# Patient Record
Sex: Female | Born: 1965
Health system: Southern US, Community
[De-identification: ages and names within clinical notes are randomized; demographics above are authoritative.]

## PROBLEM LIST (undated history)

## (undated) DIAGNOSIS — F329 Major depressive disorder, single episode, unspecified: Secondary | ICD-10-CM

## (undated) DIAGNOSIS — F32A Depression, unspecified: Secondary | ICD-10-CM

## (undated) DIAGNOSIS — D649 Anemia, unspecified: Secondary | ICD-10-CM

## (undated) DIAGNOSIS — I119 Hypertensive heart disease without heart failure: Secondary | ICD-10-CM

## (undated) DIAGNOSIS — I2129 ST elevation (STEMI) myocardial infarction involving other sites: Secondary | ICD-10-CM

## (undated) DIAGNOSIS — K5090 Crohn's disease, unspecified, without complications: Secondary | ICD-10-CM

## (undated) DIAGNOSIS — I10 Essential (primary) hypertension: Secondary | ICD-10-CM

## (undated) HISTORY — DX: Anemia, unspecified: D64.9

## (undated) HISTORY — DX: Essential (primary) hypertension: I10

## (undated) HISTORY — PX: RENAL ARTERY STENT: SHX2321

## (undated) HISTORY — PX: LAPAROSCOPY FOR ECTOPIC PREGNANCY: SUR765

## (undated) HISTORY — DX: Crohn's disease, unspecified, without complications: K50.90

---

## 1987-10-02 HISTORY — PX: TUBAL LIGATION: SHX77

## 2010-07-05 ENCOUNTER — Ambulatory Visit (HOSPITAL_BASED_OUTPATIENT_CLINIC_OR_DEPARTMENT_OTHER): Admission: RE | Admit: 2010-07-05 | Discharge: 2010-07-05 | Payer: Self-pay | Admitting: Unknown Physician Specialty

## 2010-07-05 ENCOUNTER — Ambulatory Visit: Payer: Self-pay | Admitting: Diagnostic Radiology

## 2010-10-01 DIAGNOSIS — I10 Essential (primary) hypertension: Secondary | ICD-10-CM

## 2010-10-01 HISTORY — DX: Essential (primary) hypertension: I10

## 2011-07-16 ENCOUNTER — Other Ambulatory Visit (HOSPITAL_BASED_OUTPATIENT_CLINIC_OR_DEPARTMENT_OTHER): Payer: Self-pay | Admitting: Unknown Physician Specialty

## 2011-07-16 DIAGNOSIS — Z1231 Encounter for screening mammogram for malignant neoplasm of breast: Secondary | ICD-10-CM

## 2011-07-19 ENCOUNTER — Ambulatory Visit (HOSPITAL_BASED_OUTPATIENT_CLINIC_OR_DEPARTMENT_OTHER)
Admission: RE | Admit: 2011-07-19 | Discharge: 2011-07-19 | Disposition: A | Discharge status: Home / Self Care | Payer: Self-pay | Source: Ambulatory Visit | Attending: Unknown Physician Specialty | Admitting: Unknown Physician Specialty

## 2011-07-19 DIAGNOSIS — Z1231 Encounter for screening mammogram for malignant neoplasm of breast: Secondary | ICD-10-CM | POA: Insufficient documentation

## 2011-10-02 DIAGNOSIS — D649 Anemia, unspecified: Secondary | ICD-10-CM

## 2011-10-02 HISTORY — PX: ENDOMETRIAL ABLATION: SHX621

## 2011-10-02 HISTORY — DX: Anemia, unspecified: D64.9

## 2011-10-17 ENCOUNTER — Encounter: Payer: Self-pay | Admitting: Obstetrics & Gynecology

## 2012-01-30 ENCOUNTER — Ambulatory Visit (INDEPENDENT_AMBULATORY_CARE_PROVIDER_SITE_OTHER): Payer: Self-pay | Admitting: Obstetrics & Gynecology

## 2012-01-30 ENCOUNTER — Other Ambulatory Visit (HOSPITAL_COMMUNITY)
Admission: RE | Admit: 2012-01-30 | Discharge: 2012-01-30 | Disposition: A | Discharge status: Home / Self Care | Payer: Self-pay | Source: Ambulatory Visit | Attending: Obstetrics & Gynecology | Admitting: Obstetrics & Gynecology

## 2012-01-30 ENCOUNTER — Encounter: Payer: Self-pay | Admitting: Obstetrics & Gynecology

## 2012-01-30 VITALS — BP 121/85 | HR 81 | Temp 97.5°F | Ht 67.0 in | Wt 159.0 lb

## 2012-01-30 DIAGNOSIS — N939 Abnormal uterine and vaginal bleeding, unspecified: Secondary | ICD-10-CM

## 2012-01-30 DIAGNOSIS — Z0000 Encounter for general adult medical examination without abnormal findings: Secondary | ICD-10-CM

## 2012-01-30 DIAGNOSIS — A499 Bacterial infection, unspecified: Secondary | ICD-10-CM

## 2012-01-30 DIAGNOSIS — N760 Acute vaginitis: Secondary | ICD-10-CM

## 2012-01-30 DIAGNOSIS — D219 Benign neoplasm of connective and other soft tissue, unspecified: Secondary | ICD-10-CM

## 2012-01-30 DIAGNOSIS — N920 Excessive and frequent menstruation with regular cycle: Secondary | ICD-10-CM

## 2012-01-30 DIAGNOSIS — Z01812 Encounter for preprocedural laboratory examination: Secondary | ICD-10-CM

## 2012-01-30 DIAGNOSIS — D259 Leiomyoma of uterus, unspecified: Secondary | ICD-10-CM

## 2012-01-30 DIAGNOSIS — N921 Excessive and frequent menstruation with irregular cycle: Secondary | ICD-10-CM

## 2012-01-30 DIAGNOSIS — B9689 Other specified bacterial agents as the cause of diseases classified elsewhere: Secondary | ICD-10-CM

## 2012-01-30 DIAGNOSIS — N926 Irregular menstruation, unspecified: Secondary | ICD-10-CM | POA: Insufficient documentation

## 2012-01-30 DIAGNOSIS — N898 Other specified noninflammatory disorders of vagina: Secondary | ICD-10-CM

## 2012-01-30 HISTORY — DX: Benign neoplasm of connective and other soft tissue, unspecified: D21.9

## 2012-01-30 HISTORY — DX: Abnormal uterine and vaginal bleeding, unspecified: N93.9

## 2012-01-30 LAB — CBC
HCT: 38.3 % (ref 36.0–46.0)
Hemoglobin: 12.4 g/dL (ref 12.0–15.0)
MCHC: 32.4 g/dL (ref 30.0–36.0)
MCV: 86.1 fL (ref 78.0–100.0)
Platelets: 403 10*3/uL — ABNORMAL HIGH (ref 150–400)
RBC: 4.45 MIL/uL (ref 3.87–5.11)
RDW: 13.7 % (ref 11.5–15.5)

## 2012-01-30 MED ORDER — MEDROXYPROGESTERONE ACETATE 10 MG PO TABS: 20.0000 mg | ORAL_TABLET | Freq: Every day | ORAL | Status: DC

## 2012-01-30 NOTE — Patient Instructions (Addendum)
Ms. Juliane Lack,  Thank you very much for coming in to today. Please do the following: 1. Get uterine ultrasound done 2. Start provera on the first day of your next menstrual period.   Please f/u to review biopsy and ultrasound results.   Drs. Dilynn Munroe and Anyanwu

## 2012-01-30 NOTE — Assessment & Plan Note (Signed)
See A/P for above.

## 2012-01-30 NOTE — Assessment & Plan Note (Addendum)
A: The patient has menorrhagia and and intermentstrual bleeding in the setting of uterine fibroids.  P: -await biopsy results -pelvic and transvaginal ultrasound to evaluate uterine lining and fibroids.  -check Hgb/Hct and TSH -Provera at beginning of next cycle to lighten bleeding -f/u in 1-2 months to review labs and studies and discuss treatment plan.

## 2012-01-30 NOTE — Assessment & Plan Note (Signed)
Vaginal discharge associated with itching. Wet prep done. Results pending.

## 2012-01-30 NOTE — Progress Notes (Signed)
Patient ID: Melody Gardner, female   DOB: 05-26-1966, 46 y.o.   MRN: [id number] Subjective:     Melody Gardner is a 46 y.o. woman who presents for evaluation of  irregular menses and uterine fibroids. She was diagnosed with uterine fibroids in 46/2012. Since then she hs been on iron therapy  She presents for evaluation today because of worsening cramping associated with intermenstrual bleeding for the past four months. Patient's last menstrual period was 01/20/2012.  Periods are regular every 28-30 days, lasting 5-10 days.  Dysmenorrhea:none. Cyclic symptoms include: bloating, pelvic pain and cramping all occur with intermenstraul spotting. Cramping is 8/10 in severity occuring in the bilateral lower abdomen and low back.  She last had her hemoglobin checked one month ago. To her knowledge she has never has her TSH checked. Her last pap as 11/2010. She has no history of abnormal pap smears. Current contraception: tubal ligation.History of infertility: no. History of abnormal Pap smear: no.  Past Medical History  Diagnosis Date  . Hypertension 2012  . Anemia 2013   Past Surgical History  Procedure Date  . Tubal ligation   . Renal artery stent   . Laparoscopy for ectopic pregnancy 1989    Review of Systems Pertinent items are noted in HPI.  Denies: fever, chills, weight loss, shortness of breath and palpitations. Admits: fatigue, presyncope.    Objective:    BP 121/85  Pulse 81  Temp(Src) 97.5 F (36.4 C) (Oral)  Ht 5' 7"  (1.702 m)  Wt 159 lb (72.122 kg)  BMI 24.90 kg/m2  LMP 01/20/2012 General appearance: alert, cooperative and no distress Neck: no adenopathy, no JVD, supple, symmetrical, trachea midline and thyroid not enlarged, symmetric, no tenderness/mass/nodules Lungs: clear to auscultation bilaterally Heart: regular rate and rhythm, S1, S2 normal, no murmur, click, rub or gallop Abdomen: soft, non-tender; bowel sounds normal; no masses,  no organomegaly and discomfort to  palapation over midline lower abdomen.  Pelvic: cervix normal in appearance, external genitalia normal, no adnexal masses or tenderness and thin white vaginal discharge noted on pelvic exam.  Neurologic: Grossly normal   Urine pregnancy test: negative.     After fully reviewing the procedure with the patient and written informed consent was obtained an endometrial biopsy was performed by Dr. Harolyn Rutherford.    ENDOMETRIAL BIOPSY     The indications for endometrial biopsy were reviewed.   Risks of the biopsy including cramping, bleeding, infection, uterine perforation, inadequate specimen and need for additional procedures  were discussed. The patient states she understands and agrees to undergo procedure today. Consent was signed. Time out was performed. Urine HCG was negative. A sterile speculum was placed in the patient's vagina and the cervix was prepped with Betadine. A single-toothed tenaculum was placed on the anterior lip of the cervix to stabilize it. The 3 mm pipelle was introduced into the endometrial cavity without difficulty to a depth of 10cm, and a moderate amount of tissue was obtained and sent to pathology. The instruments were removed from the patient's vagina. Minimal bleeding from the cervix was noted. The patient tolerated the procedure well. Routine post-procedure instructions were given to the patient. The patient will follow up to review the results and for further management.     Assessment and Plan:    The patient has menorrhagia and and intermentstrual bleeding in the setting of uterine fibroids..    Plan:   -await biopsy results -pelvic and transvaginal ultrasound to evaluate uterine lining and fibroids.  -check Hgb/Hct and TSH -  Provera at beginning of next cycle to lighten bleeding -f/u in 1-2 months to review labs and studies and discuss treatment plan. \  Agree with above note by Dr. Adrian Blackwater. I was present and performed the endometrial biopsy.  Verita Schneiders,  M.D. 01/30/2012 3:33 PM

## 2012-01-30 NOTE — Assessment & Plan Note (Signed)
See A/P for above

## 2012-01-31 ENCOUNTER — Encounter: Payer: Self-pay | Admitting: Obstetrics & Gynecology

## 2012-01-31 ENCOUNTER — Telehealth: Payer: Self-pay | Admitting: Obstetrics and Gynecology

## 2012-01-31 ENCOUNTER — Ambulatory Visit (HOSPITAL_COMMUNITY): Admission: RE | Admit: 2012-01-31 | Payer: Self-pay | Source: Ambulatory Visit

## 2012-01-31 DIAGNOSIS — N840 Polyp of corpus uteri: Secondary | ICD-10-CM | POA: Insufficient documentation

## 2012-01-31 HISTORY — DX: Polyp of corpus uteri: N84.0

## 2012-01-31 LAB — WET PREP, GENITAL: Trich, Wet Prep: NONE SEEN

## 2012-01-31 MED ORDER — METRONIDAZOLE 500 MG PO TABS: 500.0000 mg | ORAL_TABLET | Freq: Two times a day (BID) | ORAL | Status: AC

## 2012-01-31 NOTE — Progress Notes (Signed)
Called pt today @ 1120 and left message on her personal voice mail of test results and treatment indicated.  Pt was requested to call back with name and location of her pharmacy so that we may send the Rx as ordered.

## 2012-01-31 NOTE — Progress Notes (Signed)
Addended by: Verita Schneiders A on: 01/31/2012 11:15 AM   Modules accepted: Orders

## 2012-01-31 NOTE — Telephone Encounter (Signed)
Patient called and requested Flagyl for her BV to be called in to [facility] in [place] [phone]). Rx called in and patient notified and agreed.

## 2012-01-31 NOTE — Progress Notes (Signed)
Wet prep showed moderate clue cells consistent with BV.  Metronidazole will be called in for patient.

## 2012-02-05 ENCOUNTER — Ambulatory Visit (HOSPITAL_COMMUNITY)
Admission: RE | Admit: 2012-02-05 | Discharge: 2012-02-05 | Disposition: A | Discharge status: Home / Self Care | Payer: Self-pay | Source: Ambulatory Visit | Attending: Obstetrics & Gynecology | Admitting: Obstetrics & Gynecology

## 2012-02-05 DIAGNOSIS — N920 Excessive and frequent menstruation with regular cycle: Secondary | ICD-10-CM | POA: Insufficient documentation

## 2012-02-05 DIAGNOSIS — N938 Other specified abnormal uterine and vaginal bleeding: Secondary | ICD-10-CM | POA: Insufficient documentation

## 2012-02-05 DIAGNOSIS — D219 Benign neoplasm of connective and other soft tissue, unspecified: Secondary | ICD-10-CM

## 2012-02-05 DIAGNOSIS — N949 Unspecified condition associated with female genital organs and menstrual cycle: Secondary | ICD-10-CM | POA: Insufficient documentation

## 2012-02-05 DIAGNOSIS — D252 Subserosal leiomyoma of uterus: Secondary | ICD-10-CM | POA: Insufficient documentation

## 2012-02-08 ENCOUNTER — Ambulatory Visit: Payer: Self-pay | Admitting: Obstetrics & Gynecology

## 2012-03-13 ENCOUNTER — Encounter: Payer: Self-pay | Admitting: Obstetrics & Gynecology

## 2012-03-13 ENCOUNTER — Ambulatory Visit (INDEPENDENT_AMBULATORY_CARE_PROVIDER_SITE_OTHER): Payer: Self-pay | Admitting: Obstetrics & Gynecology

## 2012-03-13 VITALS — BP 123/88 | HR 77 | Temp 98.0°F | Ht 67.0 in | Wt 153.6 lb

## 2012-03-13 DIAGNOSIS — D219 Benign neoplasm of connective and other soft tissue, unspecified: Secondary | ICD-10-CM

## 2012-03-13 DIAGNOSIS — N920 Excessive and frequent menstruation with regular cycle: Secondary | ICD-10-CM

## 2012-03-13 DIAGNOSIS — D259 Leiomyoma of uterus, unspecified: Secondary | ICD-10-CM

## 2012-03-13 MED ORDER — IBUPROFEN 600 MG PO TABS: 600.0000 mg | ORAL_TABLET | Freq: Four times a day (QID) | ORAL | Status: AC | PRN

## 2012-03-13 MED ORDER — MEDROXYPROGESTERONE ACETATE 10 MG PO TABS: 20.0000 mg | ORAL_TABLET | Freq: Every day | ORAL | Status: DC

## 2012-03-13 NOTE — Patient Instructions (Addendum)
Endometrial Ablation Endometrial ablation removes the lining of the uterus (endometrium). It is usually a same day, outpatient treatment. Ablation helps avoid major surgery (such as a hysterectomy). A hysterectomy is removal of the cervix and uterus. Endometrial ablation has less risk and complications, has a shorter recovery period and is less expensive. After endometrial ablation, most women will have little or no menstrual bleeding. You may not keep your fertility. Pregnancy is no longer likely after this procedure but if you are pre-menopausal, you still need to use a reliable method of birth control following the procedure because pregnancy can occur. REASONS TO HAVE THE PROCEDURE MAY INCLUDE:  Heavy periods.   Bleeding that is causing anemia.   Anovulatory bleeding, very irregular, bleeding.   Bleeding submucous fibroids (on the lining inside the uterus) if they are smaller than 3 centimeters.  REASONS NOT TO HAVE THE PROCEDURE MAY INCLUDE:  You wish to have more children.   You have a pre-cancerous or cancerous problem. The cause of any abnormal bleeding must be diagnosed before having the procedure.   You have pain coming from the uterus.   You have a submucus fibroid larger than 3 centimeters.   You recently had a baby.   You recently had an infection in the uterus.   You have a severe retro-flexed, tipped uterus and cannot insert the instrument to do the ablation.   You had a Cesarean section or deep major surgery on the uterus.   The inner cavity of the uterus is too large for the endometrial ablation instrument.  RISKS AND COMPLICATIONS   Perforation of the uterus.   Bleeding.   Infection of the uterus, bladder or vagina.   Injury to surrounding organs.   Cutting the cervix.   An air bubble to the lung (air embolus).   Pregnancy following the procedure.   Failure of the procedure to help the problem requiring hysterectomy.   Decreased ability to diagnose  cancer in the lining of the uterus.  BEFORE THE PROCEDURE  The lining of the uterus must be tested to make sure there is no pre-cancerous or cancer cells present.   Medications may be given to make the lining of the uterus thinner.   Ultrasound may be used to evaluate the size and look for abnormalities of the uterus.   Future pregnancy is not desired.  PROCEDURE  There are different ways to destroy the lining of the uterus.   Resectoscope - radio frequency-alternating electric current is the most common one used.   Cryotherapy - freezing the lining of the uterus.   Heated Free Liquid - heated salt (saline) solution inserted into the uterus.   Microwave - uses high energy microwaves in the uterus.   Thermal Balloon - a catheter with a balloon tip is inserted into the uterus and filled with heated fluid.  Your caregiver will talk with you about the method used in this clinic. They will also instruct you on the pros and cons of the procedure. Endometrial ablation is performed along with a procedure called operative hysteroscopy. A narrow viewing tube is inserted through the birth canal (vagina) and through the cervix into the uterus. A tiny camera attached to the viewing tube (hysteroscope) allows the uterine cavity to be shown on a TV monitor during surgery. Your uterus is filled with a harmless liquid to make the procedure easier. The lining of the uterus is then removed. The lining can also be removed with a resectoscope which allows your surgeon  to cut away the lining of the uterus under direct vision. Usually, you will be able to go home within an hour after the procedure. HOME CARE INSTRUCTIONS   Do not drive for 24 hours.   No tampons, douching or intercourse for 2 weeks or until your caregiver approves.   Rest at home for 24 to 48 hours. You may then resume normal activities unless told differently by your caregiver.   Take your temperature two times a day for 4 days, and record  it.   Take any medications your caregiver has ordered, as directed.   Use some form of contraception if you are pre-menopausal and do not want to get pregnant.  Bleeding after the procedure is normal. It varies from light spotting and mildly watery to bloody discharge for 4 to 6 weeks. You may also have mild cramping. Only take over-the-counter or prescription medicines for pain, discomfort, or fever as directed by your caregiver. Do not use aspirin, as this may aggravate bleeding. Frequent urination during the first 24 hours is normal. You will not know how effective your surgery is until at least 3 months after the surgery. SEEK IMMEDIATE MEDICAL CARE IF:   Bleeding is heavier than a normal menstrual cycle.   An oral temperature above 102 F (38.9 C) develops.   You have increasing cramps or pains not relieved with medication or develop belly (abdominal) pain which does not seem to be related to the same area of earlier cramping and pain.   You are light headed, weak or have fainting episodes.   You develop pain in the shoulder strap areas.   You have chest or leg pain.   You have abnormal vaginal discharge.   You have painful urination.  Document Released: 07/27/2004 Document Revised: 09/06/2011 Document Reviewed: 10/25/2007 [facility] Patient Information 2012 [place].

## 2012-03-13 NOTE — Progress Notes (Signed)
History:  46 y.o. [name] here today for discussion about management of AUB. She had a benign endometrial biopsy on 01/30/12, and had an ultrasound that showed fibroids, normal endometrial stripe. Small amount of bleeding since biopsy, denies pain. She was using Provera during her period and wants a refill, also wants prescription-strength Ibuprofen.  The following portions of the patient's history were reviewed and updated as appropriate: allergies, current medications, past family history, past medical history, past social history, past surgical history and problem list.  Review of Systems:  Pertinent items are noted in HPI.  Objective:  Physical Exam Blood pressure 123/88, pulse 77, temperature 98 F (36.7 C), temperature source Oral, height 5' 7"  (1.702 m), weight 153 lb 9.6 oz (69.673 kg), last menstrual period 01/30/2012. Deferred  Labs and Imaging 01/30/2012 Endometrium, biopsy: BENIGN ENDOMETRIAL TYPE POLYP(S). THERE IS NO EVIDENCE OF HYPERPLASIA OR MALIGNANCY. TSH 1.7, Hgb 12.4  02/05/2012 TRANSABDOMINAL AND TRANSVAGINAL ULTRASOUND OF PELVIS  Findings: The uterus is mildly enlarged and lobular in contour, measuring 10.5 x 5.6 x 8.5 cm. There is a broad-based subserosal fibroid at the right fundus which measures 5.3 cm. There is a left fundal myometrial fibroid that measures 2.4 cm. The endometrial stripe is homogeneous and upper limits of normal in width, measuring 13 mm. Both ovaries have a normal size and appearance. The right ovary measures 2.4 x 1.6 x 2.0 cm, and the left ovary measures 2.8 x 1.9 x 2.1 cm. There are no adnexal masses or free pelvic fluid.   IMPRESSION: Fibroid uterus, as described above. The endometrial stripe is homogeneous and upper limits of normal in width.   Assessment & Plan:  Discussed management options for abnormal uterine bleeding including oral Provera, Depo Provera, Mirena IUD, endometrial ablation (Novasure/Hydrothermal Ablation/Thermachoice) or hysterectomy  as definitive surgical management.  Discussed risks and benefits of each method.  Patient desires endometrial ablation.  Differences between ablation modalities explained, recommended Hydrothermal Endometrial Ablation.  Risks of surgery were discussed with the patient including but not limited to: bleeding which may require transfusion; infection which may require antibiotics; injury to uterus leading to risk of injury to surrounding intraperitoneal organs, burn injury to vagina or other organs, need for additional procedures including laparoscopy or laparotomy, and other postoperative/anesthesia complications. Patient was informed that there is a high likelihood of success of controlling her symptoms; howeverabout 5% of patients may require further intervention.  All questions were answered.  She was told that she will be contacted by our surgical scheduler regarding the time and date of her surgery; routine preoperative instructions of having nothing to eat or drink after midnight on the day prior to surgery and also coming to the hospital 1 1/2 hours prior to her time of surgery were also emphasized.  She was told she may be called for a preoperative appointment about a week prior to surgery and will be given further preoperative instructions at that visit. Printed patient education handouts about the procedure were given to the patient to review at home.   She was prescribed Ibuprofena and Provera, bleeding precautions reviewed.

## 2012-03-21 ENCOUNTER — Encounter (HOSPITAL_COMMUNITY): Payer: Self-pay | Admitting: Pharmacist

## 2012-03-26 ENCOUNTER — Encounter (HOSPITAL_COMMUNITY)
Admission: RE | Admit: 2012-03-26 | Discharge: 2012-03-26 | Disposition: A | Discharge status: Home / Self Care | Payer: Self-pay | Source: Ambulatory Visit | Attending: Obstetrics & Gynecology | Admitting: Obstetrics & Gynecology

## 2012-03-26 ENCOUNTER — Encounter (HOSPITAL_COMMUNITY): Payer: Self-pay

## 2012-03-26 HISTORY — DX: Depression, unspecified: F32.A

## 2012-03-26 HISTORY — DX: Major depressive disorder, single episode, unspecified: F32.9

## 2012-03-26 LAB — CBC
Hemoglobin: 12.1 g/dL (ref 12.0–15.0)
MCH: 28.3 pg (ref 26.0–34.0)
MCHC: 32.6 g/dL (ref 30.0–36.0)
MCV: 86.9 fL (ref 78.0–100.0)
Platelets: 413 10*3/uL — ABNORMAL HIGH (ref 150–400)

## 2012-03-26 LAB — BASIC METABOLIC PANEL
Calcium: 9.8 mg/dL (ref 8.4–10.5)
Creatinine, Ser: 0.67 mg/dL (ref 0.50–1.10)
GFR calc non Af Amer: >90 mL/min (ref >90)
Glucose, Bld: 79 mg/dL (ref 70–99)
Sodium: 137 mEq/L (ref 135–145)

## 2012-03-26 NOTE — Patient Instructions (Addendum)
YOUR PROCEDURE IS SCHEDULED ON:03/31/12  [place] [facility]TR[facility] AT:9am  USE DESK PHONE AND DIAL [phone] TO INFORM us OF YOUR ARRIVAL  CALL [phone] IF YOU HAVE ANY QUESTIONS OR PROBLEMS PRIOR TO YOUR ARRIVAL.  REMEMBER: DO NOT EAT OR DRINK AFTER MIDNIGHT :Sunday   YOU MAY BRUSH YOUR TEETH THE MORNING OF SURGERY   TAKE THESE MEDICINES THE DAY OF SURGERY WITH SIP OF WATER:BP meds   DO NOT WEAR JEWELRY, EYE MAKEUP, LIPSTICK OR DARK FINGERNAIL POLISH DO NOT WEAR LOTIONS  DO NOT SHAVE FOR 2 HOURS PRIOR TO SURGERY  YOU WILL NOT BE ALLOWED TO [place].

## 2012-03-31 ENCOUNTER — Encounter (HOSPITAL_COMMUNITY)
Admission: RE | Disposition: A | Discharge status: Home / Self Care | Payer: Self-pay | Source: Ambulatory Visit | Attending: Obstetrics & Gynecology

## 2012-03-31 ENCOUNTER — Encounter (HOSPITAL_COMMUNITY): Payer: Self-pay | Admitting: Anesthesiology

## 2012-03-31 ENCOUNTER — Ambulatory Visit (HOSPITAL_COMMUNITY)
Admission: RE | Admit: 2012-03-31 | Discharge: 2012-03-31 | Disposition: A | Discharge status: Home / Self Care | Payer: Self-pay | Source: Ambulatory Visit | Attending: Obstetrics & Gynecology | Admitting: Obstetrics & Gynecology

## 2012-03-31 ENCOUNTER — Ambulatory Visit (HOSPITAL_COMMUNITY): Payer: Self-pay | Admitting: Anesthesiology

## 2012-03-31 DIAGNOSIS — Z01818 Encounter for other preprocedural examination: Secondary | ICD-10-CM | POA: Insufficient documentation

## 2012-03-31 DIAGNOSIS — Z9889 Other specified postprocedural states: Secondary | ICD-10-CM

## 2012-03-31 DIAGNOSIS — N938 Other specified abnormal uterine and vaginal bleeding: Secondary | ICD-10-CM | POA: Insufficient documentation

## 2012-03-31 DIAGNOSIS — D259 Leiomyoma of uterus, unspecified: Secondary | ICD-10-CM

## 2012-03-31 DIAGNOSIS — D219 Benign neoplasm of connective and other soft tissue, unspecified: Secondary | ICD-10-CM | POA: Diagnosis present

## 2012-03-31 DIAGNOSIS — N840 Polyp of corpus uteri: Secondary | ICD-10-CM | POA: Diagnosis present

## 2012-03-31 DIAGNOSIS — N939 Abnormal uterine and vaginal bleeding, unspecified: Secondary | ICD-10-CM | POA: Diagnosis present

## 2012-03-31 DIAGNOSIS — Z01812 Encounter for preprocedural laboratory examination: Secondary | ICD-10-CM | POA: Insufficient documentation

## 2012-03-31 DIAGNOSIS — N949 Unspecified condition associated with female genital organs and menstrual cycle: Secondary | ICD-10-CM | POA: Insufficient documentation

## 2012-03-31 DIAGNOSIS — N926 Irregular menstruation, unspecified: Secondary | ICD-10-CM

## 2012-03-31 HISTORY — DX: Other specified postprocedural states: Z98.890

## 2012-03-31 LAB — PREGNANCY, URINE: Preg Test, Ur: NEGATIVE

## 2012-03-31 SURGERY — DILATATION & CURETTAGE/HYSTEROSCOPY WITH HYDROTHERMAL ABLATION
Anesthesia: General | Site: Uterus | Wound class: Clean Contaminated

## 2012-03-31 MED ORDER — MEPERIDINE HCL 25 MG/ML IJ SOLN: 6.2500 mg | INTRAMUSCULAR | Status: DC | PRN

## 2012-03-31 MED ORDER — SODIUM CHLORIDE 0.9 % IR SOLN
Status: DC | PRN
  Administered 2012-03-31: 3000 mL

## 2012-03-31 MED ORDER — IBUPROFEN 600 MG PO TABS: 600.0000 mg | ORAL_TABLET | Freq: Four times a day (QID) | ORAL | Status: AC | PRN

## 2012-03-31 MED ORDER — FENTANYL CITRATE 0.05 MG/ML IJ SOLN
INTRAMUSCULAR | Status: AC
  Filled 2012-03-31: qty 2

## 2012-03-31 MED ORDER — LIDOCAINE HCL (CARDIAC) 20 MG/ML IV SOLN
INTRAVENOUS | Status: AC
  Filled 2012-03-31: qty 5

## 2012-03-31 MED ORDER — PROPOFOL 10 MG/ML IV EMUL
INTRAVENOUS | Status: DC | PRN
  Administered 2012-03-31: 200 mg via INTRAVENOUS

## 2012-03-31 MED ORDER — PROPOFOL 10 MG/ML IV EMUL
INTRAVENOUS | Status: AC
  Filled 2012-03-31: qty 20

## 2012-03-31 MED ORDER — MIDAZOLAM HCL 2 MG/2ML IJ SOLN
INTRAMUSCULAR | Status: AC
  Filled 2012-03-31: qty 2

## 2012-03-31 MED ORDER — FENTANYL CITRATE 0.05 MG/ML IJ SOLN
INTRAMUSCULAR | Status: DC | PRN
  Administered 2012-03-31 (×2): 50 ug via INTRAVENOUS

## 2012-03-31 MED ORDER — BUPIVACAINE HCL (PF) 0.5 % IJ SOLN
INTRAMUSCULAR | Status: AC
  Filled 2012-03-31: qty 30

## 2012-03-31 MED ORDER — OXYCODONE-ACETAMINOPHEN 5-325 MG PO TABS: 1.0000 | ORAL_TABLET | Freq: Four times a day (QID) | ORAL | Status: AC | PRN

## 2012-03-31 MED ORDER — OXYCODONE-ACETAMINOPHEN 5-325 MG PO TABS
1.0000 | ORAL_TABLET | ORAL | Status: DC | PRN
  Administered 2012-03-31: 1 via ORAL

## 2012-03-31 MED ORDER — ONDANSETRON HCL 4 MG/2ML IJ SOLN
INTRAMUSCULAR | Status: DC | PRN
  Administered 2012-03-31: 4 mg via INTRAVENOUS

## 2012-03-31 MED ORDER — OXYCODONE-ACETAMINOPHEN 5-325 MG PO TABS
ORAL_TABLET | ORAL | Status: AC
  Administered 2012-03-31: 1 via ORAL
  Filled 2012-03-31: qty 1

## 2012-03-31 MED ORDER — LIDOCAINE HCL (CARDIAC) 20 MG/ML IV SOLN
INTRAVENOUS | Status: DC | PRN
  Administered 2012-03-31: 50 mg via INTRAVENOUS

## 2012-03-31 MED ORDER — ONDANSETRON HCL 4 MG/2ML IJ SOLN
INTRAMUSCULAR | Status: AC
  Filled 2012-03-31: qty 2

## 2012-03-31 MED ORDER — KETOROLAC TROMETHAMINE 30 MG/ML IJ SOLN
INTRAMUSCULAR | Status: AC
  Filled 2012-03-31: qty 1

## 2012-03-31 MED ORDER — LACTATED RINGERS IV SOLN
INTRAVENOUS | Status: DC
  Administered 2012-03-31: 10:00:00 via INTRAVENOUS

## 2012-03-31 MED ORDER — FENTANYL CITRATE 0.05 MG/ML IJ SOLN
INTRAMUSCULAR | Status: AC
  Administered 2012-03-31: 50 ug via INTRAVENOUS
  Filled 2012-03-31: qty 2

## 2012-03-31 MED ORDER — DOCUSATE SODIUM 100 MG PO CAPS: 100.0000 mg | ORAL_CAPSULE | Freq: Two times a day (BID) | ORAL | Status: AC | PRN

## 2012-03-31 MED ORDER — BUPIVACAINE HCL 0.5 % IJ SOLN
INTRAMUSCULAR | Status: DC | PRN
  Administered 2012-03-31: 30 mL

## 2012-03-31 MED ORDER — FENTANYL CITRATE 0.05 MG/ML IJ SOLN
25.0000 ug | INTRAMUSCULAR | Status: DC | PRN
  Administered 2012-03-31: 50 ug via INTRAVENOUS

## 2012-03-31 MED ORDER — BUPIVACAINE HCL (PF) 0.25 % IJ SOLN
INTRAMUSCULAR | Status: AC
  Filled 2012-03-31: qty 30

## 2012-03-31 MED ORDER — METOCLOPRAMIDE HCL 5 MG/ML IJ SOLN: 10.0000 mg | Freq: Once | INTRAMUSCULAR | Status: DC | PRN

## 2012-03-31 MED ORDER — MIDAZOLAM HCL 5 MG/5ML IJ SOLN
INTRAMUSCULAR | Status: DC | PRN
  Administered 2012-03-31: 2 mg via INTRAVENOUS

## 2012-03-31 MED ORDER — KETOROLAC TROMETHAMINE 30 MG/ML IJ SOLN
INTRAMUSCULAR | Status: DC | PRN
  Administered 2012-03-31: 30 mg via INTRAVENOUS

## 2012-03-31 SURGICAL SUPPLY — 18 items
CATH ROBINSON RED A/P 16FR (CATHETERS) ×2 IMPLANT
CLOTH BEACON ORANGE TIMEOUT ST (SAFETY) ×2 IMPLANT
CONTAINER PREFILL 10% NBF 60ML (FORM) ×4 IMPLANT
DRAPE HYSTEROSCOPY (DRAPE) ×2 IMPLANT
GLOVE BIO SURGEON STRL SZ7 (GLOVE) ×8 IMPLANT
GLOVE BIOGEL PI IND STRL 6.5 (GLOVE) ×1 IMPLANT
GLOVE BIOGEL PI IND STRL 7.5 (GLOVE) ×1 IMPLANT
GLOVE BIOGEL PI INDICATOR 6.5 (GLOVE) ×1
GLOVE BIOGEL PI INDICATOR 7.5 (GLOVE) ×1
GOWN PREVENTION PLUS LG XLONG (DISPOSABLE) ×2 IMPLANT
GOWN STRL REIN XL XLG (GOWN DISPOSABLE) ×2 IMPLANT
NEEDLE SPNL 20GX3.5 QUINCKE YW (NEEDLE) ×2 IMPLANT
PACK VAGINAL MINOR WOMEN LF (CUSTOM PROCEDURE TRAY) ×2 IMPLANT
PAD OB MATERNITY 4.3X12.25 (PERSONAL CARE ITEMS) ×4 IMPLANT
SET GENESYS HTA PROCERVA (MISCELLANEOUS) ×2 IMPLANT
SYR CONTROL 10ML LL (SYRINGE) ×2 IMPLANT
TOWEL OR 17X24 6PK STRL BLUE (TOWEL DISPOSABLE) ×4 IMPLANT
WATER STERILE IRR 1000ML POUR (IV SOLUTION) ×2 IMPLANT

## 2012-03-31 NOTE — Op Note (Signed)
PREOPERATIVE DIAGNOSIS:  Abnormal uterine bleeding POSTOPERATIVE DIAGNOSIS: The same PROCEDURE: Dilation and Curettage, Hysteroscopy, Hydrothermal Endometrial Ablation SURGEON:  Dr. Verita Schneiders  INDICATIONS: 46 y.o. [id number] here for scheduled surgery for abnormal uterine bleeding. Risks of surgery were discussed with the patient including but not limited to: bleeding which may require transfusion; infection which may require antibiotics; injury to uterus leading to risk of injury to surrounding intraperitoneal organs, burn injury to vagina or other organs, need for additional procedures including laparoscopy or laparotomy, and other postoperative/anesthesia complications. Patient was informed that there is a high likelihood of success of controlling her symptoms; however about 5% of patients may require further intervention.  Written informed consent was obtained.    FINDINGS:  A 11 week size uterus.  Diffuse proliferative endometrium and polypoid tissue.  Normal ostia bilaterally.  ANESTHESIA:   General, paracervical block. INTRAVENOUS FLUIDS:  300 ml of LR ESTIMATED BLOOD LOSS:  10 ml SPECIMENS: Endometrial curettings sent to pathology COMPLICATIONS:  None immediate.  PROCEDURE DETAILS:  The patient was then taken to the operating room where general anesthesia was administered and was found to be adequate.  After an adequate timeout was performed, she was placed in the dorsal lithotomy position and examined; then prepped and draped in the sterile manner.   Her bladder was catheterized for clear, yellow urine. A speculum was then placed in the patient's vagina and a single tooth tenaculum was applied to the anterior lip of the cervix.   A paracervical block using 30 ml of 0.5% Marcaine was administered.  The cervix was sounded to 11 cm and dilated manually with metal dilators to accommodate the hydrothermal ablation hysteroscopic apparatus.  Once the cervix was dilated, a sharp curettage was then  performed to obtain a moderate amount of endometrial curettings.  The hysteroscope was inserted under direct visualization using normal saline as a suspension medium.  The uterine cavity was carefully examined, both ostia were recognized, and diffusely proliferative endometrium with polypoid tissue was noted.   The hydrothermal ablation was then carried out as per protocol.   Complete ablation of the endometrium was observed and the hysteroscope was removed under direct visualization. .  No complications were observed.  The tenaculum was removed from the anterior lip of the cervix, and the vaginal speculum was removed after noting good hemostasis.  The patient tolerated the procedure well and was taken to the recovery area awake, extubated and in stable condition.  The patient will be discharged to home as per PACU criteria.  Routine postoperative instructions given.  She was prescribed Percocet, Ibuprofen and Colace.  She will follow up in the clinic on 04/30/12  for postoperative evaluation.

## 2012-03-31 NOTE — H&P (View-Only) (Signed)
History:  46 y.o. [id number] here today for discussion about management of AUB. She had a benign endometrial biopsy on 01/30/12, and had an ultrasound that showed fibroids, normal endometrial stripe. Small amount of bleeding since biopsy, denies pain. She was using Provera during her period and wants a refill, also wants prescription-strength Ibuprofen.  The following portions of the patient's history were reviewed and updated as appropriate: allergies, current medications, past family history, past medical history, past social history, past surgical history and problem list.  Review of Systems:  Pertinent items are noted in HPI.  Objective:  Physical Exam Blood pressure 123/88, pulse 77, temperature 98 F (36.7 C), temperature source Oral, height 5' 7"  (1.702 m), weight 153 lb 9.6 oz (69.673 kg), last menstrual period 01/30/2012. Deferred  Labs and Imaging 01/30/2012 Endometrium, biopsy: BENIGN ENDOMETRIAL TYPE POLYP(S). THERE IS NO EVIDENCE OF HYPERPLASIA OR MALIGNANCY. TSH 1.7, Hgb 12.4  02/05/2012 TRANSABDOMINAL AND TRANSVAGINAL ULTRASOUND OF PELVIS  Findings: The uterus is mildly enlarged and lobular in contour, measuring 10.5 x 5.6 x 8.5 cm. There is a broad-based subserosal fibroid at the right fundus which measures 5.3 cm. There is a left fundal myometrial fibroid that measures 2.4 cm. The endometrial stripe is homogeneous and upper limits of normal in width, measuring 13 mm. Both ovaries have a normal size and appearance. The right ovary measures 2.4 x 1.6 x 2.0 cm, and the left ovary measures 2.8 x 1.9 x 2.1 cm. There are no adnexal masses or free pelvic fluid.   IMPRESSION: Fibroid uterus, as described above. The endometrial stripe is homogeneous and upper limits of normal in width.   Assessment & Plan:  Discussed management options for abnormal uterine bleeding including oral Provera, Depo Provera, Mirena IUD, endometrial ablation (Novasure/Hydrothermal Ablation/Thermachoice) or hysterectomy  as definitive surgical management.  Discussed risks and benefits of each method.  Patient desires endometrial ablation.  Differences between ablation modalities explained, recommended Hydrothermal Endometrial Ablation.  Risks of surgery were discussed with the patient including but not limited to: bleeding which may require transfusion; infection which may require antibiotics; injury to uterus leading to risk of injury to surrounding intraperitoneal organs, burn injury to vagina or other organs, need for additional procedures including laparoscopy or laparotomy, and other postoperative/anesthesia complications. Patient was informed that there is a high likelihood of success of controlling her symptoms; howeverabout 5% of patients may require further intervention.  All questions were answered.  She was told that she will be contacted by our surgical scheduler regarding the time and date of her surgery; routine preoperative instructions of having nothing to eat or drink after midnight on the day prior to surgery and also coming to the hospital 1 1/2 hours prior to her time of surgery were also emphasized.  She was told she may be called for a preoperative appointment about a week prior to surgery and will be given further preoperative instructions at that visit. Printed patient education handouts about the procedure were given to the patient to review at home.   She was prescribed Ibuprofena and Provera, bleeding precautions reviewed.

## 2012-03-31 NOTE — Anesthesia Postprocedure Evaluation (Signed)
Anesthesia Post Note  Patient: Melody Gardner  Procedure(s) Performed: Procedure(s) (LRB): DILATATION & CURETTAGE/HYSTEROSCOPY WITH HYDROTHERMAL ABLATION (N/A)  Anesthesia type: General  Patient location: PACU  Post pain: Pain level controlled  Post assessment: Post-op Vital signs reviewed  Last Vitals:  Filed Vitals:   03/31/12 1330  BP: 131/79  Pulse: 73  Temp:   Resp: 16    Post vital signs: Reviewed  Level of consciousness: sedated  Complications: No apparent anesthesia complicationsfj

## 2012-03-31 NOTE — Transfer of Care (Signed)
Immediate Anesthesia Transfer of Care Note  Patient: Melody Gardner  Procedure(s) Performed: Procedure(s) (LRB): DILATATION & CURETTAGE/HYSTEROSCOPY WITH HYDROTHERMAL ABLATION (N/A)  Patient Location: PACU  Anesthesia Type: General  Level of Consciousness: awake, alert  and oriented  Airway & Oxygen Therapy: Patient Spontanous Breathing and Patient connected to nasal cannula oxygen  Post-op Assessment: Report given to PACU RN and Post -op Vital signs reviewed and stable  Post vital signs: Reviewed and stable  Complications: No apparent anesthesia complications

## 2012-03-31 NOTE — Interval H&P Note (Signed)
History and Physical Interval Note  Melody Gardner  has presented today for surgery, with the diagnosis of abnormal uterine bleeding  The various methods of treatment have been discussed with the patient and family. After consideration of risks, benefits and other options for treatment, the patient has consented to  Procedure(s) (LRB): DILATATION & CURETTAGE/HYSTEROSCOPY WITH HYDROTHERMAL ABLATION as a surgical intervention .  The patient's history has been reviewed, patient examined, no change in status, stable for surgery.  I have reviewed the patients' chart and labs.  Questions were answered to the patient's satisfaction.  To the OR when ready.  Verita Schneiders, M.D. 03/31/2012 9:30 AM

## 2012-03-31 NOTE — Anesthesia Preprocedure Evaluation (Signed)
Anesthesia Evaluation  Patient identified by MRN, date of birth, ID band Patient awake    Reviewed: Allergy & Precautions, H&P , NPO status , Patient's Chart, lab work & pertinent test results  Airway Mallampati: II TM Distance: >3 FB Neck ROM: Full    Dental No notable dental hx. (+) Teeth Intact   Pulmonary  breath sounds clear to auscultation  Pulmonary exam normal       Cardiovascular hypertension, Pt. on medications Rhythm:Regular Rate:Normal  Hx/o Renal Artery Stenosis S/P stent placement   Neuro/Psych    GI/Hepatic negative GI ROS, Neg liver ROS,   Endo/Other  negative endocrine ROS  Renal/GU negative Renal ROS  negative genitourinary   Musculoskeletal negative musculoskeletal ROS (+)   Abdominal   Peds  Hematology negative hematology ROS (+)   Anesthesia Other Findings   Reproductive/Obstetrics negative OB ROS                           Anesthesia Physical Anesthesia Plan  ASA: II  Anesthesia Plan: General   Post-op Pain Management:    Induction: Intravenous  Airway Management Planned: LMA  Additional Equipment:   Intra-op Plan:   Post-operative Plan: Extubation in OR  Informed Consent: I have reviewed the patients History and Physical, chart, labs and discussed the procedure including the risks, benefits and alternatives for the proposed anesthesia with the patient or authorized representative who has indicated his/her understanding and acceptance.   Dental advisory given  Plan Discussed with: CRNA, Anesthesiologist and Surgeon  Anesthesia Plan Comments:         Anesthesia Quick Evaluation

## 2012-03-31 NOTE — Discharge Instructions (Signed)
Endometrial Ablation Endometrial ablation removes the lining of the uterus (endometrium). It is usually a same day, outpatient treatment. Ablation helps avoid major surgery (such as a hysterectomy). A hysterectomy is removal of the cervix and uterus. Endometrial ablation has less risk and complications, has a shorter recovery period and is less expensive. After endometrial ablation, most women will have little or no menstrual bleeding. You may not keep your fertility. Pregnancy is no longer likely after this procedure but if you are pre-menopausal, you still need to use a reliable method of birth control following the procedure because pregnancy can occur. REASONS TO HAVE THE PROCEDURE MAY INCLUDE:  Heavy periods.   Bleeding that is causing anemia.   Anovulatory bleeding, very irregular, bleeding.   Bleeding submucous fibroids (on the lining inside the uterus) if they are smaller than 3 centimeters.  REASONS NOT TO HAVE THE PROCEDURE MAY INCLUDE:  You wish to have more children.   You have a pre-cancerous or cancerous problem. The cause of any abnormal bleeding must be diagnosed before having the procedure.   You have pain coming from the uterus.   You have a submucus fibroid larger than 3 centimeters.   You recently had a baby.   You recently had an infection in the uterus.   You have a severe retro-flexed, tipped uterus and cannot insert the instrument to do the ablation.   You had a Cesarean section or deep major surgery on the uterus.   The inner cavity of the uterus is too large for the endometrial ablation instrument.  RISKS AND COMPLICATIONS   Perforation of the uterus.   Bleeding.   Infection of the uterus, bladder or vagina.   Injury to surrounding organs.   Cutting the cervix.   An air bubble to the lung (air embolus).   Pregnancy following the procedure.   Failure of the procedure to help the problem requiring hysterectomy.   Decreased ability to diagnose  cancer in the lining of the uterus.  PROCEDURE  Endometrial ablation is performed along with a procedure called operative hysteroscopy. A narrow viewing tube is inserted through the birth canal (vagina) and through the cervix into the uterus. A tiny camera attached to the viewing tube (hysteroscope) allows the uterine cavity to be shown on a TV monitor during surgery. Your uterus is filled with a harmless liquid to make the procedure easier. The lining of the uterus is then destroyed. Usually, you will be able to go home within an hour after the procedure. HOME CARE INSTRUCTIONS   Do not drive for 24 hours.   No tampons, douching or intercourse for 2 weeks or until your caregiver approves.   Rest at home for 24 to 48 hours. You may then resume normal activities unless told differently by your caregiver.   Take any medications your caregiver has ordered, as directed.   Use some form of contraception if you are pre-menopausal and do not want to get pregnant.  Bleeding after the procedure is normal. It varies from light spotting and mildly watery to bloody discharge for 4 to 6 weeks. You may also have mild cramping. Only take over-the-counter or prescription medicines for pain, discomfort, or fever as directed by your caregiver. Do not use aspirin, as this may aggravate bleeding. Frequent urination during the first 24 hours is normal. You will not know how effective your surgery is until at least 3 months after the surgery. SEEK IMMEDIATE MEDICAL CARE IF:   Bleeding is heavier than a  normal menstrual cycle.   An oral temperature above 102 F (38.9 C) develops.   You have increasing cramps or pains not relieved with medication or develop belly (abdominal) pain which does not seem to be related to the same area of earlier cramping and pain.   You are light headed, weak or have fainting episodes.   You develop pain in the shoulder strap areas.   You have chest or leg pain.   You have abnormal  vaginal discharge.   You have painful urination.  Document Released: 07/27/2004 Document Revised: 09/06/2011 Document Reviewed: 10/25/2007 ExitCare Patient Information 2012 ExitCare, LLCDISCHARGE INSTRUCTIONS: HYSTEROSCOPY / ENDOMETRIAL ABLATION The following instructions have been prepared to help you care for yourself upon your return home.  Personal hygiene: Marland Kitchen Use sanitary pads for vaginal drainage, not tampons. . Shower the day after your procedure. . NO tub baths, pools or Jacuzzis for 2-3 weeks. . Wipe front to back after using the bathroom.  Activity and limitations: . Do NOT drive or operate any equipment for 24 hours. The effects of anesthesia are still present and drowsiness may result. . Do NOT rest in bed all day. . Walking is encouraged. . Walk up and down stairs slowly. . You may resume your normal activity in one to two days or as indicated by your physician. Sexual activity: NO intercourse for at least 2 weeks after the procedure, or as indicated by your Doctor.  Diet: Eat a light meal as desired this evening. You may resume your usual diet tomorrow.  Return to Work: You may resume your work activities in one to two days or as indicated by Marine scientist.  What to expect after your surgery: Expect to have vaginal bleeding/discharge for 2-3 days and spotting for up to 10 days. It is not unusual to have soreness for up to 1-2 weeks. You may have a slight burning sensation when you urinate for the first day. Mild cramps may continue for a couple of days. You may have a regular period in 2-6 weeks.  Call your doctor for any of the following: . Excessive vaginal bleeding or clotting, saturating and changing one pad every hour. . Inability to urinate 6 hours after discharge from hospital. . Pain not relieved by pain medication. . Fever of 100.4 F or greater. . Unusual vaginal discharge or odor.  Return to office _________________Call for an appointment  ___________________ Patient's signature: ______________________ Nurse's signature ________________________  [facility] [place] [facility] [phone] .

## 2012-04-30 ENCOUNTER — Encounter: Payer: Self-pay | Admitting: Obstetrics & Gynecology

## 2012-04-30 ENCOUNTER — Ambulatory Visit (INDEPENDENT_AMBULATORY_CARE_PROVIDER_SITE_OTHER): Payer: Self-pay | Admitting: Obstetrics & Gynecology

## 2012-04-30 VITALS — BP 141/89 | HR 75 | Temp 97.9°F | Resp 12 | Ht 67.0 in | Wt 161.9 lb

## 2012-04-30 DIAGNOSIS — Z09 Encounter for follow-up examination after completed treatment for conditions other than malignant neoplasm: Secondary | ICD-10-CM

## 2012-04-30 NOTE — Progress Notes (Signed)
  Subjective:     Melody Gardner is a 46 y.o. female who presents to the clinic status post Dilation and Curettage, Hysteroscopy, Hydrothermal Endometrial Ablation for abnormal uterine bleeding on 03/31/12.  Eating a regular diet without difficulty. Bowel movements are normal. The patient is not having any pain.  The following portions of the patient's history were reviewed and updated as appropriate: allergies, current medications, past family history, past medical history, past social history, past surgical history and problem list.  Review of Systems Pertinent items are noted in HPI.    Objective:    BP 141/89  Pulse 75  Temp 97.9 F (36.6 C) (Oral)  Resp 12  Ht 5' 7"  (1.702 m)  Wt 161 lb 14.4 oz (73.437 kg)  BMI 25.36 kg/m2 General:  alert and no distress  Abdomen: soft, bowel sounds active, non-tender  Pelvic:   Deferred   03/31/12 Pathology Endometrium, curettage - SECRETORY PATTERN ENDOMETRIUM. - BENIGN MYOMETRIAL TISSUE PRESENT. - NO HYPERPLASIA, ATYPIA OR MALIGNANCY IDENTIFIED. - INCIDENTAL UNREMARKABLE SQUAMOUS MUCOSA.  Assessment:    Doing well postoperatively. Operative findings again reviewed. Pathology report discussed.    Plan:   1. Continue any current medications. 2. Activity restrictions: none 3. Anticipated return to work: now. 4. Follow up: as needed or for annual exam.

## 2012-04-30 NOTE — Progress Notes (Signed)
Patient ID: Melody Gardner, female   DOB: 01-Aug-1966, 46 y.o.   MRN: [id number]  Recheck BP - 140/95 pt states that she took BP meds this afternoon. She denies HA, blurred vision, changes in vision or dizziness.

## 2012-04-30 NOTE — Patient Instructions (Addendum)
Return to clinic for any scheduled appointments or for any gynecologic concerns as needed.   

## 2012-06-11 ENCOUNTER — Other Ambulatory Visit: Payer: Self-pay | Admitting: Obstetrics & Gynecology

## 2012-06-11 DIAGNOSIS — Z1231 Encounter for screening mammogram for malignant neoplasm of breast: Secondary | ICD-10-CM

## 2012-07-10 ENCOUNTER — Telehealth: Payer: Self-pay | Admitting: Medical

## 2012-07-10 NOTE — Telephone Encounter (Signed)
Had ablation in July. Started having bleeding and severe cramps for the last 2-3 days. I was able to get the patient an appointment for 07/14/12 @ 12:45 pm. I have instructed the patient that if her condition were to worsen, if she were to have severe pain not relieve by ibuprofen, start passing large clots or feeling extremely weak and dizzy then she should go to [facility] for evaluation at that time. The patient voiced understanding and stated that she was not passing clots at this time, but had earlier in the week and that she felt ok but was concerned about the pain. She said that she would go to [facility] if she needed to before her appointment on Monday. The patient did not voice any additional questions at this time.

## 2012-07-14 ENCOUNTER — Ambulatory Visit (INDEPENDENT_AMBULATORY_CARE_PROVIDER_SITE_OTHER): Payer: Self-pay | Admitting: Obstetrics & Gynecology

## 2012-07-14 ENCOUNTER — Encounter: Payer: Self-pay | Admitting: Obstetrics & Gynecology

## 2012-07-14 VITALS — BP 138/88 | HR 79 | Temp 97.4°F | Ht 64.0 in | Wt 164.5 lb

## 2012-07-14 DIAGNOSIS — D219 Benign neoplasm of connective and other soft tissue, unspecified: Secondary | ICD-10-CM

## 2012-07-14 DIAGNOSIS — N926 Irregular menstruation, unspecified: Secondary | ICD-10-CM

## 2012-07-14 DIAGNOSIS — Z9889 Other specified postprocedural states: Secondary | ICD-10-CM

## 2012-07-14 DIAGNOSIS — N939 Abnormal uterine and vaginal bleeding, unspecified: Secondary | ICD-10-CM

## 2012-07-14 DIAGNOSIS — D259 Leiomyoma of uterus, unspecified: Secondary | ICD-10-CM

## 2012-07-14 NOTE — Progress Notes (Signed)
History:  46 y.o. [id number] here today for discussion about heavy bleeding after undergoing HTA on 03/31/12.  She reports not having bleeding in 8/13 and 9/13 but on 07/09/12, she had a heavy period with small clots.  The heavy bleeding lasted for about 2 days, she used one pad every 4-5 hours. Not like her bleeding prior to the procedure.  The following portions of the patient's history were reviewed and updated as appropriate: allergies, current medications, past family history, past medical history, past social history, past surgical history and problem list.  Review of Systems:  Pertinent items are noted in HPI.  Objective:  Physical Exam Blood pressure 138/88, pulse 79, temperature 97.4 F (36.3 C), temperature source Oral, height 5' 4"  (1.626 m), weight 164 lb 8 oz (74.617 kg), last menstrual period 07/09/2012. Exam deferred  Assessment & Plan:  Patient was told that it was normal to have lighter periods after an ablation, she was told to call/come in for heavy bleeding (soaking 1 pad/hour), symptomatic anemia or other symptoms. She will get a mammogram this week, and will go to the cervical cancer screening opportunities for a pap smear soon.

## 2012-07-14 NOTE — Patient Instructions (Addendum)
Return to clinic for any scheduled appointments or for any gynecologic concerns as needed.   Call [phone] for free pap smear clinic

## 2012-07-21 ENCOUNTER — Ambulatory Visit (HOSPITAL_COMMUNITY)
Admission: RE | Admit: 2012-07-21 | Discharge: 2012-07-21 | Disposition: A | Discharge status: Home / Self Care | Payer: Self-pay | Source: Ambulatory Visit | Attending: Obstetrics & Gynecology | Admitting: Obstetrics & Gynecology

## 2012-07-21 DIAGNOSIS — Z1231 Encounter for screening mammogram for malignant neoplasm of breast: Secondary | ICD-10-CM

## 2013-08-26 ENCOUNTER — Ambulatory Visit: Payer: Self-pay | Admitting: Family Medicine

## 2013-10-07 ENCOUNTER — Ambulatory Visit (INDEPENDENT_AMBULATORY_CARE_PROVIDER_SITE_OTHER): Payer: No Typology Code available for payment source | Admitting: Obstetrics & Gynecology

## 2013-10-07 ENCOUNTER — Encounter: Payer: Self-pay | Admitting: Obstetrics & Gynecology

## 2013-10-07 VITALS — BP 134/91 | HR 84 | Temp 98.7°F | Ht 64.0 in | Wt 157.2 lb

## 2013-10-07 DIAGNOSIS — Z124 Encounter for screening for malignant neoplasm of cervix: Secondary | ICD-10-CM

## 2013-10-07 DIAGNOSIS — Z1151 Encounter for screening for human papillomavirus (HPV): Secondary | ICD-10-CM

## 2013-10-07 DIAGNOSIS — Z01419 Encounter for gynecological examination (general) (routine) without abnormal findings: Secondary | ICD-10-CM

## 2013-10-07 DIAGNOSIS — N898 Other specified noninflammatory disorders of vagina: Secondary | ICD-10-CM

## 2013-10-07 NOTE — Patient Instructions (Addendum)
Pap Test A Pap test is a procedure done in a clinic office to evaluate cells that are on the surface of the cervix. The cervix is the lower portion of the uterus and upper portion of the vagina. For some women, the cervical region has the potential to form cancer. With consistent evaluations by your caregiver, this type of cancer can be prevented.  If a Pap test is abnormal, it is most often a result of a previous exposure to human papillomavirus (HPV). HPV is a virus that can infect the cells of the cervix and cause dysplasia. Dysplasia is where the cells no longer look normal. If a woman has been diagnosed with high-grade or severe dysplasia, they are at higher risk of developing cervical cancer. People diagnosed with low-grade dysplasia should still be seen by their caregiver because there is a small chance that low-grade dysplasia could develop into cancer.  LET YOUR CAREGIVER KNOW ABOUT:  Recent sexually transmitted infection (STI) you have had.  Any new sex partners you have had.  History of previous abnormal Pap tests results.  History of previous cervical procedures you have had (colposcopy, biopsy, loop electrosurgical excision procedure [LEEP]).  Concerns you have had regarding unusual vaginal discharge.  History of pelvic pain.  Your use of birth control. BEFORE THE PROCEDURE  Ask your caregiver when to schedule your Pap test. It is best not to be on your period if your caregiver uses a wooden spatula to collect cells or applies cells to a glass slide. Newer techniques are not so sensitive to the timing of a menstrual cycle.  Do not douche or have sexual intercourse for 24 hours before the test.   Do not use vaginal creams or tampons for 24 hours before the test.   Empty your bladder just before the test to lessen any discomfort.  PROCEDURE You will lie on an exam table with your feet in stirrups. A warm metal or plastic instrument (speculum) is placed in your vagina. This  instrument allows your caregiver to see the inside of your vagina and look at your cervix. A small, plastic brush or wooden spatula is then used to collect cervical cells. These cells are placed in a lab specimen container. The cells are looked at under a microscope. A specialist will determine if the cells are normal.  AFTER THE PROCEDURE Make sure to get your test results.If your results come back abnormal, you may need further testing.  Document Released: 12/08/2002 Document Revised: 12/10/2011 Document Reviewed: 09/13/2011 [facility] Patient Information 2014 [place], [place]. Menorrhagia Dysfunctional uterine bleeding is different from a normal menstrual period. When periods are heavy or there is more bleeding than is usual for you, it is called menorrhagia. It may be caused by hormonal imbalance, or physical, metabolic, or other problems. Examination is necessary in order that your caregiver may treat treatable causes. If this is a continuing problem, a D&C may be needed. That means that the cervix (the opening of the uterus or womb) is dilated (stretched larger) and the lining of the uterus is scraped out. The tissue scraped out is then examined under a microscope by a specialist (pathologist) to make sure there is nothing of concern that needs further or more extensive treatment. HOME CARE INSTRUCTIONS   If medications were prescribed, take exactly as directed. Do not change or switch medications without consulting your caregiver.  Long term heavy bleeding may result in iron deficiency. Your caregiver may have prescribed iron pills. They help replace the iron  your body lost from heavy bleeding. Take exactly as directed. Iron may cause constipation. If this becomes a problem, increase the bran, fruits, and roughage in your diet.  Do not take aspirin or medicines that contain aspirin one week before or during your menstrual period. Aspirin may make the bleeding worse.  If you need to change your  sanitary pad or tampon more than once every 2 hours, stay in bed and rest as much as possible until the bleeding stops.  Eat well-balanced meals. Eat foods high in iron. Examples are leafy green vegetables, meat, liver, eggs, and whole grain breads and cereals. Do not try to lose weight until the abnormal bleeding has stopped and your blood iron level is back to normal. SEEK MEDICAL CARE IF:   You need to change your sanitary pad or tampon more than once an hour.  You develop nausea (feeling sick to your stomach) and vomiting, dizziness, or diarrhea while you are taking your medicine.  You have any problems that may be related to the medicine you are taking. SEEK IMMEDIATE MEDICAL CARE IF:   You have a fever.  You develop chills.  You develop severe bleeding or start to pass blood clots.  You feel dizzy or faint. MAKE SURE YOU:   Understand these instructions.  Will watch your condition.  Will get help right away if you are not doing well or get worse. Document Released: 09/17/2005 Document Revised: 12/10/2011 Document Reviewed: 03/08/2013 [facility] Patient Information 2014 [place]. Uterine Fibroid A uterine fibroid is a growth (tumor) that occurs in a woman's uterus. This type of tumor is not cancerous and does not spread out of the uterus. A woman can have one or many fibroids, and the fiboid(s) can become quite large. A fibroid can vary in size, weight, and where it grows in the uterus. Most fibroids do not require medical treatment, but some can cause pain or heavy bleeding during and between periods. CAUSES  A fibroid is the result of a single uterine cell that keeps growing (unregulated), which is different than most cells in the human body. Most cells have a control mechanism that keeps them from reproducing without control.  SYMPTOMS   Bleeding.  Pelvic pain and pressure.  Bladder problems due to the size of the fibroid.  Infertility and miscarriages depending  on the size and location of the fibroid. DIAGNOSIS  A diagnosis is made by physical exam. Your caregiver may feel the lumpy tumors during a pelvic exam. Important information regarding size, location, and number of tumors can be gained by having an ultrasound. It is rare that other tests, such as a CT scan or MRI, are needed. TREATMENT   Your caregiver may recommend watchful waiting. This involves getting the fibroid checked by your caregiver to see if the fibroids grow or shrink.   Hormonal treatment or an intrauterine device (IUD) may be prescribed.   Surgery may be needed to remove the fibroids (myomectomy) or the uterus (hysterectomy). This depends on your situation. When fibroids interfere with fertility and a woman wants to become pregnant, a caregiver may recommend having the fibroids removed.  [place] care depends on how you were treated. In general:   Keep all follow-up appointments with your caregiver.   Only take medicine as told by your caregiver. Do not take aspirin. It can cause bleeding.   If you have excessive periods and soak tampons or pads in a half hour or less, contact your caregiver  immediately. If your periods are troublesome but not so heavy, lie down with your feet raised slightly above your heart. Place cold packs on your lower abdomen.   If your periods are heavy, write down the number of pads or tampons you use per month. Bring this information to your caregiver.   Talk to your caregiver about taking iron pills.   Include green vegetables in your diet.   If you were prescribed a hormonal treatment, take the hormonal medicines as directed.   If you need surgery, ask your caregiver for information on your specific surgery.  SEEK IMMEDIATE MEDICAL CARE IF:  You have pelvic pain or cramps not controlled with medicines.   You have a sudden increase in pelvic pain.   You have an increase of bleeding between and during periods.    You feel lightheaded or have fainting episodes.  MAKE SURE YOU:  Understand these instructions.  Will watch your condition.  Will get help right away if you are not doing well or get worse. Document Released: 09/14/2000 Document Revised: 12/10/2011 Document Reviewed: 04/16/2013 [facility] Patient Information 2014 [place], [place].

## 2013-10-07 NOTE — Progress Notes (Signed)
Patient ID: Melody Gardner, female   DOB: 1965-11-22, 48 y.o.   MRN: [id number] Subjective:     Melody Gardner is a 48 y.o. female here for a routine exam.  No complaints.  She was using OTC vaginal soaps and creams but, she stopped.  She did not notice a discharge. S/p ablation in 2013   Gynecologic History Patient's last menstrual period was 09/25/2013.  Last Pap: before 2013. Results were: normal Last mammogram: 2014. Results were: normal  Obstetric History OB History  Gravida Para Term Preterm AB SAB TAB Ectopic Multiple Living  3 2 2  1   1  2     # Outcome Date GA Lbr Len/2nd Weight Sex Delivery Anes PTL Lv  3 TRM 05/15/87          2 TRM 01/20/86 [redacted]w[redacted]d        1 ECT             Obstetric Comments  01/20/86 pregnancy was twin pregnancy, one fetus did not develop.     The following portions of the patient's history were reviewed and updated as appropriate: allergies, current medications, past family history, past medical history, past social history, past surgical history and problem list.  Review of Systems A comprehensive review of systems was negative.    Objective:    BP 134/91  Pulse 84  Temp(Src) 98.7 F (37.1 C) (Oral)  Ht 5' 4"  (1.626 m)  Wt 157 lb 3.2 oz (71.305 kg)  BMI 26.97 kg/m2  LMP 09/25/2013  General Appearance:    Alert, cooperative, no distress, appears stated age              Throat:   Lips, mucosa, and tongue normal; teeth and gums normal  Neck:   Supple, symmetrical, trachea midline, no adenopathy;    thyroid:  no enlargement/tenderness/nodules; no carotid   bruit or JVD  Back:     Symmetric, no curvature, ROM normal, no CVA tenderness  Lungs:     Clear to auscultation bilaterally, respirations unlabored  Chest Wall:    No tenderness or deformity   Heart:    Regular rate and rhythm, S1 and S2 normal, no murmur, rub   or gallop  Breast Exam:    No tenderness, masses, or nipple abnormality  Abdomen:     Soft, non-tender, bowel sounds active  all four quadrants,    no masses, no organomegaly  Genitalia:    Normal female without lesion, discharge or tenderness there was a large amount of discharge- no vulvar skin changes noted     Extremities:   Extremities normal, atraumatic, no cyanosis or edema  Pulses:   2+ and symmetric all extremities  Skin:   Skin color, texture, turgor normal, no rashes or lesions  Lymph nodes:   Cervical, supraclavicular, and axillary nodes normal        2013 Clinical Data: Pain, history of fibroids, abnormal bleeding  TRANSABDOMINAL AND TRANSVAGINAL ULTRASOUND OF PELVIS  Technique: Both transabdominal and transvaginal ultrasound  examinations of the pelvis were performed. Transabdominal technique  was performed for global imaging of the pelvis including uterus,  ovaries, adnexal regions, and pelvic cul-de-sac.  Comparison: None.  It was necessary to proceed with endovaginal exam following the  transabdominal exam to visualize the endometrium and adnexa. .  Findings: The uterus is mildly enlarged and lobular in contour,  measuring 10.5 x 5.6 x 8.5 cm. There is a broad-based subserosal  fibroid at the right fundus which measures 5.3  cm. There is a left  fundal myometrial fibroid that measures 2.4 cm.  The endometrial stripe is homogeneous and upper limits of normal in  width, measuring 13 mm.  Both ovaries have a normal size and appearance. The right ovary  measures 2.4 x 1.6 x 2.0 cm, and the left ovary measures 2.8 x 1.9  x 2.1 cm. There are no adnexal masses or free pelvic fluid.  IMPRESSION:  Fibroid uterus, as described above.  The endometrial stripe is homogeneous and upper limits of normal in  width.   Assessment:    Healthy female exam.  Uterine fibroids- asymptomatic   Plan:    Follow up in: 1 year.   F/u PAP and wet smear

## 2013-10-08 ENCOUNTER — Other Ambulatory Visit: Payer: Self-pay | Admitting: Obstetrics & Gynecology

## 2013-10-08 DIAGNOSIS — B9689 Other specified bacterial agents as the cause of diseases classified elsewhere: Secondary | ICD-10-CM

## 2013-10-08 DIAGNOSIS — N760 Acute vaginitis: Secondary | ICD-10-CM

## 2013-10-08 LAB — WET PREP, GENITAL
Trich, Wet Prep: NONE SEEN
Yeast Wet Prep HPF POC: NONE SEEN

## 2013-10-08 MED ORDER — METRONIDAZOLE 500 MG PO TABS: 500.0000 mg | ORAL_TABLET | Freq: Two times a day (BID) | ORAL | Status: DC

## 2013-10-08 NOTE — Progress Notes (Signed)
Called pt and informed her of +BV requiring medication treatment.  She stated she has had BV in the past and had no questions. Pt voiced understanding and will obtain prescription.

## 2013-10-12 ENCOUNTER — Telehealth: Payer: Self-pay

## 2013-10-12 NOTE — Telephone Encounter (Signed)
Message copied by Michel Harrow on Mon Oct 12, 2013  9:06 AM ------      Message from: Lavonia Drafts      Created: Sat Oct 10, 2013 10:23 AM       Please call pt.  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  Needs repeat PAP in 1 years            Thx,      clh-S ------

## 2013-10-12 NOTE — Telephone Encounter (Signed)
Pt returned call and I informed her that her pap came back with + high risk for HPV and she will need to get a repeat PAP in one year.  Pt stated understanding.

## 2013-10-12 NOTE — Telephone Encounter (Signed)
Called pt and left message to return call to the clinics.

## 2014-08-02 ENCOUNTER — Encounter: Payer: Self-pay | Admitting: Obstetrics & Gynecology

## 2014-12-27 ENCOUNTER — Ambulatory Visit: Payer: No Typology Code available for payment source | Admitting: Medical

## 2015-01-03 ENCOUNTER — Ambulatory Visit: Payer: No Typology Code available for payment source | Admitting: Medical

## 2015-01-07 ENCOUNTER — Ambulatory Visit (INDEPENDENT_AMBULATORY_CARE_PROVIDER_SITE_OTHER): Payer: 59 | Admitting: Family Medicine

## 2015-01-07 ENCOUNTER — Encounter: Payer: Self-pay | Admitting: Family Medicine

## 2015-01-07 VITALS — BP 140/94 | HR 91 | Temp 99.1°F | Ht 64.5 in | Wt 165.0 lb

## 2015-01-07 DIAGNOSIS — J0110 Acute frontal sinusitis, unspecified: Secondary | ICD-10-CM

## 2015-01-07 DIAGNOSIS — Z0000 Encounter for general adult medical examination without abnormal findings: Secondary | ICD-10-CM | POA: Diagnosis not present

## 2015-01-07 DIAGNOSIS — E785 Hyperlipidemia, unspecified: Secondary | ICD-10-CM

## 2015-01-07 DIAGNOSIS — I10 Essential (primary) hypertension: Secondary | ICD-10-CM

## 2015-01-07 LAB — CBC WITH DIFFERENTIAL/PLATELET
BASOS ABS: 0 10*3/uL (ref 0.0–0.1)
BASOS PCT: 0 % (ref 0–1)
EOS PCT: 1 % (ref 0–5)
Eosinophils Absolute: 0.1 10*3/uL (ref 0.0–0.7)
HCT: 41.6 % (ref 36.0–46.0)
Hemoglobin: 13.7 g/dL (ref 12.0–15.0)
LYMPHS PCT: 15 % (ref 12–46)
Lymphs Abs: 1.3 10*3/uL (ref 0.7–4.0)
MCH: 28.9 pg (ref 26.0–34.0)
MCHC: 32.9 g/dL (ref 30.0–36.0)
MCV: 87.8 fL (ref 78.0–100.0)
MPV: 9.1 fL (ref 8.6–12.4)
Monocytes Absolute: 0.5 10*3/uL (ref 0.1–1.0)
Monocytes Relative: 6 % (ref 3–12)
NEUTROS ABS: 6.9 10*3/uL (ref 1.7–7.7)
Neutrophils Relative %: 78 % — ABNORMAL HIGH (ref 43–77)
PLATELETS: 400 10*3/uL (ref 150–400)
RBC: 4.74 MIL/uL (ref 3.87–5.11)
RDW: 12.4 % (ref 11.5–15.5)
WBC: 8.9 10*3/uL (ref 4.0–10.5)

## 2015-01-07 LAB — LIPID PANEL
Cholesterol: 131 mg/dL (ref 0–200)
HDL: 48 mg/dL (ref >=46)
LDL Cholesterol: 72 mg/dL (ref 0–99)
Total CHOL/HDL Ratio: 2.7 Ratio
Triglycerides: 56 mg/dL (ref <150)
VLDL: 11 mg/dL (ref 0–40)

## 2015-01-07 LAB — BASIC METABOLIC PANEL
BUN: 13 mg/dL (ref 6–23)
CALCIUM: 9.4 mg/dL (ref 8.4–10.5)
CO2: 26 mEq/L (ref 19–32)
Chloride: 104 mEq/L (ref 96–112)
Creat: 0.81 mg/dL (ref 0.50–1.10)
GLUCOSE: 95 mg/dL (ref 70–99)
Potassium: 3.5 mEq/L (ref 3.5–5.3)
Sodium: 139 mEq/L (ref 135–145)

## 2015-01-07 LAB — POCT URINALYSIS DIPSTICK
BILIRUBIN UA: NEGATIVE
Glucose, UA: NEGATIVE
KETONES UA: NEGATIVE
LEUKOCYTES UA: NEGATIVE
Nitrite, UA: NEGATIVE
Protein, UA: NEGATIVE
Spec Grav, UA: 1.02
Urobilinogen, UA: 4
pH, UA: 7

## 2015-01-07 LAB — HEPATIC FUNCTION PANEL
ALT: 13 U/L (ref 0–35)
AST: 19 U/L (ref 0–37)
Albumin: 4.1 g/dL (ref 3.5–5.2)
Alkaline Phosphatase: 75 U/L (ref 39–117)
BILIRUBIN INDIRECT: 0.4 mg/dL (ref 0.2–1.2)
Bilirubin, Direct: 0.1 mg/dL (ref 0.0–0.3)
Total Bilirubin: 0.5 mg/dL (ref 0.2–1.2)
Total Protein: 7.4 g/dL (ref 6.0–8.3)

## 2015-01-07 LAB — TSH: TSH: 1.28 u[IU]/mL (ref 0.350–4.500)

## 2015-01-07 MED ORDER — HYDROCHLOROTHIAZIDE 25 MG PO TABS: 25.0000 mg | ORAL_TABLET | Freq: Every day | ORAL | Status: DC

## 2015-01-07 MED ORDER — FLUTICASONE PROPIONATE 50 MCG/ACT NA SUSP: 2.0000 | Freq: Every day | NASAL | Status: DC

## 2015-01-07 MED ORDER — AMOXICILLIN-POT CLAVULANATE 875-125 MG PO TABS: 1.0000 | ORAL_TABLET | Freq: Two times a day (BID) | ORAL | Status: DC

## 2015-01-07 MED ORDER — AMLODIPINE BESYLATE 5 MG PO TABS: 5.0000 mg | ORAL_TABLET | Freq: Every day | ORAL | Status: DC

## 2015-01-07 NOTE — Progress Notes (Signed)
Pre visit review using our clinic review tool, if applicable. No additional management support is needed unless otherwise documented below in the visit note. 

## 2015-01-07 NOTE — Assessment & Plan Note (Signed)
Elevated Pt has not been taking her norvasc rto 3 months to recheck and cpe

## 2015-01-07 NOTE — Patient Instructions (Signed)

## 2015-01-07 NOTE — Progress Notes (Signed)
Patient ID: Melody Gardner, female    DOB: 04/16/1966  Age: 49 y.o. MRN: [id number]    Subjective:  Subjective HPI Melody Gardner presents establish and c/o sinus congestion --she has taken otc allergy med with no relief.   Symptoms x few months.     Review of Systems  Constitutional: Positive for chills. Negative for fever.  HENT: Positive for congestion, postnasal drip, rhinorrhea and sinus pressure.   Respiratory: Positive for cough and wheezing. Negative for choking, chest tightness and shortness of breath.   Cardiovascular: Negative for chest pain, palpitations and leg swelling.  Allergic/Immunologic: Negative for environmental allergies.  Psychiatric/Behavioral: Negative for hallucinations, behavioral problems, confusion, dysphoric mood, decreased concentration and agitation. The patient is not nervous/anxious and is not hyperactive.     History Past Medical History  Diagnosis Date  . Hypertension 2012  . Anemia 2013  . Depression     pt states she is depressed but not medically diagnosed  . Crohn's disease     She has past surgical history that includes Renal artery stent; Laparoscopy for ectopic pregnancy; Tubal ligation (1989); and Endometrial ablation (2013).   Her family history includes Cancer in her brother; Diabetes in her brother, father, and father; Hypertension in her mother; Neuropathy in her brother and father; Stroke in her father.She reports that she has never smoked. She has never used smokeless tobacco. She reports that she drinks alcohol. She reports that she does not use illicit drugs.  Current Outpatient Prescriptions on File Prior to Visit  Medication Sig Dispense Refill  . Multiple Vitamins-Calcium (ONE-A-DAY WOMENS PO) Take 1 tablet by mouth daily.     No current facility-administered medications on file prior to visit.     Objective:  Objective Physical Exam  Constitutional: She appears well-developed and well-nourished. No distress.  HENT:  Head:  Normocephalic and atraumatic.  Right Ear: Tympanic membrane normal.  Left Ear: Tympanic membrane normal.  Nose: Mucosal edema and rhinorrhea present. Right sinus exhibits maxillary sinus tenderness and frontal sinus tenderness. Left sinus exhibits maxillary sinus tenderness and frontal sinus tenderness.  Mouth/Throat: Uvula is midline and mucous membranes are normal. Posterior oropharyngeal erythema present. No oropharyngeal exudate.  Eyes: Conjunctivae and EOM are normal. Pupils are equal, round, and reactive to light.  Neck: Normal range of motion. Neck supple.  Cardiovascular: Normal rate, regular rhythm and normal heart sounds.   Pulmonary/Chest: Effort normal and breath sounds normal. No respiratory distress. She has no wheezes.  Lymphadenopathy:    She has no cervical adenopathy.   BP 140/94 mmHg  Pulse 91  Temp(Src) 99.1 F (37.3 C) (Oral)  Ht 5' 4.5" (1.638 m)  Wt 165 lb (74.844 kg)  BMI 27.90 kg/m2  SpO2 97% Wt Readings from Last 3 Encounters:  01/07/15 165 lb (74.844 kg)  10/07/13 157 lb 3.2 oz (71.305 kg)  07/14/12 164 lb 8 oz (74.617 kg)     Lab Results  Component Value Date   WBC 8.1 03/26/2012   HGB 12.1 03/26/2012   HCT 37.1 03/26/2012   PLT 413* 03/26/2012   GLUCOSE 79 03/26/2012   NA 137 03/26/2012   K 3.9 03/26/2012   CL 103 03/26/2012   CREATININE 0.67 03/26/2012   BUN 8 03/26/2012   CO2 25 03/26/2012   TSH 1.730 01/30/2012    Ms Digital Screening  07/23/2012   *RADIOLOGY REPORT*  Clinical Data: Screening.  DIGITAL BILATERAL SCREENING MAMMOGRAM WITH CAD  Comparison:  Previous exams.  Findings:  The breast tissue is heterogeneously  dense. No suspicious masses, architectural distortion, or calcifications are present.  Images were processed with CAD.  IMPRESSION: No mammographic evidence of malignancy.  A result letter of this screening mammogram will be mailed directly to the patient.  RECOMMENDATION: Screening mammogram in one year. (Code:SM-B-01Y)   BI-RADS CATEGORY 1:  Negative.   Original Report Authenticated By: Deniece Portela, M.D.     Assessment & Plan:  Plan I have discontinued Ms. Helinski's ibuprofen and metroNIDAZOLE. I have also changed her amLODipine and hydrochlorothiazide. Additionally, I am having her start on amoxicillin-clavulanate and fluticasone. Lastly, I am having her maintain her Multiple Vitamins-Calcium (ONE-A-DAY WOMENS PO) and mesalamine.  Meds ordered this encounter  Medications  . mesalamine (PENTASA) 500 MG CR capsule    Sig: Take 1,000 mg by mouth 4 (four) times daily.  Marland Kitchen amLODipine (NORVASC) 5 MG tablet    Sig: Take 1 tablet (5 mg total) by mouth daily.    Dispense:  90 tablet    Refill:  1  . hydrochlorothiazide (HYDRODIURIL) 25 MG tablet    Sig: Take 1 tablet (25 mg total) by mouth daily.    Dispense:  90 tablet    Refill:  1  . amoxicillin-clavulanate (AUGMENTIN) 875-125 MG per tablet    Sig: Take 1 tablet by mouth 2 (two) times daily.    Dispense:  20 tablet    Refill:  0  . fluticasone (FLONASE) 50 MCG/ACT nasal spray    Sig: Place 2 sprays into both nostrils daily.    Dispense:  16 g    Refill:  6    Problem List Items Addressed This Visit    HTN (hypertension)    Elevated Pt has not been taking her norvasc rto 3 months to recheck and cpe      Relevant Medications   amLODIpine (NORVASC) tablet   hydrochlorothiazide tablet   Other Relevant Orders   Basic metabolic panel   CBC with Differential/Platelet   Hepatic function panel   Lipid panel   POCT urinalysis dipstick   TSH    Other Visit Diagnoses    Routine health maintenance    -  Primary    Relevant Medications    amLODIpine (NORVASC) tablet    hydrochlorothiazide tablet    Other Relevant Orders    Basic metabolic panel    CBC with Differential/Platelet    Hepatic function panel    Lipid panel    POCT urinalysis dipstick    TSH    Hyperlipidemia        Relevant Medications    amLODIpine (NORVASC) tablet     hydrochlorothiazide tablet    Other Relevant Orders    Basic metabolic panel    CBC with Differential/Platelet    Hepatic function panel    Lipid panel    POCT urinalysis dipstick    TSH    Acute frontal sinusitis, recurrence not specified        Relevant Medications    AMOXICILLIN-POT CLAVULANATE 875-125 MG PO TABS    fluticasone (FLONASE) nasal spray       Follow-up: Return in about 3 months (around 04/08/2015), or if symptoms worsen or fail to improve, for cpe.  Garnet Koyanagi, DO

## 2015-01-12 ENCOUNTER — Telehealth: Payer: Self-pay | Admitting: Family Medicine

## 2015-01-12 NOTE — Telephone Encounter (Signed)
Caller name: Mckenzy Relation to pt: self Call back number: [phone] Pharmacy:  Reason for call:   Is requesting two separate notes from work. 4/9 and 4/10. The other notes needs to be 4/11-4/13.

## 2015-01-13 NOTE — Telephone Encounter (Signed)
Patient stated she was out from the 9-13 she said she has two jobs so she needed two work notes, she was out due to her sinus infection. She seen Dr.Lowne on Friday 01/07/15 and was not well enough to go back. Please advise    KP

## 2015-01-13 NOTE — Telephone Encounter (Signed)
Msg left to call the office for clarification since Dr.Lowne did not write her out of work.      KP

## 2015-01-13 NOTE — Telephone Encounter (Signed)
Ok to give notes

## 2015-01-13 NOTE — Telephone Encounter (Signed)
Noted printed and the patient is aware to pick them up.      KP

## 2015-07-10 ENCOUNTER — Other Ambulatory Visit: Payer: Self-pay | Admitting: Family Medicine

## 2015-07-19 ENCOUNTER — Encounter (HOSPITAL_BASED_OUTPATIENT_CLINIC_OR_DEPARTMENT_OTHER): Payer: Self-pay

## 2015-07-19 ENCOUNTER — Emergency Department (HOSPITAL_BASED_OUTPATIENT_CLINIC_OR_DEPARTMENT_OTHER)
Admission: EM | Admit: 2015-07-19 | Discharge: 2015-07-19 | Disposition: A | Discharge status: Home / Self Care | Payer: 59 | Attending: Emergency Medicine | Admitting: Emergency Medicine

## 2015-07-19 DIAGNOSIS — Z79899 Other long term (current) drug therapy: Secondary | ICD-10-CM | POA: Diagnosis not present

## 2015-07-19 DIAGNOSIS — R51 Headache: Secondary | ICD-10-CM | POA: Diagnosis present

## 2015-07-19 DIAGNOSIS — Z8659 Personal history of other mental and behavioral disorders: Secondary | ICD-10-CM | POA: Insufficient documentation

## 2015-07-19 DIAGNOSIS — Z8719 Personal history of other diseases of the digestive system: Secondary | ICD-10-CM | POA: Insufficient documentation

## 2015-07-19 DIAGNOSIS — I10 Essential (primary) hypertension: Secondary | ICD-10-CM | POA: Insufficient documentation

## 2015-07-19 DIAGNOSIS — Z862 Personal history of diseases of the blood and blood-forming organs and certain disorders involving the immune mechanism: Secondary | ICD-10-CM | POA: Insufficient documentation

## 2015-07-19 DIAGNOSIS — J0100 Acute maxillary sinusitis, unspecified: Secondary | ICD-10-CM | POA: Diagnosis not present

## 2015-07-19 MED ORDER — AMOXICILLIN-POT CLAVULANATE 500-125 MG PO TABS: 1.0000 | ORAL_TABLET | Freq: Three times a day (TID) | ORAL | Status: DC

## 2015-07-19 NOTE — ED Provider Notes (Signed)
CSN: [id number]     Arrival date & time 07/19/15  1453 History   First MD Initiated Contact with Patient 07/19/15 1546     Chief Complaint  Patient presents with  . Facial Pain     HPI Pt c/o sinus pressure and dizziness progressively worsening since Friday. Pt states feels similar to previous sinus infections, and that congestion is primarily on the righthand side. + cough with yellow sputum per patient. Past Medical History  Diagnosis Date  . Hypertension 2012  . Anemia 2013  . Depression     pt states she is depressed but not medically diagnosed  . Crohn's disease [facility])    Past Surgical History  Procedure Laterality Date  . Renal artery stent    . Laparoscopy for ectopic pregnancy    . Tubal ligation  1989  . Endometrial ablation  2013   Family History  Problem Relation Age of Onset  . Hypertension Mother   . Diabetes Father   . Cancer Brother   . Diabetes Brother   . Diabetes Father   . Stroke Father   . Neuropathy Father   . Neuropathy Brother    Social History  Substance Use Topics  . Smoking status: Never Smoker   . Smokeless tobacco: Never Used  . Alcohol Use: Yes     Comment: on occaison   OB History    Gravida Para Term Preterm AB TAB SAB Ectopic Multiple Living   3 2 2  1   1  2       Obstetric Comments   01/20/86 pregnancy was twin pregnancy, one fetus did not develop.     Review of Systems  All other systems reviewed and are negative  Allergies  Sulfa antibiotics  Home Medications   Prior to Admission medications   Medication Sig Start Date End Date Taking? Authorizing Provider  balsalazide (COLAZAL) 750 MG capsule Take 2,250 mg by mouth 3 (three) times daily.   Yes Historical Provider, MD  amLODipine (NORVASC) 5 MG tablet Take 1 tablet (5 mg total) by mouth daily. Office visit due now 07/11/15   Rosalita Chessman, DO  amoxicillin-clavulanate (AUGMENTIN) 500-125 MG tablet Take 1 tablet (500 mg total) by mouth every 8 (eight) hours. 07/19/15    Leonard Schwartz, MD  hydrochlorothiazide (HYDRODIURIL) 25 MG tablet Take 1 tablet (25 mg total) by mouth daily. 01/07/15   Rosalita Chessman, DO  Multiple Vitamins-Calcium (ONE-A-DAY WOMENS PO) Take 1 tablet by mouth daily.    Historical Provider, MD   BP 143/109 mmHg  Pulse 84  Temp(Src) 98.9 F (37.2 C) (Oral)  Resp 18  Ht 5' 7"  (1.702 m)  Wt 168 lb (76.204 kg)  BMI 26.31 kg/m2  SpO2 100%  LMP 07/15/2015 Physical Exam  Constitutional: She is oriented to person, place, and time. She appears well-developed and well-nourished. No distress.  HENT:  Head: Normocephalic and atraumatic.    Eyes: Pupils are equal, round, and reactive to light.  Neck: Normal range of motion.  Cardiovascular: Normal rate and intact distal pulses.   Pulmonary/Chest: No respiratory distress.  Abdominal: Normal appearance. She exhibits no distension.  Musculoskeletal: Normal range of motion.  Neurological: She is alert and oriented to person, place, and time. No cranial nerve deficit.  Skin: Skin is warm and dry. No rash noted.  Psychiatric: She has a normal mood and affect. Her behavior is normal.  Nursing note and vitals reviewed.   ED Course  Procedures (including critical care time) Labs  Review Labs Reviewed - No data to display      MDM   Final diagnoses:  Acute maxillary sinusitis, recurrence not specified        Leonard Schwartz, MD 07/19/15 1609

## 2015-07-19 NOTE — ED Notes (Signed)
C/o facial pain, head congestion and right ear ache x 4-5 days

## 2015-07-19 NOTE — Discharge Instructions (Signed)

## 2015-07-19 NOTE — ED Notes (Signed)
Pt c/o sinus pressure and dizziness progressively worsening since Friday.  Pt states feels similar to previous sinus infections, and that congestion is primarily on the righthand side.  + cough with yellow sputum per patient.

## 2015-08-09 ENCOUNTER — Other Ambulatory Visit: Payer: Self-pay | Admitting: Family Medicine

## 2015-10-04 ENCOUNTER — Other Ambulatory Visit: Payer: Self-pay | Admitting: Family Medicine

## 2015-11-15 ENCOUNTER — Other Ambulatory Visit: Payer: Self-pay | Admitting: Family Medicine

## 2015-11-15 NOTE — Telephone Encounter (Signed)
Please schedule this patient an apt.      KP

## 2015-11-21 NOTE — Telephone Encounter (Signed)
Left message for patient to call and schedule appointment with Dr. Etter Sjogren

## 2015-11-24 ENCOUNTER — Emergency Department (HOSPITAL_BASED_OUTPATIENT_CLINIC_OR_DEPARTMENT_OTHER): Payer: BLUE CROSS/BLUE SHIELD

## 2015-11-24 ENCOUNTER — Encounter (HOSPITAL_BASED_OUTPATIENT_CLINIC_OR_DEPARTMENT_OTHER): Payer: Self-pay | Admitting: *Deleted

## 2015-11-24 ENCOUNTER — Emergency Department (HOSPITAL_BASED_OUTPATIENT_CLINIC_OR_DEPARTMENT_OTHER)
Admission: EM | Admit: 2015-11-24 | Discharge: 2015-11-24 | Disposition: A | Discharge status: Home / Self Care | Payer: BLUE CROSS/BLUE SHIELD | Attending: Emergency Medicine | Admitting: Emergency Medicine

## 2015-11-24 DIAGNOSIS — Z79899 Other long term (current) drug therapy: Secondary | ICD-10-CM | POA: Insufficient documentation

## 2015-11-24 DIAGNOSIS — Z8659 Personal history of other mental and behavioral disorders: Secondary | ICD-10-CM | POA: Diagnosis not present

## 2015-11-24 DIAGNOSIS — M79672 Pain in left foot: Secondary | ICD-10-CM | POA: Insufficient documentation

## 2015-11-24 DIAGNOSIS — Z862 Personal history of diseases of the blood and blood-forming organs and certain disorders involving the immune mechanism: Secondary | ICD-10-CM | POA: Diagnosis not present

## 2015-11-24 DIAGNOSIS — I10 Essential (primary) hypertension: Secondary | ICD-10-CM | POA: Diagnosis not present

## 2015-11-24 DIAGNOSIS — K5090 Crohn's disease, unspecified, without complications: Secondary | ICD-10-CM | POA: Diagnosis not present

## 2015-11-24 MED ORDER — NAPROXEN 500 MG PO TABS: 500.0000 mg | ORAL_TABLET | Freq: Two times a day (BID) | ORAL | Status: DC

## 2015-11-24 NOTE — Discharge Instructions (Signed)
Take the prescribed medication as directed.  Continue to elevate your feet in the evenings to help with swelling.  May want to try shoe inserts as well. Follow-up with your primary care physician. Return to the ED for new or worsening symptoms.

## 2015-11-24 NOTE — ED Notes (Signed)
Left foot pain without injury x 1 week.

## 2015-11-24 NOTE — ED Provider Notes (Signed)
CSN: [id number]     Arrival date & time 11/24/15  1733 History   First MD Initiated Contact with Patient 11/24/15 1747     Chief Complaint  Patient presents with  . Foot Pain     (Consider location/radiation/quality/duration/timing/severity/associated sxs/prior Treatment) Patient is a 50 y.o. female presenting with lower extremity pain. The history is provided by the patient and medical records.  Foot Pain Associated symptoms include arthralgias.   50 year old female with history of hypertension, anemia, depression, Crohn's disease, presenting to the ED for dorsal left foot pain for the past week. Patient denies any injury, trauma, or falls. She states she notices the pain more when standing on the hard floor at work. She states pain gets better when she sits and elevates her feet in the evening. She has had some intermittent swelling and also resolves with elevating her feet. No fevers or chills. Patient states she does stand up for long hours throughout the day, has been wearing boots recently supposed to her supportive shoes.  Past Medical History  Diagnosis Date  . Hypertension 2012  . Anemia 2013  . Depression     pt states she is depressed but not medically diagnosed  . Crohn's disease [facility])    Past Surgical History  Procedure Laterality Date  . Renal artery stent    . Laparoscopy for ectopic pregnancy    . Tubal ligation  1989  . Endometrial ablation  2013   Family History  Problem Relation Age of Onset  . Hypertension Mother   . Diabetes Father   . Cancer Brother   . Diabetes Brother   . Diabetes Father   . Stroke Father   . Neuropathy Father   . Neuropathy Brother    Social History  Substance Use Topics  . Smoking status: Never Smoker   . Smokeless tobacco: Never Used  . Alcohol Use: Yes     Comment: on occaison   OB History    Gravida Para Term Preterm AB TAB SAB Ectopic Multiple Living   3 2 2  1   1  2       Obstetric Comments   01/20/86 pregnancy was  twin pregnancy, one fetus did not develop.     Review of Systems  Musculoskeletal: Positive for arthralgias.  All other systems reviewed and are negative.     Allergies  Sulfa antibiotics  Home Medications   Prior to Admission medications   Medication Sig Start Date End Date Taking? Authorizing Provider  amLODipine (NORVASC) 5 MG tablet TAKE 1 TABLET BY MOUTH DAILY 11/15/15   Alferd Apa Lowne, DO  amoxicillin-clavulanate (AUGMENTIN) 500-125 MG tablet Take 1 tablet (500 mg total) by mouth every 8 (eight) hours. 07/19/15   Leonard Schwartz, MD  balsalazide (COLAZAL) 750 MG capsule Take 2,250 mg by mouth 3 (three) times daily.    Historical Provider, MD  hydrochlorothiazide (HYDRODIURIL) 25 MG tablet TAKE 1 TABLET BY MOUTH EVERY DAY 11/15/15   Rosalita Chessman, DO  Multiple Vitamins-Calcium (ONE-A-DAY WOMENS PO) Take 1 tablet by mouth daily.    Historical Provider, MD   BP 125/103 mmHg  Pulse 82  Temp(Src) 99.3 F (37.4 C) (Oral)  Resp 20  Ht 5' 6"  (1.676 m)  Wt 76.204 kg  BMI 27.13 kg/m2  SpO2 100%  LMP 11/24/2015   Physical Exam  Constitutional: She is oriented to person, place, and time. She appears well-developed and well-nourished. No distress.  HENT:  Head: Normocephalic and atraumatic.  Mouth/Throat: Oropharynx is  clear and moist.  Eyes: Conjunctivae and EOM are normal. Pupils are equal, round, and reactive to light.  Neck: Normal range of motion. Neck supple.  Cardiovascular: Normal rate, regular rhythm and normal heart sounds.   Pulmonary/Chest: Effort normal and breath sounds normal. No respiratory distress. She has no wheezes.  Abdominal: Soft. Bowel sounds are normal. There is no tenderness. There is no guarding.  Musculoskeletal: Normal range of motion. She exhibits no edema.  Left foot normal in appearance, no bony deformities or swelling noted; full ROM of all ankle, foot, and all toes; DP pulse intact; normal sensation, ambulatory  Neurological: She is alert and  oriented to person, place, and time.  Skin: Skin is warm and dry. She is not diaphoretic.  Psychiatric: She has a normal mood and affect.  Nursing note and vitals reviewed.   ED Course  Procedures (including critical care time) Labs Review Labs Reviewed - No data to display  Imaging Review No results found. I have personally reviewed and evaluated these images and lab results as part of my medical decision-making.   EKG Interpretation None      MDM   Final diagnoses:  Right foot pain   50 year old female here with dorsal left foot pain for the past week. No injury or trauma. Her left foot is normal in appearance without bony deformity or swelling. Foot is neurovascularly intact. Where she localizes her pain is exactly where her shoe laces are in the boots she has been wearing recently-- suspect this may be why she is having pain in this area.  Do not feel emergent imaging indicated at this time.  D/c home with anti-inflammatories. Instructed to continue elevating her feet in the evenings to help with dependent swelling, may wish to try shoe inserts.  FU with PCP.  Discussed plan with patient, he/she acknowledged understanding and agreed with plan of care.  Return precautions given for new or worsening symptoms.  Larene Pickett, PA-C 11/24/15 1827  Tanna Furry, MD 12/02/15 [phone]

## 2015-12-14 DIAGNOSIS — K5080 Crohn's disease of both small and large intestine without complications: Secondary | ICD-10-CM

## 2015-12-14 HISTORY — DX: Crohn's disease of both small and large intestine without complications: K50.80

## 2015-12-15 ENCOUNTER — Other Ambulatory Visit: Payer: Self-pay | Admitting: Family Medicine

## 2015-12-15 NOTE — Telephone Encounter (Signed)
Patient will need to be seen before further refills will be given. Please have pt come in for an bp follow up.

## 2015-12-17 ENCOUNTER — Other Ambulatory Visit: Payer: Self-pay | Admitting: Family Medicine

## 2015-12-18 ENCOUNTER — Encounter (HOSPITAL_COMMUNITY)
Admission: EM | Disposition: A | Discharge status: Home / Self Care | Payer: Self-pay | Source: Home / Self Care | Attending: Cardiovascular Disease

## 2015-12-18 ENCOUNTER — Encounter (HOSPITAL_BASED_OUTPATIENT_CLINIC_OR_DEPARTMENT_OTHER): Payer: Self-pay | Admitting: *Deleted

## 2015-12-18 ENCOUNTER — Inpatient Hospital Stay (HOSPITAL_BASED_OUTPATIENT_CLINIC_OR_DEPARTMENT_OTHER)
Admission: EM | Admit: 2015-12-18 | Discharge: 2015-12-22 | DRG: 280 | Disposition: A | Discharge status: Home / Self Care | Payer: BLUE CROSS/BLUE SHIELD | Attending: Cardiovascular Disease | Admitting: Cardiovascular Disease

## 2015-12-18 ENCOUNTER — Emergency Department (HOSPITAL_BASED_OUTPATIENT_CLINIC_OR_DEPARTMENT_OTHER): Payer: BLUE CROSS/BLUE SHIELD

## 2015-12-18 DIAGNOSIS — D649 Anemia, unspecified: Secondary | ICD-10-CM | POA: Diagnosis present

## 2015-12-18 DIAGNOSIS — M79675 Pain in left toe(s): Secondary | ICD-10-CM | POA: Diagnosis present

## 2015-12-18 DIAGNOSIS — I2542 Coronary artery dissection: Secondary | ICD-10-CM | POA: Diagnosis present

## 2015-12-18 DIAGNOSIS — I2129 ST elevation (STEMI) myocardial infarction involving other sites: Secondary | ICD-10-CM | POA: Diagnosis present

## 2015-12-18 DIAGNOSIS — I110 Hypertensive heart disease with heart failure: Secondary | ICD-10-CM | POA: Diagnosis not present

## 2015-12-18 DIAGNOSIS — M7989 Other specified soft tissue disorders: Secondary | ICD-10-CM | POA: Diagnosis present

## 2015-12-18 DIAGNOSIS — I2510 Atherosclerotic heart disease of native coronary artery without angina pectoris: Secondary | ICD-10-CM | POA: Diagnosis present

## 2015-12-18 DIAGNOSIS — I119 Hypertensive heart disease without heart failure: Secondary | ICD-10-CM | POA: Diagnosis present

## 2015-12-18 DIAGNOSIS — Z888 Allergy status to other drugs, medicaments and biological substances status: Secondary | ICD-10-CM

## 2015-12-18 DIAGNOSIS — K5090 Crohn's disease, unspecified, without complications: Secondary | ICD-10-CM | POA: Diagnosis present

## 2015-12-18 DIAGNOSIS — Z79899 Other long term (current) drug therapy: Secondary | ICD-10-CM | POA: Diagnosis not present

## 2015-12-18 DIAGNOSIS — R072 Precordial pain: Secondary | ICD-10-CM | POA: Diagnosis present

## 2015-12-18 DIAGNOSIS — Z882 Allergy status to sulfonamides status: Secondary | ICD-10-CM

## 2015-12-18 DIAGNOSIS — Z8249 Family history of ischemic heart disease and other diseases of the circulatory system: Secondary | ICD-10-CM

## 2015-12-18 DIAGNOSIS — M79605 Pain in left leg: Secondary | ICD-10-CM | POA: Diagnosis present

## 2015-12-18 DIAGNOSIS — R079 Chest pain, unspecified: Secondary | ICD-10-CM | POA: Diagnosis not present

## 2015-12-18 HISTORY — DX: Hypertensive heart disease without heart failure: I11.9

## 2015-12-18 HISTORY — PX: CARDIAC CATHETERIZATION: SHX172

## 2015-12-18 HISTORY — DX: ST elevation (STEMI) myocardial infarction involving other sites: I21.29

## 2015-12-18 LAB — CREATININE, SERUM
CREATININE: 0.66 mg/dL (ref 0.44–1.00)
GFR calc non Af Amer: >60 mL/min (ref >60)

## 2015-12-18 LAB — URINALYSIS, ROUTINE W REFLEX MICROSCOPIC
Bilirubin Urine: NEGATIVE
GLUCOSE, UA: NEGATIVE mg/dL
HGB URINE DIPSTICK: NEGATIVE
Ketones, ur: NEGATIVE mg/dL
Leukocytes, UA: NEGATIVE
Nitrite: NEGATIVE
PH: 8 (ref 5.0–8.0)
Protein, ur: NEGATIVE mg/dL
SPECIFIC GRAVITY, URINE: 1.011 (ref 1.005–1.030)

## 2015-12-18 LAB — CBC
HCT: 38.4 % (ref 36.0–46.0)
HEMATOCRIT: 39.6 % (ref 36.0–46.0)
Hemoglobin: 12.7 g/dL (ref 12.0–15.0)
Hemoglobin: 12.2 g/dL (ref 12.0–15.0)
MCH: 28.5 pg (ref 26.0–34.0)
MCH: 28.4 pg (ref 26.0–34.0)
MCHC: 32.1 g/dL (ref 30.0–36.0)
MCHC: 31.8 g/dL (ref 30.0–36.0)
MCV: 89 fL (ref 78.0–100.0)
MCV: 89.3 fL (ref 78.0–100.0)
PLATELETS: 379 10*3/uL (ref 150–400)
Platelets: 397 10*3/uL (ref 150–400)
RBC: 4.45 MIL/uL (ref 3.87–5.11)
RBC: 4.3 MIL/uL (ref 3.87–5.11)
RDW: 13.4 % (ref 11.5–15.5)
RDW: 13.3 % (ref 11.5–15.5)
WBC: 7.6 10*3/uL (ref 4.0–10.5)
WBC: 8.5 10*3/uL (ref 4.0–10.5)

## 2015-12-18 LAB — BASIC METABOLIC PANEL
Anion gap: 11 (ref 5–15)
BUN: 12 mg/dL (ref 6–20)
CHLORIDE: 103 mmol/L (ref 101–111)
CO2: 24 mmol/L (ref 22–32)
Calcium: 9.6 mg/dL (ref 8.9–10.3)
Creatinine, Ser: 0.72 mg/dL (ref 0.44–1.00)
GFR calc Af Amer: >60 mL/min (ref >60)
GLUCOSE: 106 mg/dL — AB (ref 65–99)
POTASSIUM: 3.1 mmol/L — AB (ref 3.5–5.1)
Sodium: 138 mmol/L (ref 135–145)

## 2015-12-18 LAB — PREGNANCY, URINE: Preg Test, Ur: NEGATIVE

## 2015-12-18 LAB — MRSA PCR SCREENING: MRSA by PCR: NEGATIVE

## 2015-12-18 LAB — TROPONIN I
Troponin I: 0.75 ng/mL (ref <0.031)
Troponin I: 1.7 ng/mL (ref <0.031)

## 2015-12-18 LAB — APTT: aPTT: 30 seconds (ref 24–37)

## 2015-12-18 SURGERY — LEFT HEART CATH AND CORONARY ANGIOGRAPHY
Anesthesia: LOCAL

## 2015-12-18 MED ORDER — ONDANSETRON HCL 4 MG/2ML IJ SOLN
4.0000 mg | Freq: Once | INTRAMUSCULAR | Status: AC
  Administered 2015-12-18: 4 mg via INTRAVENOUS
  Filled 2015-12-18: qty 2

## 2015-12-18 MED ORDER — HEPARIN (PORCINE) IN NACL 100-0.45 UNIT/ML-% IJ SOLN
1000.0000 [IU]/h | INTRAMUSCULAR | Status: DC
  Administered 2015-12-18: 1000 [IU]/h via INTRAVENOUS
  Filled 2015-12-18: qty 250

## 2015-12-18 MED ORDER — HEPARIN SODIUM (PORCINE) 5000 UNIT/ML IJ SOLN
5000.0000 [IU] | Freq: Three times a day (TID) | INTRAMUSCULAR | Status: DC
  Administered 2015-12-19 – 2015-12-22 (×10): 5000 [IU] via SUBCUTANEOUS
  Filled 2015-12-18 (×10): qty 1

## 2015-12-18 MED ORDER — SODIUM CHLORIDE 0.9 % IV BOLUS (SEPSIS)
1000.0000 mL | Freq: Once | INTRAVENOUS | Status: AC
  Administered 2015-12-18: 1000 mL via INTRAVENOUS

## 2015-12-18 MED ORDER — FENTANYL CITRATE (PF) 100 MCG/2ML IJ SOLN
INTRAMUSCULAR | Status: AC
Start: 2015-12-18 — End: 2015-12-18
  Filled 2015-12-18: qty 2

## 2015-12-18 MED ORDER — HEPARIN SODIUM (PORCINE) 1000 UNIT/ML IJ SOLN
INTRAMUSCULAR | Status: DC | PRN
  Administered 2015-12-18: 3000 [IU] via INTRAVENOUS

## 2015-12-18 MED ORDER — SODIUM CHLORIDE 0.9 % IV SOLN
250.0000 mL | INTRAVENOUS | Status: DC | PRN
Start: 2015-12-18 — End: 2015-12-22

## 2015-12-18 MED ORDER — ACETAMINOPHEN 325 MG PO TABS
650.0000 mg | ORAL_TABLET | ORAL | Status: DC | PRN
  Administered 2015-12-18 – 2015-12-22 (×3): 650 mg via ORAL
  Filled 2015-12-18 (×3): qty 2

## 2015-12-18 MED ORDER — SODIUM CHLORIDE 0.9% FLUSH
3.0000 mL | Freq: Two times a day (BID) | INTRAVENOUS | Status: DC
  Administered 2015-12-18 – 2015-12-22 (×7): 3 mL via INTRAVENOUS

## 2015-12-18 MED ORDER — ONDANSETRON HCL 4 MG/2ML IJ SOLN
4.0000 mg | Freq: Four times a day (QID) | INTRAMUSCULAR | Status: DC | PRN
  Administered 2015-12-18 – 2015-12-19 (×2): 4 mg via INTRAVENOUS
  Filled 2015-12-18 (×2): qty 2

## 2015-12-18 MED ORDER — HEPARIN (PORCINE) IN NACL 2-0.9 UNIT/ML-% IJ SOLN
INTRAMUSCULAR | Status: DC | PRN
  Administered 2015-12-18: 1500 mL

## 2015-12-18 MED ORDER — SODIUM CHLORIDE 0.9% FLUSH: 3.0000 mL | INTRAVENOUS | Status: DC | PRN

## 2015-12-18 MED ORDER — MIDAZOLAM HCL 2 MG/2ML IJ SOLN
INTRAMUSCULAR | Status: AC
  Filled 2015-12-18: qty 2

## 2015-12-18 MED ORDER — IOHEXOL 350 MG/ML SOLN
100.0000 mL | Freq: Once | INTRAVENOUS | Status: AC | PRN
  Administered 2015-12-18: 100 mL via INTRAVENOUS

## 2015-12-18 MED ORDER — BALSALAZIDE DISODIUM 750 MG PO CAPS
2250.0000 mg | ORAL_CAPSULE | Freq: Three times a day (TID) | ORAL | Status: DC
  Administered 2015-12-19 – 2015-12-22 (×8): 2250 mg via ORAL
  Filled 2015-12-18 (×13): qty 3

## 2015-12-18 MED ORDER — NITROGLYCERIN 1 MG/10 ML FOR IR/CATH LAB
INTRA_ARTERIAL | Status: AC
  Filled 2015-12-18: qty 10

## 2015-12-18 MED ORDER — VERAPAMIL HCL 2.5 MG/ML IV SOLN
INTRAVENOUS | Status: AC
  Filled 2015-12-18: qty 2

## 2015-12-18 MED ORDER — SODIUM CHLORIDE 0.9 % IV SOLN: INTRAVENOUS | Status: AC

## 2015-12-18 MED ORDER — OXYCODONE-ACETAMINOPHEN 5-325 MG PO TABS
1.0000 | ORAL_TABLET | ORAL | Status: DC | PRN
  Filled 2015-12-18: qty 2

## 2015-12-18 MED ORDER — ASPIRIN 325 MG PO TABS
325.0000 mg | ORAL_TABLET | Freq: Once | ORAL | Status: AC
  Administered 2015-12-18: 325 mg via ORAL
  Filled 2015-12-18: qty 1

## 2015-12-18 MED ORDER — HEPARIN (PORCINE) IN NACL 2-0.9 UNIT/ML-% IJ SOLN
INTRAMUSCULAR | Status: AC
  Filled 2015-12-18: qty 1500

## 2015-12-18 MED ORDER — MORPHINE SULFATE (PF) 4 MG/ML IV SOLN
6.0000 mg | Freq: Once | INTRAVENOUS | Status: AC
  Administered 2015-12-18: 6 mg via INTRAVENOUS
  Filled 2015-12-18: qty 2

## 2015-12-18 MED ORDER — HEPARIN SODIUM (PORCINE) 1000 UNIT/ML IJ SOLN
INTRAMUSCULAR | Status: AC
  Filled 2015-12-18: qty 1

## 2015-12-18 MED ORDER — FENTANYL CITRATE (PF) 100 MCG/2ML IJ SOLN
INTRAMUSCULAR | Status: DC | PRN
  Administered 2015-12-18: 50 ug via INTRAVENOUS
  Administered 2015-12-18: 25 ug via INTRAVENOUS

## 2015-12-18 MED ORDER — MORPHINE SULFATE (PF) 2 MG/ML IV SOLN
2.0000 mg | INTRAVENOUS | Status: DC | PRN
  Administered 2015-12-19 (×2): 2 mg via INTRAVENOUS
  Filled 2015-12-18 (×3): qty 1

## 2015-12-18 MED ORDER — IOHEXOL 350 MG/ML SOLN
INTRAVENOUS | Status: DC | PRN
  Administered 2015-12-18: 90 mL via INTRAVENOUS

## 2015-12-18 MED ORDER — ASPIRIN 81 MG PO CHEW
81.0000 mg | CHEWABLE_TABLET | Freq: Every day | ORAL | Status: DC
  Administered 2015-12-19 – 2015-12-22 (×4): 81 mg via ORAL
  Filled 2015-12-18 (×5): qty 1

## 2015-12-18 MED ORDER — MIDAZOLAM HCL 2 MG/2ML IJ SOLN
INTRAMUSCULAR | Status: DC | PRN
  Administered 2015-12-18: 2 mg via INTRAVENOUS

## 2015-12-18 MED ORDER — ATORVASTATIN CALCIUM 20 MG PO TABS: 20.0000 mg | ORAL_TABLET | Freq: Every day | ORAL | Status: DC

## 2015-12-18 MED ORDER — BALSALAZIDE DISODIUM 750 MG PO CAPS: 2250.0000 mg | ORAL_CAPSULE | Freq: Three times a day (TID) | ORAL | Status: DC

## 2015-12-18 MED ORDER — LIDOCAINE HCL (PF) 1 % IJ SOLN
INTRAMUSCULAR | Status: DC | PRN
  Administered 2015-12-18: 3 mL

## 2015-12-18 MED ORDER — NITROGLYCERIN 0.4 MG SL SUBL
0.4000 mg | SUBLINGUAL_TABLET | SUBLINGUAL | Status: DC | PRN
  Administered 2015-12-18 – 2015-12-19 (×6): 0.4 mg via SUBLINGUAL
  Filled 2015-12-18 (×3): qty 1

## 2015-12-18 MED ORDER — HEPARIN BOLUS VIA INFUSION
4000.0000 [IU] | Freq: Once | INTRAVENOUS | Status: AC
  Administered 2015-12-18: 4000 [IU] via INTRAVENOUS

## 2015-12-18 MED ORDER — METOPROLOL TARTRATE 12.5 MG HALF TABLET
12.5000 mg | ORAL_TABLET | Freq: Two times a day (BID) | ORAL | Status: DC
  Administered 2015-12-18 – 2015-12-20 (×5): 12.5 mg via ORAL
  Filled 2015-12-18 (×5): qty 1

## 2015-12-18 MED ORDER — VERAPAMIL HCL 2.5 MG/ML IV SOLN
INTRAVENOUS | Status: DC | PRN
  Administered 2015-12-18: 19:00:00 via INTRA_ARTERIAL

## 2015-12-18 MED ORDER — POTASSIUM CHLORIDE 10 MEQ/100ML IV SOLN
10.0000 meq | INTRAVENOUS | Status: AC
  Administered 2015-12-18 – 2015-12-19 (×4): 10 meq via INTRAVENOUS
  Filled 2015-12-18 (×4): qty 100

## 2015-12-18 SURGICAL SUPPLY — 13 items
CATH INFINITI 5 FR JL3.5 (CATHETERS) ×2 IMPLANT
CATH INFINITI 5FR ANG PIGTAIL (CATHETERS) ×2 IMPLANT
CATH INFINITI JR4 5F (CATHETERS) ×2 IMPLANT
CATH LAUNCHER 5F EBU3.0 (CATHETERS) ×1 IMPLANT
CATHETER LAUNCHER 5F EBU3.0 (CATHETERS) ×2
DEVICE RAD COMP TR BAND LRG (VASCULAR PRODUCTS) ×2 IMPLANT
GLIDESHEATH SLEND SS 6F .021 (SHEATH) ×2 IMPLANT
KIT HEART LEFT (KITS) ×2 IMPLANT
PACK CARDIAC CATHETERIZATION (CUSTOM PROCEDURE TRAY) ×2 IMPLANT
SYR MEDRAD MARK V 150ML (SYRINGE) ×2 IMPLANT
TRANSDUCER W/STOPCOCK (MISCELLANEOUS) ×2 IMPLANT
TUBING CIL FLEX 10 FLL-RA (TUBING) ×2 IMPLANT
WIRE SAFE-T 1.5MM-J .035X260CM (WIRE) ×2 IMPLANT

## 2015-12-18 NOTE — Consult Note (Signed)
History and Physical  Patient ID: Margaree Sandhu MRN: [id number], SOB: Nov 16, 1965 50 y.o. Date of Encounter: 12/18/2015, 9:29 PM  Primary Physician: Garnet Koyanagi, DO Primary Cardiologist: new  Chief Complaint: chest pain  HPI: 50 y.o. female w/ PMHx significant for Crohn's disease and HTN who presented to [facility] on 12/18/2015 with complaints of chest pain. She is transferred from [facility] with concern for acute MI. The patient presented this afternoon with substernal chest discomfort, but unremarkable EKG. Chest pain continued, troponin was elevated, and EKG evolved to suggest lateral infarct. ST elevation is mild and not diagnostic for STEMI but with ongoing chest pain she is transferred emergently for cardiac catheterization with suspected lateral MI.   She has had chest and upper back pain yesterday, then chest pain recurred this afternoon, felt pressure-like. No dyspnea, nausea, or diaphoresis. On arrival complains of 4/10 chest pain. No history of cardiac disease. She has had controlled HTN. Started Humira for Crohn's disease a few days ago and hasn't felt well since then.   Past Medical History  Diagnosis Date  . Hypertension 2012  . Anemia 2013  . Depression     pt states she is depressed but not medically diagnosed  . Crohn's disease ([place])   . Acute MI, lateral wall, initial episode of care [facility]) 12/18/2015     Surgical History:  Past Surgical History  Procedure Laterality Date  . Renal artery stent    . Laparoscopy for ectopic pregnancy    . Tubal ligation  1989  . Endometrial ablation  2013     Home Meds: Prior to Admission medications   Medication Sig Start Date End Date Taking? Authorizing Provider  Adalimumab (HUMIRA PEN-CROHNS STARTER [place]) Inject into the skin.   Yes Historical Provider, MD  amLODipine (NORVASC) 5 MG tablet TAKE 1 TABLET BY MOUTH DAILY 11/15/15  Yes Alferd Apa Lowne, DO  balsalazide (COLAZAL) 750 MG capsule Take 2,250 mg by  mouth 3 (three) times daily.   Yes Historical Provider, MD  hydrochlorothiazide (HYDRODIURIL) 25 MG tablet TAKE 1 TABLET BY MOUTH EVERY DAY 12/15/15  Yes Rosalita Chessman, DO  Multiple Vitamins-Calcium (ONE-A-DAY WOMENS PO) Take 1 tablet by mouth daily.   Yes Historical Provider, MD    Allergies:  Allergies  Allergen Reactions  . Sulfa Antibiotics Rash    Social History   Social History  . Marital Status: Married    Spouse Name: N/A  . Number of Children: N/A  . Years of Education: N/A   Occupational History  . Not on file.   Social History Main Topics  . Smoking status: Never Smoker   . Smokeless tobacco: Never Used  . Alcohol Use: No  . Drug Use: No  . Sexual Activity: Yes    Birth Control/ Protection: Surgical   Other Topics Concern  . Not on file   Social History Narrative     Family History  Problem Relation Age of Onset  . Hypertension Mother   . Diabetes Father   . Cancer Brother   . Diabetes Brother   . Diabetes Father   . Stroke Father   . Neuropathy Father   . Neuropathy Brother     Review of Systems: General: negative for chills, fever, night sweats or weight changes.  ENT: negative for rhinorrhea or epistaxis Cardiovascular: see HPI Dermatological: negative for rash Respiratory: negative for cough or wheezing GI: negative for nausea, vomiting, diarrhea, bright red blood per rectum, melena,  or hematemesis GU: no hematuria, urgency, or frequency Neurologic: negative for visual changes, syncope, headache, or dizziness Heme: no easy bruising or bleeding Endo: negative for excessive thirst, thyroid disorder, or flushing Musculoskeletal: negative for joint pain or swelling, negative for myalgias All other systems reviewed and are otherwise negative except as noted above.  Physical Exam: Blood pressure 150/102, pulse 0, temperature 98.9 F (37.2 C), temperature source Oral, resp. rate 0, height 5' 4"  (1.626 m), weight 169 lb (76.658 kg), last  menstrual period 11/24/2015, SpO2 0 %. General: Well developed, well nourished, alert and oriented, in mild acute distress. HEENT: Normocephalic, atraumatic, sclera anicteric Neck: Supple. Carotids 2+ without bruits. JVP normal Lungs: Clear bilaterally to auscultation without wheezes, rales, or rhonchi. Breathing is unlabored. Heart: RRR with normal S1 and S2. No murmurs, rubs, or gallops appreciated. Abdomen: Soft, non-tender, non-distended with normoactive bowel sounds. No hepatomegaly. No rebound/guarding. No obvious abdominal masses. Back: No CVA tenderness Msk:  Strength and tone appear normal for age. Extremities: No clubbing, cyanosis, or edema.  Distal pedal pulses are 2+ and equal bilaterally. Neuro: CNII-XII intact, moves all extremities spontaneously. Psych:  Responds to questions appropriately with a normal affect. Skin: warm and dry without rash   Labs:   Lab Results  Component Value Date   WBC 8.5 12/18/2015   HGB 12.2 12/18/2015   HCT 38.4 12/18/2015   MCV 89.3 12/18/2015   PLT 379 12/18/2015    Recent Labs Lab 12/18/15 1435 12/18/15 2015  NA 138  --   K 3.1*  --   CL 103  --   CO2 24  --   BUN 12  --   CREATININE 0.72 0.66  CALCIUM 9.6  --   GLUCOSE 106*  --     Recent Labs  12/18/15 1430 12/18/15 2015  TROPONINI 0.75* 1.70*   Lab Results  Component Value Date   CHOL 131 01/07/2015   HDL 48 01/07/2015   LDLCALC 72 01/07/2015   TRIG 56 01/07/2015   No results found for: DDIMER  Radiology/Studies:  Ct Angio Chest Pe W/cm &/or Wo Cm  12/18/2015  CLINICAL DATA:  Pressure like central chest pain and shortness of breath today. EXAM: CT ANGIOGRAPHY CHEST WITH CONTRAST TECHNIQUE: Multidetector CT imaging of the chest was performed using the standard protocol during bolus administration of intravenous contrast. Multiplanar CT image reconstructions and MIPs were obtained to evaluate the vascular anatomy. CONTRAST:  160m OMNIPAQUE IOHEXOL 350 MG/ML SOLN  COMPARISON:  Chest radiographs dated 08/12/2013. FINDINGS: Mediastinum/Lymph Nodes: No pulmonary emboli or thoracic aortic dissection identified. No masses or pathologically enlarged lymph nodes identified. Small sliding hiatal hernia. Lungs/Pleura: Small calcified granuloma in the right upper lobe. Otherwise, no lung nodules and no infiltrates. No pleural fluid. Upper abdomen: Multiple splenic flexure diverticula. Musculoskeletal: Mild dextroconvex thoracic scoliosis. Review of the MIP images confirms the above findings. IMPRESSION: 1. No pulmonary emboli or acute abnormality. 2. Small sliding hiatal hernia. 3. Colonic diverticulosis. Electronically Signed   By: SClaudie ReveringM.D.   On: 12/18/2015 16:53     EKG: Sinus rhythm with mild ST elevation, T wave abnormality, suggestive of acute lateral infarct  CARDIAC STUDIES: Pending  ASSESSMENT AND PLAN:  1. Acute Lateral MI: emergent cath recommended. Risks, indications reviewed with patient, emergency implied consent obtained. Further plans pending cath results. Likely will have to stop Humira as this can potentially be related. Pt has received ASA and heparin prior to arrival here.   2. HTN: anticipate change antihypertensives  to beta-blocker, ACE post-MI  Further plans pending cath result.   Deatra James MD 12/18/2015, 9:29 PM

## 2015-12-18 NOTE — ED Notes (Signed)
[facility] MD activated STEMI -[facility] called and truck in route. MD back at bedside to speak with patient and family

## 2015-12-18 NOTE — Progress Notes (Signed)
Chaplain provided support, information and refreshments to family as they waited for information.  Chaplain appreciated Dr. Antionette Char patience, caring manner and finesse in explaining the complicated situation to the family. Escorted husband to [facility] waiting area and communicated to staff about wait time before family would be able to see patient.  Please call if f/u support is needed.    Melody Gardner [phone]     12/18/15 1925  Clinical Encounter Type  Visited With Family  Visit Type Initial;Follow-up;Social support  Referral From (STEMI)  Consult/Referral To Chaplain  Stress Factors  Family Stress Factors Lack of knowledge

## 2015-12-18 NOTE — Progress Notes (Signed)
ANTICOAGULATION CONSULT NOTE - Initial Consult  Pharmacy Consult for Heparin Indication: chest pain/ACS  Allergies  Allergen Reactions  . Sulfa Antibiotics Rash    Patient Measurements: Height: 5' 4"  (162.6 cm) Weight: 169 lb (76.658 kg) IBW/kg (Calculated) : 54.7 Heparin Dosing Weight: 70 kg  Vital Signs: Temp: 98.9 F (37.2 C) (03/19 1408) Temp Source: Oral (03/19 1408) BP: 126/94 mmHg (03/19 1628) Pulse Rate: 88 (03/19 1628)  Labs:  Recent Labs  12/18/15 1430 12/18/15 1435  HGB  --  12.7  HCT  --  39.6  PLT  --  397  CREATININE  --  0.72  TROPONINI 0.75*  --     Estimated Creatinine Clearance: 85.3 mL/min (by C-G formula based on Cr of 0.72).   Medical History: Past Medical History  Diagnosis Date  . Hypertension 2012  . Anemia 2013  . Depression     pt states she is depressed but not medically diagnosed  . Crohn's disease ([place])     Medications:  F/u med rec  Assessment:  50 y/o F presents to [facility] with chills, SOB, and CP.Reports beginning new med, Humira for Crohn's disease, on Thursday. Baseline CBC WNL  Abnormal Labs: Troponin 0.75 elevated, K=3.1 low.  Start IV heparin for CP/ACS.  Goal of Therapy:  Heparin level 0.3-0.7 units/ml Monitor platelets by anticoagulation protocol: Yes   Plan:  Heparin 4000 unit IV bolus Heparin infusion 1000 units/hr Daily HL and CBC   Wilbon Obenchain S. Alford Highland, PharmD, BCPS Clinical Staff Pharmacist Pager [phone]  Eilene Ghazi Stillinger 12/18/2015,4:32 PM

## 2015-12-18 NOTE — ED Notes (Signed)
Pt tearful, states she has chills; c/o SOB since yesterday with chest pain- O2 sats 100% HR 80s

## 2015-12-18 NOTE — ED Provider Notes (Addendum)
CSN: [id number]     Arrival date & time 12/18/15  1352 History  By signing my name below, I, Helane Gunther, attest that this documentation has been prepared under the direction and in the presence of Merrily Pew, MD. Electronically Signed: Helane Gunther, ED Scribe. 12/18/2015. 5:18 PM      Chief Complaint  Patient presents with  . Shortness of Breath  . Chest Pain   The history is provided by the patient and a relative. No language interpreter was used.   HPI Comments: Melody Gardner is a 50 y.o. female with a PMHx of Crohn's Disease, HTN, anemia, and depression who presents to the Emergency Department complaining of pounding, pressure-like, central chest pain and associated SOB onset today. Pt states it feels as though someone is jumping on her chest. She notes the pain began while she had a break at work. She notes she thought it was just gas, so she used the restroom, which did not alleviate the pain. She reports associated weakness, chills, lightheadedness, and loss of appetite. She states she had back pain yesterday, which subsided after she had a soft BM. She notes a PMHx of active Crohn's disease, for which she is on AZT, balsalazide, as well as being on blood pressure medication (HGT and Norvasc). She also notes that her left foot has been swelling unusually, which she has been treating with elevation and cold compresses, with mild relief. She notes her PCP has told her in the past that she should not take Naproxen, as this would exacerbate the Crohn's. Pt denies cough, fever, n/v, diaphoresis, and rash.  Pt is allergic to sulfa antibiotics and folic acid.  Past Medical History  Diagnosis Date  . Hypertension 2012  . Anemia 2013  . Depression     pt states she is depressed but not medically diagnosed  . Crohn's disease [facility])    Past Surgical History  Procedure Laterality Date  . Renal artery stent    . Laparoscopy for ectopic pregnancy    . Tubal ligation  1989  . Endometrial  ablation  2013   Family History  Problem Relation Age of Onset  . Hypertension Mother   . Diabetes Father   . Cancer Brother   . Diabetes Brother   . Diabetes Father   . Stroke Father   . Neuropathy Father   . Neuropathy Brother    Social History  Substance Use Topics  . Smoking status: Never Smoker   . Smokeless tobacco: Never Used  . Alcohol Use: No   OB History    Gravida Para Term Preterm AB TAB SAB Ectopic Multiple Living   3 2 2  1   1  2       Obstetric Comments   01/20/86 pregnancy was twin pregnancy, one fetus did not develop.     Review of Systems  Constitutional: Positive for chills and appetite change. Negative for fever.  Respiratory: Positive for shortness of breath. Negative for cough.   Cardiovascular: Positive for chest pain.  Gastrointestinal: Positive for diarrhea. Negative for nausea and vomiting.  Musculoskeletal: Positive for back pain.  Skin: Negative for rash.  Neurological: Positive for weakness and light-headedness.  All other systems reviewed and are negative.   Allergies  Sulfa antibiotics  Home Medications   Prior to Admission medications   Medication Sig Start Date End Date Taking? Authorizing Provider  Adalimumab (HUMIRA PEN-CROHNS STARTER [place]) Inject into the skin.   Yes Historical Provider, MD  amLODipine (NORVASC) 5 MG  tablet TAKE 1 TABLET BY MOUTH DAILY 11/15/15  Yes Alferd Apa Lowne, DO  balsalazide (COLAZAL) 750 MG capsule Take 2,250 mg by mouth 3 (three) times daily.   Yes Historical Provider, MD  hydrochlorothiazide (HYDRODIURIL) 25 MG tablet TAKE 1 TABLET BY MOUTH EVERY DAY 12/15/15  Yes Rosalita Chessman, DO  Multiple Vitamins-Calcium (ONE-A-DAY WOMENS PO) Take 1 tablet by mouth daily.   Yes Historical Provider, MD   BP 113/78 mmHg  Pulse 82  Temp(Src) 98.9 F (37.2 C) (Oral)  Resp 19  Ht 5' 4"  (1.626 m)  Wt 169 lb (76.658 kg)  BMI 28.99 kg/m2  SpO2 94%  LMP 11/24/2015 (Approximate) Physical Exam  Constitutional: She is  oriented to person, place, and time. She appears well-developed and well-nourished.  HENT:  Head: Normocephalic and atraumatic.  Eyes: Conjunctivae and EOM are normal. Right eye exhibits no discharge. Left eye exhibits no discharge.  Cardiovascular: Normal rate, regular rhythm and normal heart sounds.   Pulmonary/Chest: Effort normal and breath sounds normal. No respiratory distress. She exhibits tenderness (R parasternal border).  Abdominal: Soft. Bowel sounds are normal. There is no tenderness.  Musculoskeletal: Normal range of motion. She exhibits edema and tenderness.  Left foot: TTP and swelling, pain with passive ROM, intact plantar and dorsi flexion, no pain in the left calf  Neurological: She is alert and oriented to person, place, and time. Coordination normal.  Skin: Skin is warm and dry. No rash noted. She is not diaphoretic. No erythema.  Psychiatric: She has a normal mood and affect.  Nursing note and vitals reviewed.   ED Course  Procedures   CRITICAL CARE Performed by: Merrily Pew   Total critical care time: 40 minutes Critical care time was exclusive of separately billable procedures and treating other patients. Critical care was necessary to treat or prevent imminent or life-threatening deterioration. Critical care was time spent personally by me on the following activities: development of treatment plan with patient and/or surrogate as well as nursing, discussions with consultants, evaluation of patient's response to treatment, examination of patient, obtaining history from patient or surrogate, ordering and performing treatments and interventions, ordering and review of laboratory studies, ordering and review of radiographic studies, pulse oximetry and re-evaluation of patient's condition.   DIAGNOSTIC STUDIES: Oxygen Saturation is 100% on RA, normal by my interpretation.    COORDINATION OF CARE: 3:15 PM - Discussed plans to wait on diagnostic studies and to order  a CT scan. Will order something for pain and anti-nausea. Pt advised of plan for treatment and pt agrees.  4:40 PM- Discussed abnormal lab results and plans to consult with cardiology. Advised pt she may need to be admitted. Pt advised of plan for treatment and pt agrees.   Labs Review Labs Reviewed  BASIC METABOLIC PANEL - Abnormal; Notable for the following:    Potassium 3.1 (*)    Glucose, Bld 106 (*)    All other components within normal limits  TROPONIN I - Abnormal; Notable for the following:    Troponin I 0.75 (*)    All other components within normal limits  CBC  PREGNANCY, URINE  URINALYSIS, ROUTINE W REFLEX MICROSCOPIC (NOT AT [facility])  HEPARIN LEVEL (UNFRACTIONATED)  APTT    Imaging Review Ct Angio Chest Pe W/cm &/or Wo Cm  12/18/2015  CLINICAL DATA:  Pressure like central chest pain and shortness of breath today. EXAM: CT ANGIOGRAPHY CHEST WITH CONTRAST TECHNIQUE: Multidetector CT imaging of the chest was performed using the standard protocol  during bolus administration of intravenous contrast. Multiplanar CT image reconstructions and MIPs were obtained to evaluate the vascular anatomy. CONTRAST:  15m OMNIPAQUE IOHEXOL 350 MG/ML SOLN COMPARISON:  Chest radiographs dated 08/12/2013. FINDINGS: Mediastinum/Lymph Nodes: No pulmonary emboli or thoracic aortic dissection identified. No masses or pathologically enlarged lymph nodes identified. Small sliding hiatal hernia. Lungs/Pleura: Small calcified granuloma in the right upper lobe. Otherwise, no lung nodules and no infiltrates. No pleural fluid. Upper abdomen: Multiple splenic flexure diverticula. Musculoskeletal: Mild dextroconvex thoracic scoliosis. Review of the MIP images confirms the above findings. IMPRESSION: 1. No pulmonary emboli or acute abnormality. 2. Small sliding hiatal hernia. 3. Colonic diverticulosis. Electronically Signed   By: SClaudie ReveringM.D.   On: 12/18/2015 16:53   I have personally reviewed and evaluated these  images and lab results as part of my medical decision-making.   EKG Interpretation   Date/Time:  Sunday December 18 2015 16:47:31 EDT Ventricular Rate:  82 PR Interval:  176 QRS Duration: 99 QT Interval:  387 QTC Calculation: 452 R Axis:   37 Text Interpretation:  Sinus rhythm Probable left atrial enlargement  Borderline T abnormalities, anterior leads Confirmed by MDigestive Health Center Of Indiana PcMD, JCorene Cornea [phone] on 12/18/2015 4:59:31 PM       MDM   Final diagnoses:  Acute ST elevation myocardial infarction (STEMI) of lateral wall (HCC)   Acute onset of retrosternal chest pressure around 12:30 today. Associated with nausea, shows of breath and dizziness. History of hypertension but no other risk factors. She also has right-sided parasternal tenderness to palpation. Initial differential as ACS versus pulmonary embolus versus dissection. We'll start with Toradol fentanyl and basic labs with a CT scan. We'll also check a troponin.   Positive troponin, ongoing cp, heparin started and a repeat ECG at 1647 which showed new ST elevations (<119mon my interpretation) in I and aVL compared to first ECG with new ST depressions in inferior leads. Still with CP, d/w Dr. TiWynonia Lawmannd will make Code STEMI and [facility].     I personally performed the services described in this documentation, which was scribed in my presence. The recorded information has been reviewed and is accurate.     JaMerrily PewMD 12/18/15 [id number] 12/18/15 [id number]

## 2015-12-18 NOTE — ED Notes (Signed)
Pt reports sudden onset of chest pain and sob (pt speaking in full sentences) around 1230 today; pt tearful in triage. Reports beginning new med, Humira for Crohn's disease, on Thursday. Denies active flare.

## 2015-12-19 ENCOUNTER — Inpatient Hospital Stay (HOSPITAL_COMMUNITY): Payer: BLUE CROSS/BLUE SHIELD

## 2015-12-19 ENCOUNTER — Encounter (HOSPITAL_COMMUNITY): Payer: Self-pay | Admitting: Cardiovascular Disease

## 2015-12-19 DIAGNOSIS — I119 Hypertensive heart disease without heart failure: Secondary | ICD-10-CM

## 2015-12-19 DIAGNOSIS — I110 Hypertensive heart disease with heart failure: Secondary | ICD-10-CM

## 2015-12-19 DIAGNOSIS — I2542 Coronary artery dissection: Secondary | ICD-10-CM

## 2015-12-19 DIAGNOSIS — R079 Chest pain, unspecified: Secondary | ICD-10-CM

## 2015-12-19 HISTORY — DX: Hypertensive heart disease without heart failure: I11.9

## 2015-12-19 HISTORY — DX: Coronary artery dissection: I25.42

## 2015-12-19 LAB — BASIC METABOLIC PANEL
ANION GAP: 9 (ref 5–15)
CHLORIDE: 105 mmol/L (ref 101–111)
CO2: 23 mmol/L (ref 22–32)
Calcium: 8.6 mg/dL — ABNORMAL LOW (ref 8.9–10.3)
Creatinine, Ser: 0.66 mg/dL (ref 0.44–1.00)
GFR calc Af Amer: >60 mL/min (ref >60)
GFR calc non Af Amer: >60 mL/min (ref >60)
Glucose, Bld: 97 mg/dL (ref 65–99)
POTASSIUM: 3.9 mmol/L (ref 3.5–5.1)
SODIUM: 137 mmol/L (ref 135–145)

## 2015-12-19 LAB — ECHOCARDIOGRAM COMPLETE
Height: 64 in
Weight: 2779.56 oz

## 2015-12-19 LAB — LIPID PANEL
CHOL/HDL RATIO: 3.2 ratio
Cholesterol: 120 mg/dL (ref 0–200)
HDL: 38 mg/dL — AB (ref >40)
LDL Cholesterol: 73 mg/dL (ref 0–99)
Triglycerides: 46 mg/dL (ref <150)
VLDL: 9 mg/dL (ref 0–40)

## 2015-12-19 LAB — CBC
HCT: 36.6 % (ref 36.0–46.0)
HEMOGLOBIN: 11.4 g/dL — AB (ref 12.0–15.0)
MCH: 27.9 pg (ref 26.0–34.0)
MCHC: 31.1 g/dL (ref 30.0–36.0)
MCV: 89.7 fL (ref 78.0–100.0)
Platelets: 373 10*3/uL (ref 150–400)
RBC: 4.08 MIL/uL (ref 3.87–5.11)
RDW: 13.3 % (ref 11.5–15.5)
WBC: 7.8 10*3/uL (ref 4.0–10.5)

## 2015-12-19 LAB — TROPONIN I
Troponin I: 4.94 ng/mL (ref <0.031)
Troponin I: 6.43 ng/mL (ref <0.031)

## 2015-12-19 MED ORDER — MORPHINE SULFATE (PF) 4 MG/ML IV SOLN: 6.0000 mg | INTRAVENOUS | Status: DC | PRN

## 2015-12-19 MED ORDER — SODIUM CHLORIDE 0.9 % IV SOLN: INTRAVENOUS | Status: DC

## 2015-12-19 MED ORDER — LOSARTAN POTASSIUM 25 MG PO TABS
25.0000 mg | ORAL_TABLET | Freq: Every day | ORAL | Status: DC
  Administered 2015-12-19 – 2015-12-20 (×2): 25 mg via ORAL
  Filled 2015-12-19 (×2): qty 1

## 2015-12-19 MED FILL — Nitroglycerin IV Soln 100 MCG/ML in D5W: INTRA_ARTERIAL | Qty: 10 | Status: AC

## 2015-12-19 NOTE — Progress Notes (Signed)
MD Oval Linsey spoke with patient's GI physician and confirmed it is okay to give patient aspirin.  MD Oval Linsey requested today's dose be given to patient at this time.  Discussed with patient and patient agreed to same.

## 2015-12-19 NOTE — Progress Notes (Signed)
Patient c/o severe chest pain 10/10 at 0112 am.. EKG performed and NTG sub given x3, 5 minutes apart. Morphine 2 mg also given. Patient reported pain as pounding in her chest and an ache that radiated down the left arm. Dr. Wynonia Lawman was notified and new orders received to increase the morphine from 2 to 6 mg every 1 hour prn. An additional 2 mg morphine given and patient was able to fall asleep.

## 2015-12-19 NOTE — Progress Notes (Addendum)
Pharmacy advised balsalazide (Colazal) is not carried in our pharmacy.  Asked patient if a family member would be able to bring in medication from home, patient advised her spouse is bringing her medications.   Patient advised her GI physician told her to not take aspirin due to her Crohn's disease and patient is unsure if she should take scheduled aspirin.  Paged MD Oval Linsey to advise.

## 2015-12-19 NOTE — Telephone Encounter (Signed)
Left message for patient to call office and schedule follow up appt

## 2015-12-19 NOTE — Progress Notes (Signed)
  Echocardiogram 2D Echocardiogram has been performed.  Darlina Sicilian M 12/19/2015, 2:53 PM

## 2015-12-19 NOTE — Telephone Encounter (Signed)
Please schedule this patient an apt for HTN follow up please.     KP

## 2015-12-19 NOTE — Telephone Encounter (Signed)
Please advise if ok, per notes pt is still admitted to hospital.

## 2015-12-19 NOTE — Progress Notes (Signed)
PATIENT ID: [id number] with Crohn's disease and hypertension here with spontaneous coronary dissection.  INTERVAL HISTORY: Melody Gardner went for cardiac catheterization yesterday and was found to have a 100% D1 lesion and mild-moderate LV systolic dysfunction.   SUBJECTIVE: Melody Gardner had chest pain over night that has since resolved.  Today she has abdominal pain from heparin injections.    PHYSICAL EXAM Filed Vitals:   12/19/15 0600 12/19/15 0700 12/19/15 0730 12/19/15 0800  BP: 146/88 149/86  150/83  Pulse:      Temp:   98 F (36.7 C)   TempSrc:   Oral   Resp: 19 15  19   Height:      Weight:      SpO2: 100% 99%  99%   General:  Well-appearing.  NAD Neck: No JVD Lungs:  CTAB.  No crackles, rhonchi or wheezes. Heart:  RRR.  No m/r/g.  Normal S1/S2 Abdomen: Soft, NT, ND.  +BS Extremities:  WWP.  No edema   LABS: Lab Results  Component Value Date   TROPONINI 4.94* 12/19/2015   Results for orders placed or performed during the hospital encounter of 12/18/15 (from the past 24 hour(s))  Troponin I     Status: Abnormal   Collection Time: 12/18/15  2:30 PM  Result Value Ref Range   Troponin I 0.75 (HH) <0.031 ng/mL  APTT     Status: None   Collection Time: 12/18/15  2:30 PM  Result Value Ref Range   aPTT 30 24 - 37 seconds  Basic metabolic panel     Status: Abnormal   Collection Time: 12/18/15  2:35 PM  Result Value Ref Range   Sodium 138 135 - 145 mmol/L   Potassium 3.1 (L) 3.5 - 5.1 mmol/L   Chloride 103 101 - 111 mmol/L   CO2 24 22 - 32 mmol/L   Glucose, Bld 106 (H) 65 - 99 mg/dL   BUN 12 6 - 20 mg/dL   Creatinine, Ser 0.72 0.44 - 1.00 mg/dL   Calcium 9.6 8.9 - 10.3 mg/dL   GFR calc non Af Amer >60 >60 mL/min   GFR calc Af Amer >60 >60 mL/min   Anion gap 11 5 - 15  CBC     Status: None   Collection Time: 12/18/15  2:35 PM  Result Value Ref Range   WBC 7.6 4.0 - 10.5 K/uL   RBC 4.45 3.87 - 5.11 MIL/uL   Hemoglobin 12.7 12.0 - 15.0 g/dL   HCT 39.6 36.0 - 46.0 %   MCV  89.0 78.0 - 100.0 fL   MCH 28.5 26.0 - 34.0 pg   MCHC 32.1 30.0 - 36.0 g/dL   RDW 13.4 11.5 - 15.5 %   Platelets 397 150 - 400 K/uL  Pregnancy, urine     Status: None   Collection Time: 12/18/15  3:45 PM  Result Value Ref Range   Preg Test, Ur NEGATIVE NEGATIVE  Urinalysis, Routine w reflex microscopic (not at [facility])     Status: None   Collection Time: 12/18/15  3:45 PM  Result Value Ref Range   Color, Urine YELLOW YELLOW   APPearance CLEAR CLEAR   Specific Gravity, Urine 1.011 1.005 - 1.030   pH 8.0 5.0 - 8.0   Glucose, UA NEGATIVE NEGATIVE mg/dL   Hgb urine dipstick NEGATIVE NEGATIVE   Bilirubin Urine NEGATIVE NEGATIVE   Ketones, ur NEGATIVE NEGATIVE mg/dL   Protein, ur NEGATIVE NEGATIVE mg/dL   Nitrite NEGATIVE NEGATIVE   Leukocytes, UA  NEGATIVE NEGATIVE  MRSA PCR Screening     Status: None   Collection Time: 12/18/15  8:10 PM  Result Value Ref Range   MRSA by PCR NEGATIVE NEGATIVE  CBC     Status: None   Collection Time: 12/18/15  8:15 PM  Result Value Ref Range   WBC 8.5 4.0 - 10.5 K/uL   RBC 4.30 3.87 - 5.11 MIL/uL   Hemoglobin 12.2 12.0 - 15.0 g/dL   HCT 38.4 36.0 - 46.0 %   MCV 89.3 78.0 - 100.0 fL   MCH 28.4 26.0 - 34.0 pg   MCHC 31.8 30.0 - 36.0 g/dL   RDW 13.3 11.5 - 15.5 %   Platelets 379 150 - 400 K/uL  Creatinine, serum     Status: None   Collection Time: 12/18/15  8:15 PM  Result Value Ref Range   Creatinine, Ser 0.66 0.44 - 1.00 mg/dL   GFR calc non Af Amer >60 >60 mL/min   GFR calc Af Amer >60 >60 mL/min  Troponin I (q 6hr x 3)     Status: Abnormal   Collection Time: 12/18/15  8:15 PM  Result Value Ref Range   Troponin I 1.70 (HH) <0.031 ng/mL  Basic metabolic panel     Status: Abnormal   Collection Time: 12/19/15  2:21 AM  Result Value Ref Range   Sodium 137 135 - 145 mmol/L   Potassium 3.9 3.5 - 5.1 mmol/L   Chloride 105 101 - 111 mmol/L   CO2 23 22 - 32 mmol/L   Glucose, Bld 97 65 - 99 mg/dL   BUN <5 (L) 6 - 20 mg/dL   Creatinine, Ser 0.66  0.44 - 1.00 mg/dL   Calcium 8.6 (L) 8.9 - 10.3 mg/dL   GFR calc non Af Amer >60 >60 mL/min   GFR calc Af Amer >60 >60 mL/min   Anion gap 9 5 - 15  CBC     Status: Abnormal   Collection Time: 12/19/15  2:21 AM  Result Value Ref Range   WBC 7.8 4.0 - 10.5 K/uL   RBC 4.08 3.87 - 5.11 MIL/uL   Hemoglobin 11.4 (L) 12.0 - 15.0 g/dL   HCT 36.6 36.0 - 46.0 %   MCV 89.7 78.0 - 100.0 fL   MCH 27.9 26.0 - 34.0 pg   MCHC 31.1 30.0 - 36.0 g/dL   RDW 13.3 11.5 - 15.5 %   Platelets 373 150 - 400 K/uL  Troponin I (q 6hr x 3)     Status: Abnormal   Collection Time: 12/19/15  2:21 AM  Result Value Ref Range   Troponin I 4.94 (HH) <0.031 ng/mL  Lipid panel     Status: Abnormal   Collection Time: 12/19/15  2:21 AM  Result Value Ref Range   Cholesterol 120 0 - 200 mg/dL   Triglycerides 46 <150 mg/dL   HDL 38 (L) >40 mg/dL   Total CHOL/HDL Ratio 3.2 RATIO   VLDL 9 0 - 40 mg/dL   LDL Cholesterol 73 0 - 99 mg/dL    Intake/Output Summary (Last 24 hours) at 12/19/15 0829 Last data filed at 12/19/15 0800  Gross per 24 hour  Intake   1050 ml  Output   1275 ml  Net   -225 ml    Telemetry: Sinus rhythm, sinus bradycardia.  Occasional PVCs.    LHC 12/18/15: 1st Diag lesion, 100% stenosed.  There is mild to moderate left ventricular systolic dysfunction.  1. Total occlusion of the  first diagonal branch of the LAD, with typical angiographic appearance of spontaneous coronary artery dissection/intramural hematoma 2. Normal left main, left circumflex, LAD, and RCA 3. 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 Treat with opioid analgesics and ASA. Post-MI medical therapy with beta-blocker/ACE as tolerated. Avoid heparin, P2Y12 inhibitors. Wrote to start statin but would only continue if hyperlipidemia present. Would keep in hospital 48 hours minimum. Check echo and carotid duplex (eval for FMD) ASSESSMENT AND  PLAN:  Active Problems:   Acute MI, lateral wall, initial episode of care [facility])   # STEMI: Melody Gardner had a spontaneous coronary artery dissection of D1.  Carotid ultrasound pending for evaluation of FMD.  Lipids are not elevated so we will not continue a statin, as this was not due to atherosclerotic coronary disease. - Echo pending.  LV gram showed LVEF 45-50% with anterolateral akinesis. -  Continue aspirin, metoprolol  - Stop atorvastatin - Start losartan 25 mg daily  # Hypertensive heart disease: Melody Gardner was on HCTZ and amlodipine for BP at home.  We will switch this to metoprolol and losartan as above.  Time spent: 25 minutes-Greater than 50% of this time was spent in counseling, explanation of diagnosis, planning of further management, and coordination of care.    Tameem Pullara C. Oval Linsey, MD, [facility] 12/19/2015 8:29 AM

## 2015-12-19 NOTE — Care Management Note (Addendum)
Case Management Note  Patient Details  Name: Melody Gardner MRN: [id number] Date of Birth: 13-May-1966                  Action/Plan:adm w mi  Expected Discharge Date:                  Expected Discharge Plan:  Home/Self Care  In-House Referral:     Discharge planning Services     Post Acute Care Choice:    Choice offered to:     DME Arranged:    DME Agency:     HH Arranged:    [place] Agency:     Status of Service:     Medicare Important Message Given:    Date Medicare IM Given:    Medicare IM give by:    Date Additional Medicare IM Given:    Additional Medicare Important Message give by:     If discussed at [place] of Stay Meetings, dates discussed:    Additional Comments: ur review done. Lives w husband, pcp dr Minna Merritts, Wendi Maya, RN 12/19/2015, 9:22 AM

## 2015-12-20 DIAGNOSIS — I119 Hypertensive heart disease without heart failure: Secondary | ICD-10-CM

## 2015-12-20 LAB — BASIC METABOLIC PANEL
ANION GAP: 15 (ref 5–15)
BUN: 6 mg/dL (ref 6–20)
CHLORIDE: 102 mmol/L (ref 101–111)
CO2: 23 mmol/L (ref 22–32)
Calcium: 9.5 mg/dL (ref 8.9–10.3)
Creatinine, Ser: 0.84 mg/dL (ref 0.44–1.00)
GFR calc non Af Amer: >60 mL/min (ref >60)
GLUCOSE: 105 mg/dL — AB (ref 65–99)
POTASSIUM: 4 mmol/L (ref 3.5–5.1)
Sodium: 140 mmol/L (ref 135–145)

## 2015-12-20 LAB — CBC
HEMATOCRIT: 41.8 % (ref 36.0–46.0)
HEMOGLOBIN: 13.3 g/dL (ref 12.0–15.0)
MCH: 28.4 pg (ref 26.0–34.0)
MCHC: 31.8 g/dL (ref 30.0–36.0)
MCV: 89.3 fL (ref 78.0–100.0)
Platelets: 433 10*3/uL — ABNORMAL HIGH (ref 150–400)
RBC: 4.68 MIL/uL (ref 3.87–5.11)
RDW: 13.4 % (ref 11.5–15.5)
WBC: 9.8 10*3/uL (ref 4.0–10.5)

## 2015-12-20 NOTE — Progress Notes (Signed)
PATIENT ID: [id number] with Crohn's disease and hypertension here with spontaneous coronary dissection.  INTERVAL HISTORY: Echo showed normal systolic function.   SUBJECTIVE: [name] Gardner denies chest pain or shortness of breath. She has some left toe pain. She has not been up and walking yet.   PHYSICAL EXAM Filed Vitals:   12/20/15 0500 12/20/15 0600 12/20/15 0700 12/20/15 0806  BP: 123/83 124/84 121/85   Pulse:      Temp:    97.9 F (36.6 C)  TempSrc:    Oral  Resp: 21 17 19    Height:      Weight:      SpO2: 98% 97% 98%    General:  Well-appearing.  NAD Neck: No JVD Lungs:  CTAB.  No crackles, rhonchi or wheezes. Heart:  RRR.  No m/r/g.  Normal S1/S2 Abdomen: Soft, NT, ND.  +BS Extremities:  WWP.  No edema   LABS: Lab Results  Component Value Date   TROPONINI 6.43* 12/19/2015   Results for orders placed or performed during the hospital encounter of 12/18/15 (from the past 24 hour(s))  CBC     Status: Abnormal   Collection Time: 12/20/15  5:21 AM  Result Value Ref Range   WBC 9.8 4.0 - 10.5 K/uL   RBC 4.68 3.87 - 5.11 MIL/uL   Hemoglobin 13.3 12.0 - 15.0 g/dL   HCT 41.8 36.0 - 46.0 %   MCV 89.3 78.0 - 100.0 fL   MCH 28.4 26.0 - 34.0 pg   MCHC 31.8 30.0 - 36.0 g/dL   RDW 13.4 11.5 - 15.5 %   Platelets 433 (H) 150 - 400 K/uL  Basic metabolic panel     Status: Abnormal   Collection Time: 12/20/15  5:21 AM  Result Value Ref Range   Sodium 140 135 - 145 mmol/L   Potassium 4.0 3.5 - 5.1 mmol/L   Chloride 102 101 - 111 mmol/L   CO2 23 22 - 32 mmol/L   Glucose, Bld 105 (H) 65 - 99 mg/dL   BUN 6 6 - 20 mg/dL   Creatinine, Ser 0.84 0.44 - 1.00 mg/dL   Calcium 9.5 8.9 - 10.3 mg/dL   GFR calc non Af Amer >60 >60 mL/min   GFR calc Af Amer >60 >60 mL/min   Anion gap 15 5 - 15    Intake/Output Summary (Last 24 hours) at 12/20/15 0826 Last data filed at 12/20/15 0700  Gross per 24 hour  Intake    570 ml  Output   1025 ml  Net   -455 ml    Telemetry: Sinus rhythm,  sinus bradycardia.  Occasional PVCs.    LHC 12/18/15: 1st Diag lesion, 100% stenosed.  There is mild to moderate left ventricular systolic dysfunction.  1. Total occlusion of the first diagonal branch of the LAD, with typical angiographic appearance of spontaneous coronary artery dissection/intramural hematoma 2. Normal left main, left circumflex, LAD, and RCA 3. 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 Treat with opioid analgesics and ASA. Post-MI medical therapy with beta-blocker/ACE as tolerated. Avoid heparin, P2Y12 inhibitors. Wrote to start statin but would only continue if hyperlipidemia present. Would keep in hospital 48 hours minimum. Check echo and carotid duplex (eval for FMD)  Echo 12/19/15: Study Conclusions  - Left ventricle: The cavity size was normal. Wall thickness was  normal. Systolic function was normal. The estimated ejection  fraction was in the range of 60% to  65%. Mildly abnormal GLPSS at  -17%, more regional inferior strain. Wall motion was normal;  there were no regional wall motion abnormalities. Doppler  parameters are consistent with abnormal left ventricular  relaxation (grade 1 diastolic dysfunction). The E/e&' ratio is  between 8-15, suggesting indeterminate LV filling pressure. - Left atrium: The atrium was normal in size. - Tricuspid valve: There was no significant regurgitation. - Inferior vena cava: The vessel was normal in size. The  respirophasic diameter changes were in the normal range (= 50%),  consistent with normal central venous pressure.  Impressions:  - LVEF 60-65%, normal wall thickness, normal wall motion, mildly  abnormal GLPSS at -17% (inferior), Grade 1 DD with indeterminate  LV filling pressure, normal chamber sizes, normal IVC.  ASSESSMENT AND PLAN:    Active Problems:   Acute MI, lateral wall, initial episode of care  [facility])   Hypertensive heart disease   Coronary artery dissection   # STEMI: [name] Gardner had a spontaneous coronary artery dissection of D1.  Carotid ultrasound pending for evaluation of FMD. Echo reveals normal systolic function, which is improved from the left ventriculography which showed an EF of 45-50% with anterolateral akinesis.  Lipids are not elevated so her statin was discontinued.  Due to her Crohn's disease she was concerned about starting aspirin. However I discussed this with her outside GI doctor, Dr. Acquanetta Sit she was in agreement that aspirin is indicated at this time although it is possible it will  increase the likelihood of a Crohn's flare.  - Continue aspirin, metoprolol, and losartan  # Hypertensive heart disease: [name] Gardner was on HCTZ and amlodipine for BP at home.continue metoprolol and losartan and titrate as indicated.  Time spent: 25 minutes-Greater than 50% of this time was spent in counseling, explanation of diagnosis, planning of further management, and coordination of care.    Kona Yusuf C. Oval Linsey, MD, [facility] 12/20/2015 8:26 AM

## 2015-12-20 NOTE — Progress Notes (Signed)
CARDIAC REHAB PHASE I   PRE:  Rate/Rhythm: 80 SR    BP: sitting 130/90 right arm    SaO2: 97 RA  MODE:  Ambulation: 460 ft   POST:  Rate/Rhythm: 84 SR    BP: sitting 116/84     SaO2:   Pt c/o HA and lightheadedness during walk. Also c/o significant fatigue and coldness after walking. BP slightly lower. Denied CP. Return to bed. Gave her MI book and CRPII brochure. Will f/u tomorrow. Needs more mobility before d/c. [id number]  [place], ACSM 12/20/2015 3:25 PM

## 2015-12-21 MED ORDER — NEBIVOLOL HCL 5 MG PO TABS
2.5000 mg | ORAL_TABLET | Freq: Every day | ORAL | Status: DC
  Administered 2015-12-22: 2.5 mg via ORAL
  Filled 2015-12-21: qty 1

## 2015-12-21 MED ORDER — LOSARTAN POTASSIUM 25 MG PO TABS
12.5000 mg | ORAL_TABLET | Freq: Every day | ORAL | Status: DC
  Administered 2015-12-22: 12.5 mg via ORAL
  Filled 2015-12-21: qty 1

## 2015-12-21 NOTE — Progress Notes (Signed)
CARDIAC REHAB PHASE I   PRE:  Rate/Rhythm: 84 SR  BP:  Sitting: 113/95 lying, 114/74 sitting, 114/76 standing        SaO2: 100 RA  MODE:  Ambulation: 500 ft   POST:  Rate/Rhythm: 89 SR  BP:  Sitting: 108/90         SaO2: 100 RA  Pt in bed, c/o fatigue, dizziness, stood, then requested to sit due to dizziness, improved with rest. See BP as above. Completed MI education.  Reviewed risk factors, anti-platelet therapy, activity restrictions, ntg, exercise, heart healthy diet, sodium restrictions, portion control, and phase 2 cardiac rehab. Pt verbalized understanding, receptive to education. Pt agrees to phase 2 cardiac rehab referral, will send to [facility] per pt request. Following education, pt agreeable to walk. Pt ambulated 500 ft on RA, rolling walker, handheld assist, steady gait, tolerated well. Encouraged additional ambulation with staff as tolerated. Pt to recliner after walk, call bell within reach. Will follow.   [phone] Lenna Sciara, RN, BSN 12/21/2015 11:12 AM

## 2015-12-21 NOTE — Progress Notes (Signed)
PATIENT ID: [id number] with Crohn's disease and hypertension here with spontaneous coronary dissection managed medically.   LVEF 60-65% on echo.  SUBJECTIVE: Ms. Merk was able to walk yesterday with PT.  She did get tired but was able to walk.  She denies exertional chest pain or CP at rest. She notes fatigue at rest.  PHYSICAL EXAM Filed Vitals:   12/20/15 1126 12/20/15 1328 12/20/15 1950 12/21/15 0507  BP:  105/85 115/63 105/64  Pulse:  76 80 74  Temp: 97.9 F (36.6 C) 98.3 F (36.8 C) 98.2 F (36.8 C) 97.7 F (36.5 C)  TempSrc: Oral Oral Oral Oral  Resp:  18 18 18   Height:      Weight:    74.617 kg (164 lb 8 oz)  SpO2:  99% 100% 100%   General:  Well-appearing.  NAD Neck: No JVD Lungs:  CTAB.  No crackles, rhonchi or wheezes. Heart:  RRR.  No m/r/g.  Normal S1/S2 Abdomen: Soft, NT, ND.  +BS Extremities:  WWP.  No edema   LABS: Lab Results  Component Value Date   TROPONINI 6.43* 12/19/2015   No results found for this or any previous visit (from the past 24 hour(s)). No intake or output data in the 24 hours ending 12/21/15 0901  Telemetry: Sinus rhythm, sinus bradycardia.  Occasional PVCs.   LHC 12/18/15: 1st Diag lesion, 100% stenosed.  There is mild to moderate left ventricular systolic dysfunction.  1. Total occlusion of the first diagonal branch of the LAD, with typical angiographic appearance of spontaneous coronary artery dissection/intramural hematoma 2. Normal left main, left circumflex, LAD, and RCA 3. 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 Treat with opioid analgesics and ASA. Post-MI medical therapy with beta-blocker/ACE as tolerated. Avoid heparin, P2Y12 inhibitors. Wrote to start statin but would only continue if hyperlipidemia present. Would keep in hospital 48 hours minimum. Check echo and carotid duplex (eval for FMD)  Echo 12/19/15: Study Conclusions  -  Left ventricle: The cavity size was normal. Wall thickness was  normal. Systolic function was normal. The estimated ejection  fraction was in the range of 60% to 65%. Mildly abnormal GLPSS at  -17%, more regional inferior strain. Wall motion was normal;  there were no regional wall motion abnormalities. Doppler  parameters are consistent with abnormal left ventricular  relaxation (grade 1 diastolic dysfunction). The E/e&' ratio is  between 8-15, suggesting indeterminate LV filling pressure. - Left atrium: The atrium was normal in size. - Tricuspid valve: There was no significant regurgitation. - Inferior vena cava: The vessel was normal in size. The  respirophasic diameter changes were in the normal range (= 50%),  consistent with normal central venous pressure.  Impressions:  - LVEF 60-65%, normal wall thickness, normal wall motion, mildly  abnormal GLPSS at -17% (inferior), Grade 1 DD with indeterminate  LV filling pressure, normal chamber sizes, normal IVC.  ASSESSMENT AND PLAN:    Active Problems:   Acute MI, lateral wall, initial episode of care [facility])   Hypertensive heart disease   Coronary artery dissection   # STEMI: Ms. Scaduto had a spontaneous coronary artery dissection of D1.  Carotid ultrasound pending for evaluation of FMD.  Will need to schedule this as an outpatient.  Echo reveals normal systolic function, which is improved from the left ventriculography which showed an EF of 45-50% with anterolateral akinesis.  Lipids are not elevated so her statin was discontinued.  Due to her Crohn's disease she was concerned about starting aspirin. However I discussed this with her outside GI doctor, Dr. Acquanetta Sit she was in agreement that aspirin is indicated at this time although it is possible it will  increase the likelihood of a Crohn's flare.  Continue aspirin.  Reduce losartan to 12.20m and switch metoprolol to Bystolic due to hypotension and fatigue.  #  Hypertensive heart disease: Ms. BAristizabalwas on HCTZ and amlodipine for BP at home.  Lowering dose of losartan and switching to Bystolic as above.   Time spent: 25 minutes-Greater than 50% of this time was spent in counseling, explanation of diagnosis, planning of further management, and coordination of care.    Onesimo Lingard C. ROval Linsey MD, [facility]3/22/2017 9:01 AM

## 2015-12-22 ENCOUNTER — Other Ambulatory Visit: Payer: Self-pay | Admitting: Cardiology

## 2015-12-22 ENCOUNTER — Telehealth: Payer: Self-pay | Admitting: Cardiovascular Disease

## 2015-12-22 DIAGNOSIS — I773 Arterial fibromuscular dysplasia: Secondary | ICD-10-CM

## 2015-12-22 DIAGNOSIS — M79605 Pain in left leg: Secondary | ICD-10-CM

## 2015-12-22 MED ORDER — ASPIRIN EC 81 MG PO TBEC: 81.0000 mg | DELAYED_RELEASE_TABLET | Freq: Every day | ORAL | Status: AC

## 2015-12-22 MED ORDER — LOSARTAN POTASSIUM 25 MG PO TABS: 12.5000 mg | ORAL_TABLET | Freq: Every day | ORAL | Status: DC

## 2015-12-22 MED ORDER — NITROGLYCERIN 0.4 MG SL SUBL: 0.4000 mg | SUBLINGUAL_TABLET | SUBLINGUAL | Status: DC | PRN

## 2015-12-22 MED ORDER — NEBIVOLOL HCL 2.5 MG PO TABS: 2.5000 mg | ORAL_TABLET | Freq: Every day | ORAL | Status: DC

## 2015-12-22 NOTE — Telephone Encounter (Signed)
Pt still admitted on 12/22/15.

## 2015-12-22 NOTE — Progress Notes (Signed)
PATIENT ID: [id number] with Crohn's disease and hypertension here with spontaneous coronary dissection managed medically.   LVEF 60-65% on echo.  SUBJECTIVE: Melody Gardner is feeling better today.  She is less fatigued and dizzy.  She reports intermittent pain and swelling in the left leg that began prior to hospitalization.  She has not noted any edema while in the hospital.   PHYSICAL EXAM Filed Vitals:   12/21/15 1510 12/21/15 2049 12/22/15 0403 12/22/15 0500  BP: 115/67 109/65 120/76   Pulse: 80 75 76   Temp: 97.8 F (36.6 C) 98.7 F (37.1 C) 98.2 F (36.8 C)   TempSrc: Oral Oral Oral   Resp: 18 18 18    Height:      Weight:    74.844 kg (165 lb)  SpO2: 100% 100% 99%    General:  Well-appearing.  NAD Neck: No JVD Lungs:  CTAB.  No crackles, rhonchi or wheezes. Heart:  RRR.  No m/r/g.  Normal S1/S2 Abdomen: Soft, NT, ND.  +BS Extremities:  Warm.  1+ DP/PT on L.  2+ DP/PT on R.  No edema   LABS: Lab Results  Component Value Date   TROPONINI 6.43* 12/19/2015   No results found for this or any previous visit (from the past 24 hour(s)). No intake or output data in the 24 hours ending 12/22/15 0955  Telemetry: Sinus rhythm.  No events.   LHC 12/18/15: 1st Diag lesion, 100% stenosed.  There is mild to moderate left ventricular systolic dysfunction.  1. Total occlusion of the first diagonal branch of the LAD, with typical angiographic appearance of spontaneous coronary artery dissection/intramural hematoma 2. Normal left main, left circumflex, LAD, and RCA 3. 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 Treat with opioid analgesics and ASA. Post-MI medical therapy with beta-blocker/ACE as tolerated. Avoid heparin, P2Y12 inhibitors. Wrote to start statin but would only continue if hyperlipidemia present. Would keep in hospital 48 hours minimum. Check echo and carotid duplex (eval for FMD)  Echo  12/19/15: Study Conclusions  - Left ventricle: The cavity size was normal. Wall thickness was  normal. Systolic function was normal. The estimated ejection  fraction was in the range of 60% to 65%. Mildly abnormal GLPSS at  -17%, more regional inferior strain. Wall motion was normal;  there were no regional wall motion abnormalities. Doppler  parameters are consistent with abnormal left ventricular  relaxation (grade 1 diastolic dysfunction). The E/e&' ratio is  between 8-15, suggesting indeterminate LV filling pressure. - Left atrium: The atrium was normal in size. - Tricuspid valve: There was no significant regurgitation. - Inferior vena cava: The vessel was normal in size. The  respirophasic diameter changes were in the normal range (= 50%),  consistent with normal central venous pressure.  Impressions:  - LVEF 60-65%, normal wall thickness, normal wall motion, mildly  abnormal GLPSS at -17% (inferior), Grade 1 DD with indeterminate  LV filling pressure, normal chamber sizes, normal IVC.  ASSESSMENT AND PLAN:    Active Problems:   Acute MI, lateral wall, initial episode of care [facility])   Hypertensive heart disease   Coronary artery dissection   # STEMI: Melody Gardner had a spontaneous coronary artery dissection of D1.  Carotid ultrasound pending for evaluation of FMD.  Will need to schedule this as an outpatient.  Echo reveals normal systolic function, which is improved from the left ventriculography which showed an EF of 45-50% with anterolateral akinesis.  Lipids are not elevated so her statin was discontinued.  Due to her Crohn's disease she was concerned about starting aspirin. However I discussed this with her outside GI doctor, Dr. Acquanetta Sit she was in agreement that aspirin is indicated at this time although it is possible it will  increase the likelihood of a Crohn's flare.  Continue aspirin. BP is   # Hypertensive heart disease: Melody Gardner was on HCTZ and  amlodipine for BP at home.  Blood pressure is well-controlled and fatigue is improved on Bystolic compared with metoprolol.  Continue losartan.    # L Leg pain: Melody Gardner notes having pain in her L leg and intermittent swelling prior to admission.  She has no edema, calf tenderness or palpable cord now.  DVT seems unlikely.  Pulses are diminished compared with the R side.  We will order ABIs to be completed as an outpatient.   Time spent: 25 minutes-Greater than 50% of this time was spent in counseling, explanation of diagnosis, planning of further management, and coordination of care.   Cornie Herrington C. Oval Linsey, MD, [facility] 12/22/2015 9:55 AM

## 2015-12-22 NOTE — Progress Notes (Signed)
CARDIAC REHAB PHASE I   PRE:  Rate/Rhythm: 85 SR  BP:  Sitting: 117/77        SaO2: 100 RA  MODE:  Ambulation: 500 ft   POST:  Rate/Rhythm: 88 SR  BP:  Sitting: 113/57         SaO2: 100 RA  Pt ambulated 500 ft on RA, assist x1, slow, steady gait, tolerated well, did c/o some fatigue, generalized weakness. Pt states she has no questions at this time regarding education. Pt to edge of bed after walk, call bell within reach, awaiting discharge.   [id number] Lenna Sciara, RN, BSN 12/22/2015 10:01 AM

## 2015-12-22 NOTE — Telephone Encounter (Signed)
TCM phone call .Marland Kitchenappt is on 12/27/15 at 2:30pm w/ [name] at  [facility] .   Thanks

## 2015-12-22 NOTE — Discharge Summary (Signed)
Discharge Summary    Patient ID: Melody Gardner,  MRN: [id number], DOB/AGE: [birth year] 50 y.o.  Admit date: 12/18/2015 Discharge date: 12/22/2015  Primary Care Provider: Garnet Koyanagi Primary Cardiologist: Dr. Skeet Latch  Discharge Diagnoses    Active Problems:   Acute MI, lateral wall, initial episode of care [facility])   Hypertensive heart disease   Coronary artery dissection   Allergies Allergies  Allergen Reactions  . Folic Acid Rash  . Sulfa Antibiotics Rash  . Sulfasalazine Itching and Rash    Diagnostic Studies/Procedures  Echo 12/19/15 - Left ventricle: The cavity size was normal. Wall thickness was  normal. Systolic function was normal. The estimated ejection  fraction was in the range of 60% to 65%. Mildly abnormal GLPSS at  -17%, more regional inferior strain. Wall motion was normal;  there were no regional wall motion abnormalities. Doppler  parameters are consistent with abnormal left ventricular  relaxation (grade 1 diastolic dysfunction). The E/e&' ratio is  between 8-15, suggesting indeterminate LV filling pressure. - Left atrium: The atrium was normal in size. - Tricuspid valve: There was no significant regurgitation. - Inferior vena cava: The vessel was normal in size. The  respirophasic diameter changes were in the normal range (= 50%),  consistent with normal central venous pressure.  Impressions:  - LVEF 60-65%, normal wall thickness, normal wall motion, mildly  abnormal GLPSS at -17% (inferior), Grade 1 DD with indeterminate  LV filling pressure, normal chamber sizes, normal IVC.  Left Heart Cath and Coronary Angiography     1st Diag lesion, 100% stenosed.  There is mild to moderate left ventricular systolic dysfunction.  1. Total occlusion of the first diagonal branch of the LAD, with typical angiographic appearance of spontaneous coronary artery dissection/intramural hematoma 2. Normal left main, left circumflex, LAD, and  RCA 3. 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 Treat with opioid analgesics and ASA. Post-MI medical therapy with beta-blocker/ACE as tolerated. Avoid heparin, P2Y12 inhibitors. Wrote to start statin but would only continue if hyperlipidemia present. Would keep in hospital 48 hours minimum. Check echo and carotid duplex (eval for FMD)  _____________   History of Present Illness   Melody Gardner is a 50 year old female with past medical history of Crohn's disease, HTN, and anemia.  She presented to [facility] on 12/18/15 with chest pain.  Troponin was elevated and EKG suggested lateral infarct, however ST elevation was mild and not diagnostic for STEMI.  She was transferred urgently to the cath lab.     Hospital Course  Cath showed total occlusion of the first diagonal branch of LAD, with appearance of SCAD.  Left main, left circumflex, LAD, and RCA were normal.  She was treated with analgesics.  She was started on metoprolol, ASA and ARB.  LDL was 73, so no statin was started.   Due to her Crohn's disease she was concerned about starting aspirin. However, this was with her outside GI doctor, Dr. Acquanetta Sit, and  she was in agreement that aspirin is indicated at this time although it is possible it will increase the likelihood of a Crohn's flare. Continue aspirin  Her blood pressure is well controlled with losartan and Bystolic which she notices less fatigue with in comparison with metoprolol.    Her EF was 60-65%, grade 1 DD with indeterminate LV filling pressure.  She will have carotid dopplers in the outpatient setting to evaluate for fibromuscular dysplasia.  She has complained of intermittent left leg pain and swelling prior to admission. She has had no edema, calf tenderness or palpable cord since admission. ABI's can be followed outpatient as pulses on left side are diminished in comparison to the  right.   _____________  Discharge Vitals Blood pressure 120/76, pulse 76, temperature 98.2 F (36.8 C), temperature source Oral, resp. rate 18, height 5' 4"  (1.626 m), weight 165 lb (74.844 kg), last menstrual period 11/24/2015, SpO2 99 %.  Filed Weights   12/20/15 0414 12/21/15 0507 12/22/15 0500  Weight: 163 lb 2.3 oz (74 kg) 164 lb 8 oz (74.617 kg) 165 lb (74.844 kg)    Labs & Radiologic Studies     CBC  Recent Labs  12/20/15 0521  WBC 9.8  HGB 13.3  HCT 41.8  MCV 89.3  PLT 476*   Basic Metabolic Panel  Recent Labs  12/20/15 0521  NA 140  K 4.0  CL 102  CO2 23  GLUCOSE 105*  BUN 6  CREATININE 0.84  CALCIUM 9.5   Ct Angio Chest Pe W/cm &/or Wo Cm  12/18/2015  CLINICAL DATA:  Pressure like central chest pain and shortness of breath today. EXAM: CT ANGIOGRAPHY CHEST WITH CONTRAST TECHNIQUE: Multidetector CT imaging of the chest was performed using the standard protocol during bolus administration of intravenous contrast. Multiplanar CT image reconstructions and MIPs were obtained to evaluate the vascular anatomy. CONTRAST:  127m OMNIPAQUE IOHEXOL 350 MG/ML SOLN COMPARISON:  Chest radiographs dated 08/12/2013. FINDINGS: Mediastinum/Lymph Nodes: No pulmonary emboli or thoracic aortic dissection identified. No masses or pathologically enlarged lymph nodes identified. Small sliding hiatal hernia. Lungs/Pleura: Small calcified granuloma in the right upper lobe. Otherwise, no lung nodules and no infiltrates. No pleural fluid. Upper abdomen: Multiple splenic flexure diverticula. Musculoskeletal: Mild dextroconvex thoracic scoliosis. Review of the MIP images confirms the above findings. IMPRESSION: 1. No pulmonary emboli or acute abnormality. 2. Small sliding hiatal hernia. 3. Colonic diverticulosis. Electronically Signed   By: SClaudie ReveringM.D.   On: 12/18/2015 16:53    Disposition   Pt is being discharged home today in good condition.  Follow-up Plans & Appointments    Bilateral carotid dopplers ordered for outpatient.    She will follow up with BTarri Fulleron March 28th at 2:30.     Discharge Instructions    Amb Referral to Cardiac Rehabilitation    Complete by:  As directed   Diagnosis:  Myocardial Infarction           Discharge Medications   Current Discharge Medication List    START taking these medications   Details  amLODipine (NORVASC) 5 MG tablet TAKE 1 TABLET BY MOUTH DAILY Qty: 30 tablet, Refills: 0      CONTINUE these medications which have NOT CHANGED   Details  azaTHIOprine (IMURAN) 50 MG tablet Take 150 mg by mouth daily.    balsalazide (COLAZAL) 750 MG capsule Take 2,250 mg by mouth 3 (three) times daily.    hydrochlorothiazide (HYDRODIURIL) 25 MG tablet TAKE 1 TABLET BY MOUTH EVERY DAY Qty: 30 tablet, Refills: 0    Multiple Vitamins-Calcium (ONE-A-DAY WOMENS PO) Take 1 tablet by mouth daily.   Associated Diagnoses: Routine health maintenance         Aspirin prescribed at discharge?  Yes High Intensity Statin Prescribed? No Beta Blocker Prescribed? Yes For EF 45% or less, Was ACEI/ARB Prescribed? Yes ADP Receptor Inhibitor Prescribed? No  For EF <40%, Aldosterone Inhibitor Prescribed? No Was EF assessed during  THIS hospitalization?Yes Was Cardiac Rehab II ordered? Yes   Outstanding Labs/Studies    Duration of Discharge Encounter   Greater than 30 minutes including physician time.  Signed, Arbutus Leas NP 12/22/2015, 12:30 PM

## 2015-12-23 NOTE — Telephone Encounter (Signed)
Patient contacted regarding discharge from [facility] on 12/22/15.  Patient understands to follow up with provider [name] on 12/27/15 at 2:30 pm at [facility]. Patient understands discharge instructions? yes  Patient understands medications and regiment? yes Patient understands to bring all medications to this visit? yes

## 2015-12-27 ENCOUNTER — Encounter: Payer: Self-pay | Admitting: Physician Assistant

## 2015-12-27 ENCOUNTER — Ambulatory Visit (INDEPENDENT_AMBULATORY_CARE_PROVIDER_SITE_OTHER): Payer: BLUE CROSS/BLUE SHIELD | Admitting: Physician Assistant

## 2015-12-27 ENCOUNTER — Other Ambulatory Visit: Payer: Self-pay | Admitting: *Deleted

## 2015-12-27 VITALS — BP 124/76 | HR 85 | Ht 66.0 in | Wt 168.0 lb

## 2015-12-27 DIAGNOSIS — I10 Essential (primary) hypertension: Secondary | ICD-10-CM

## 2015-12-27 DIAGNOSIS — I119 Hypertensive heart disease without heart failure: Secondary | ICD-10-CM

## 2015-12-27 DIAGNOSIS — I2542 Coronary artery dissection: Secondary | ICD-10-CM

## 2015-12-27 MED ORDER — NEBIVOLOL HCL 2.5 MG PO TABS
2.5000 mg | ORAL_TABLET | Freq: Every day | ORAL | Status: DC
Start: 2015-12-27 — End: 2015-12-29

## 2015-12-27 NOTE — Patient Instructions (Signed)
Your physician recommends that you schedule a follow-up appointment in: 3 months with Dr Oval Linsey. No changes in therapy has been recommended today. Keep all scheduled testing appointments that you may have scheduled.

## 2015-12-27 NOTE — Progress Notes (Signed)
Patient ID: Melody Gardner, female   DOB: July 09, 1966, 50 y.o.   MRN: [id number]    Date:  12/27/2015   ID:  Melody Gardner, DOB 08-Dec-1965, MRN [id number]  PCP:  Ann Held, DO  Primary Cardiologist:  Oval Linsey  Chief Complaint  Patient presents with  . Hospitalization Follow-up    s/p cath  patient reports no complaints. patient states that she hasn't started her blood pressure medicine due to ins reasons. patient states she will be able to pick this up today.     History of Present Illness: Melody Gardner is a 50 y.o. female with past medical history of Crohn's disease, HTN, and anemia. She presented to [facility] on 12/18/15 with chest pain. Troponin was elevated and EKG suggested lateral infarct, however ST elevation was mild and not diagnostic for STEMI. She was transferred urgently to the cath lab. the procedure revealed total occlusion of the first diagonal branch of LAD, with appearance of spontaneous coronary dissection managed medically. Left main, left circumflex, LAD, and RCA were normal. She was treated with analgesics. She was started ASA and ARB. LDL was 73, so no statin was started.  Blood pressure the hospital was well-controlled on losartan and bystolic. 2-D echocardiogram gram revealed an ejection fraction of 60-65% with grade 1 diastolic dysfunction. She is post of carotid Dopplers in the outpatient setting.  She presents for posthospital follow-up.  Patient reports doing well. She's had no further chest pain. Also the pain in the top of her left foot and ankle has improved since being in the hospital. She has no swelling of either lower extremities.   She currently denies nausea, vomiting, fever, shortness of breath, orthopnea, dizziness, PND, cough, congestion, abdominal pain, hematochezia, melena, claudication.  Wt Readings from Last 3 Encounters:  12/27/15 168 lb (76.204 kg)  12/22/15 165 lb (74.844 kg)  11/24/15 168 lb (76.204 kg)     Past Medical History  Diagnosis  Date  . Hypertension 2012  . Anemia 2013  . Depression     pt states she is depressed but not medically diagnosed  . Crohn's disease ([place])   . Acute MI, lateral wall, initial episode of care ([place]) 12/18/2015  . Hypertensive heart disease 12/19/2015    Current Outpatient Prescriptions  Medication Sig Dispense Refill  . aspirin EC 81 MG tablet Take 1 tablet (81 mg total) by mouth daily. 30 tablet 12  . balsalazide (COLAZAL) 750 MG capsule Take 2,250 mg by mouth 3 (three) times daily.    Marland Kitchen losartan (COZAAR) 25 MG tablet Take 0.5 tablets (12.5 mg total) by mouth daily. 30 tablet 3  . nebivolol (BYSTOLIC) 2.5 MG tablet Take 1 tablet (2.5 mg total) by mouth daily. 30 tablet 3  . nitroGLYCERIN (NITROSTAT) 0.4 MG SL tablet Place 1 tablet (0.4 mg total) under the tongue every 5 (five) minutes as needed for chest pain. 25 tablet 1   No current facility-administered medications for this visit.    Allergies:    Allergies  Allergen Reactions  . Folic Acid Rash  . Sulfa Antibiotics Rash  . Sulfasalazine Itching and Rash    Social History:  The patient  reports that she has never smoked. She has never used smokeless tobacco. She reports that she does not drink alcohol or use illicit drugs.   Family history:   Family History  Problem Relation Age of Onset  . Hypertension Mother   . Diabetes Father   . Cancer Brother   . Diabetes Brother   .  Diabetes Father   . Stroke Father   . Neuropathy Father   . Neuropathy Brother     ROS:  Please see the history of present illness.  All other systems reviewed and negative.   PHYSICAL EXAM: VS:  BP 124/76 mmHg  Pulse 85  Ht 5' 6"  (1.676 m)  Wt 168 lb (76.204 kg)  BMI 27.13 kg/m2  LMP 11/24/2015 (Approximate) Well nourished, well developed, in no acute distress HEENT: Pupils are equal round react to light accommodation extraocular movements are intact.  Neck: no JVDNo cervical lymphadenopathy. Cardiac: Regular rate and rhythm without murmurs  rubs or gallops. Lungs:  clear to auscultation bilaterally, no wheezing, rhonchi or rales Abd: soft, nontender, positive bowel sounds all quadrants, no hepatosplenomegaly Ext: no lower extremity edema.  2+ radial and dorsalis pedis pulses.  I'll tenderness on the dorsal aspect of the right foot and ankle. Skin: warm and dry Neuro:  Grossly normal  EKG:  Sinus rhythm lateral T-wave inversions. Rate 85 bpm  ASSESSMENT AND PLAN:  Problem List Items Addressed This Visit    Hypertensive heart disease   HTN (hypertension)   Coronary artery dissection    Other Visit Diagnoses    Essential hypertension, benign    -  Primary    Relevant Orders    EKG 12-Lead      Melody Gardner appears to be doing well since she appeared to have a spontaneous coronary artery dissection and the first diagonal. 's is concerning for fibromuscular dysplasia.  She's been taking it easy since her discharge from the hospital.no chest pain.  She appears euvolemic and her blood pressure is well-controlled.  She is taking aspirin, Cozaar 61.4 mg and bystolic 2.5 mg daily.  She is scheduled for carotid Dopplers on April 3.  The pain that she had been experiencing in her right lower extremity on the top of her foot and ankle has improved. She has excellent pulses distally, warm foot and no edema. At this time and do not think she needs ABIs completed. This appears to be a musculoskeletal issue.  The top of her foot is mildly tender.  She is okay to return to work on April 3.

## 2015-12-28 ENCOUNTER — Other Ambulatory Visit: Payer: Self-pay | Admitting: *Deleted

## 2015-12-29 ENCOUNTER — Other Ambulatory Visit: Payer: Self-pay | Admitting: *Deleted

## 2015-12-29 MED ORDER — NEBIVOLOL HCL 2.5 MG PO TABS: 2.5000 mg | ORAL_TABLET | Freq: Every day | ORAL | Status: DC

## 2016-01-02 ENCOUNTER — Inpatient Hospital Stay (HOSPITAL_COMMUNITY): Admission: RE | Admit: 2016-01-02 | Payer: BLUE CROSS/BLUE SHIELD | Source: Ambulatory Visit

## 2016-01-02 ENCOUNTER — Other Ambulatory Visit: Payer: Self-pay | Admitting: *Deleted

## 2016-01-05 ENCOUNTER — Telehealth (HOSPITAL_COMMUNITY): Payer: Self-pay | Admitting: *Deleted

## 2016-01-06 ENCOUNTER — Other Ambulatory Visit: Payer: Self-pay

## 2016-01-06 ENCOUNTER — Telehealth: Payer: Self-pay

## 2016-01-06 MED ORDER — NEBIVOLOL HCL 2.5 MG PO TABS: 2.5000 mg | ORAL_TABLET | Freq: Every day | ORAL | Status: DC

## 2016-01-06 NOTE — Telephone Encounter (Signed)
Prior auth for Bystolic 4.9JS sent to [employer].

## 2016-01-09 ENCOUNTER — Encounter: Payer: Self-pay | Admitting: Family Medicine

## 2016-01-09 ENCOUNTER — Telehealth: Payer: Self-pay | Admitting: Physician Assistant

## 2016-01-09 ENCOUNTER — Ambulatory Visit (INDEPENDENT_AMBULATORY_CARE_PROVIDER_SITE_OTHER): Payer: BLUE CROSS/BLUE SHIELD | Admitting: Family Medicine

## 2016-01-09 VITALS — BP 130/74 | HR 91 | Temp 99.2°F | Ht 66.0 in | Wt 169.0 lb

## 2016-01-09 DIAGNOSIS — I2129 ST elevation (STEMI) myocardial infarction involving other sites: Secondary | ICD-10-CM | POA: Diagnosis not present

## 2016-01-09 DIAGNOSIS — I10 Essential (primary) hypertension: Secondary | ICD-10-CM

## 2016-01-09 NOTE — Assessment & Plan Note (Signed)
con't cardio f/u See labs / meds Pt has not had any sob or cp since

## 2016-01-09 NOTE — Telephone Encounter (Signed)
Need clarification if OK for return to work note based on dates given. Hospitalization to tomorrow's date.

## 2016-01-09 NOTE — Patient Instructions (Signed)
Heart Attack A heart attack (myocardial infarction, MI) causes damage to your heart that cannot be fixed. A heart attack can happen when a heart (coronary) artery becomes blocked or narrowed. This cuts off the blood supply and oxygen to your heart. When one or more of your coronary arteries becomes blocked, that area of your heart begins to die. This causes the pain you feel during a heart attack. Heart attack pain can also occur in one part of the body but be felt in another part of the body (referred pain). You may feel referred heart attack pain in your left arm, neck, or jaw. Pain may even be felt in the right arm. CAUSES  Many conditions can cause a heart attack. These include:   Atherosclerosis. This is when a fatty substance (plaque) gradually builds up in the arteries. This buildup can block or reduce the blood supply to one or more of the heart arteries.  A blood clot. A blood clot can develop suddenly when plaque breaks up (ruptures) within a heart artery. A blood clot can block the heart artery, which prevents blood flow to the heart.   Severe tightening (spasm) of the heart artery. This cuts off blood flow through the artery.  RISK FACTORS People at risk for heart attack usually have one or more of the following risk factors:   High blood pressure (hypertension).  High cholesterol.  Smoking.  Being female.  Being overweight or obese.  Older aged.   A family history of heart disease.  Lack of exercise.  Diabetes.  Stress.  Drinking too much alcohol.  Using illegal street drugs, such as cocaine and methamphetamines. SYMPTOMS  Heart attack symptoms can vary from person to person. Symptoms depend on factors like gender and age.   In both men and women, heart attack symptoms can include the following:   Chest pain. This may feel like crushing, squeezing, or a feeling of pressure.  Shortness of breath.  Heartburn or indigestion with or without vomiting,  shortness of breath, or sweating.  Sudden cold sweats.  Sudden light-headedness.  Upper back pain.   Women can have unique heart attack symptoms, such as:   Unexplained feelings of nervousness or anxiety.  Discomfort between the shoulder blades or upper back.  Tingling in the hands and arms.   Older people (of both genders) can have subtle heart attack symptoms, such as:   Sweating.  Shortness of breath.  General tiredness or not feeling well.  DIAGNOSIS  Diagnosing a heart attack involves several tests. They include:   An assessment of your vital signs. This includes checking your:  Heart rhythm.  Blood pressure.  Breathing rate.  Oxygen level.   An ECG (electrocardiogram) to measure the electrical activity of your heart.  Blood tests called cardiac markers. In these tests, blood is drawn at scheduled times to check for the specific proteins or enzymes released by damaged heart muscle.  A chest X-ray.  An echocardiogram to evaluate heart motion and blood flow.  Coronary angiography to look at the heart arteries.  TREATMENT  Treatment for a heart attack may include:   Medicine that breaks up or dissolves blood clots in the heart artery.  Angioplasty.  Cardiac stent placement.  Intra-aortic balloon pump therapy (IABP).  Open heart surgery (coronary artery bypass graft, CABG). HOME CARE INSTRUCTIONS  Take medicines only as directed by your health care provider. You may need to take medicine after a heart attack to:   Keep your blood from  clotting too easily.  Control your blood pressure.  Lower your cholesterol.  Control abnormal heart rhythms.   Do not take the following medicines unless your health care provider approves:  Nonsteroidal anti-inflammatory drugs (NSAIDs), such as ibuprofen, naproxen, or celecoxib.  Vitamin supplements that contain vitamin A, vitamin E, or both.  Hormone replacement therapy that contains estrogen with or  without progestin.  Make lifestyle changes as directed by your health care provider. These may include:   Quitting smoking, if you smoke.  Getting regular exercise. Ask your health care provider to suggest some activities that are safe for you.  Eating a heart-healthy diet. A registered dietitian can help you learn healthy eating options.  Maintaining a healthy weight.   Managing diabetes, if necessary.  Reducing stress.  Limiting how much alcohol you drink. SEEK IMMEDIATE MEDICAL CARE IF:   You have sudden, unexplained chest discomfort.  You have sudden, unexplained discomfort in your arms, back, neck, or jaw.  You have shortness of breath at any time.  You suddenly start to sweat or your skin gets clammy.  You feel nauseous or vomit.  You suddenly feel light-headed or dizzy.  Your heart begins to beat fast or feels like it is skipping beats. These symptoms may represent a serious problem that is an emergency. Do not wait to see if the symptoms will go away. Get medical help right away. Call your local emergency services (911 in the [place] Do not drive yourself to the hospital.   This information is not intended to replace advice given to you by your health care provider. Make sure you discuss any questions you have with your health care provider.   Document Released: 09/17/2005 Document Revised: 10/08/2014 Document Reviewed: 11/20/2013 Elsevier Interactive Patient Education [employer].

## 2016-01-09 NOTE — Progress Notes (Signed)
Patient ID: Melody Gardner, female    DOB: Aug 07, 1966  Age: 50 y.o. MRN: [id number]    Subjective:  Subjective HPI Melody Gardner presents for f/u from acute lat wall MI.  She was admitted 3/18 and d/c on  3/23.  She has already had a f/u with cardiology.     Review of Systems  Constitutional: Negative for diaphoresis, appetite change, fatigue and unexpected weight change.  Eyes: Negative for pain, redness and visual disturbance.  Respiratory: Negative for cough, chest tightness, shortness of breath and wheezing.   Cardiovascular: Negative for chest pain, palpitations and leg swelling.  Endocrine: Negative for cold intolerance, heat intolerance, polydipsia, polyphagia and polyuria.  Genitourinary: Negative for dysuria, frequency and difficulty urinating.  Neurological: Negative for dizziness, light-headedness, numbness and headaches.  Psychiatric/Behavioral: Positive for dysphoric mood and decreased concentration. Negative for suicidal ideas, sleep disturbance and self-injury. The patient is not nervous/anxious.     History Past Medical History  Diagnosis Date  . Hypertension 2012  . Anemia 2013  . Depression     pt states she is depressed but not medically diagnosed  . Crohn's disease ([place])   . Acute MI, lateral wall, initial episode of care ([place]) 12/18/2015  . Hypertensive heart disease 12/19/2015    She has past surgical history that includes Renal artery stent; Laparoscopy for ectopic pregnancy; Tubal ligation (1989); Endometrial ablation (2013); and Cardiac catheterization (N/A, 12/18/2015).   Her family history includes Cancer in her brother; Diabetes in her brother and father; Hyperlipidemia in her father; Hypertension in her mother; Neuropathy in her brother and father; Stroke in her father.She reports that she has never smoked. She has never used smokeless tobacco. She reports that she does not drink alcohol or use illicit drugs.  Current Outpatient Prescriptions on File Prior to  Visit  Medication Sig Dispense Refill  . aspirin EC 81 MG tablet Take 1 tablet (81 mg total) by mouth daily. 30 tablet 12  . balsalazide (COLAZAL) 750 MG capsule Take 2,250 mg by mouth 3 (three) times daily.    Marland Kitchen losartan (COZAAR) 25 MG tablet Take 0.5 tablets (12.5 mg total) by mouth daily. 30 tablet 3  . nebivolol (BYSTOLIC) 2.5 MG tablet Take 1 tablet (2.5 mg total) by mouth daily. 30 tablet 3  . nitroGLYCERIN (NITROSTAT) 0.4 MG SL tablet Place 1 tablet (0.4 mg total) under the tongue every 5 (five) minutes as needed for chest pain. 25 tablet 1   No current facility-administered medications on file prior to visit.     Objective:  Objective Physical Exam  Constitutional: She is oriented to person, place, and time. She appears well-developed and well-nourished.  HENT:  Head: Normocephalic and atraumatic.  Eyes: Conjunctivae and EOM are normal.  Neck: Normal range of motion. Neck supple. No JVD present. Carotid bruit is not present. No thyromegaly present.  Cardiovascular: Normal rate, regular rhythm and normal heart sounds.   No murmur heard. Pulmonary/Chest: Effort normal and breath sounds normal. No respiratory distress. She has no wheezes. She has no rales. She exhibits no tenderness.  Musculoskeletal: She exhibits no edema.  Neurological: She is alert and oriented to person, place, and time.  Psychiatric: Her speech is normal and behavior is normal. Judgment and thought content normal. Her mood appears not anxious. Cognition and memory are normal. She exhibits a depressed mood.  Nursing note and vitals reviewed.  BP 130/74 mmHg  Pulse 91  Temp(Src) 99.2 F (37.3 C) (Oral)  Ht 5' 6"  (1.676 m)  Wt  169 lb (76.658 kg)  BMI 27.29 kg/m2  SpO2 98%  LMP 11/24/2015 (Approximate) Wt Readings from Last 3 Encounters:  01/09/16 169 lb (76.658 kg)  12/27/15 168 lb (76.204 kg)  12/22/15 165 lb (74.844 kg)     Lab Results  Component Value Date   WBC 9.8 12/20/2015   HGB 13.3  12/20/2015   HCT 41.8 12/20/2015   PLT 433* 12/20/2015   GLUCOSE 105* 12/20/2015   CHOL 120 12/19/2015   TRIG 46 12/19/2015   HDL 38* 12/19/2015   LDLCALC 73 12/19/2015   ALT 13 01/07/2015   AST 19 01/07/2015   NA 140 12/20/2015   K 4.0 12/20/2015   CL 102 12/20/2015   CREATININE 0.84 12/20/2015   BUN 6 12/20/2015   CO2 23 12/20/2015   TSH 1.280 01/07/2015    Ct Angio Chest Pe W/cm &/or Wo Cm  12/18/2015  CLINICAL DATA:  Pressure like central chest pain and shortness of breath today. EXAM: CT ANGIOGRAPHY CHEST WITH CONTRAST TECHNIQUE: Multidetector CT imaging of the chest was performed using the standard protocol during bolus administration of intravenous contrast. Multiplanar CT image reconstructions and MIPs were obtained to evaluate the vascular anatomy. CONTRAST:  178m OMNIPAQUE IOHEXOL 350 MG/ML SOLN COMPARISON:  Chest radiographs dated 08/12/2013. FINDINGS: Mediastinum/Lymph Nodes: No pulmonary emboli or thoracic aortic dissection identified. No masses or pathologically enlarged lymph nodes identified. Small sliding hiatal hernia. Lungs/Pleura: Small calcified granuloma in the right upper lobe. Otherwise, no lung nodules and no infiltrates. No pleural fluid. Upper abdomen: Multiple splenic flexure diverticula. Musculoskeletal: Mild dextroconvex thoracic scoliosis. Review of the MIP images confirms the above findings. IMPRESSION: 1. No pulmonary emboli or acute abnormality. 2. Small sliding hiatal hernia. 3. Colonic diverticulosis. Electronically Signed   By: SClaudie ReveringM.D.   On: 12/18/2015 16:53     Assessment & Plan:  Plan I am having [name] her balsalazide, losartan, nitroGLYCERIN, aspirin EC, and nebivolol.  No orders of the defined types were placed in this encounter.    Problem List Items Addressed This Visit      Unprioritized   Acute MI, lateral wall, initial episode of care ([facility]    con't cardio f/u See labs / meds Pt has not had any sob or  cp since         Follow-up: Return in about 3 months (around 04/09/2016), or if symptoms worsen or fail to improve, for annual exam, fasting.  YAnn Held DO

## 2016-01-09 NOTE — Telephone Encounter (Signed)
Pt need a note for work for tomorrow.Pt is returning from work,had heart attack on 12-19-15.Please fax to [phone]. She needs this by tomorrow morning please.

## 2016-01-09 NOTE — Progress Notes (Signed)
Pre visit review using our clinic review tool, if applicable. No additional management support is needed unless otherwise documented below in the visit note. 

## 2016-01-09 NOTE — Assessment & Plan Note (Signed)
con't losartan and bystolic F/u 3 months

## 2016-01-10 ENCOUNTER — Telehealth: Payer: Self-pay

## 2016-01-10 NOTE — Telephone Encounter (Signed)
Call returned to pt, no answer when dialed.

## 2016-01-10 NOTE — Telephone Encounter (Signed)
Bystolic 2.5 mg approved. Effective through 2039.

## 2016-01-10 NOTE — Telephone Encounter (Signed)
My note says ok to return to work on April 3.  [name]

## 2016-01-11 ENCOUNTER — Other Ambulatory Visit: Payer: Self-pay | Admitting: Cardiovascular Disease

## 2016-01-11 ENCOUNTER — Ambulatory Visit (HOSPITAL_COMMUNITY)
Admission: RE | Admit: 2016-01-11 | Discharge: 2016-01-11 | Disposition: A | Discharge status: Home / Self Care | Payer: BLUE CROSS/BLUE SHIELD | Source: Ambulatory Visit | Attending: Cardiology | Admitting: Cardiology

## 2016-01-11 DIAGNOSIS — R42 Dizziness and giddiness: Secondary | ICD-10-CM

## 2016-01-11 DIAGNOSIS — I773 Arterial fibromuscular dysplasia: Secondary | ICD-10-CM | POA: Diagnosis not present

## 2016-01-11 DIAGNOSIS — I119 Hypertensive heart disease without heart failure: Secondary | ICD-10-CM | POA: Diagnosis not present

## 2016-01-11 DIAGNOSIS — I6523 Occlusion and stenosis of bilateral carotid arteries: Secondary | ICD-10-CM | POA: Insufficient documentation

## 2016-01-12 NOTE — Telephone Encounter (Signed)
No answer. Left message to call back.   

## 2016-01-13 NOTE — Telephone Encounter (Signed)
Patient informed via vm.

## 2016-01-17 ENCOUNTER — Telehealth: Payer: Self-pay | Admitting: Cardiovascular Disease

## 2016-01-17 NOTE — Telephone Encounter (Signed)
Advised patient of results.  

## 2016-01-17 NOTE — Telephone Encounter (Signed)
Returning your call. °

## 2016-01-17 NOTE — Telephone Encounter (Signed)
-----   Message from Skeet Latch, MD sent at 01/15/2016 10:36 PM EDT ----- Normal carotid ultrasound

## 2016-01-30 ENCOUNTER — Telehealth (HOSPITAL_COMMUNITY): Payer: Self-pay | Admitting: *Deleted

## 2016-01-31 ENCOUNTER — Telehealth: Payer: Self-pay | Admitting: Cardiovascular Disease

## 2016-01-31 NOTE — Telephone Encounter (Signed)
New message      Talk to [facility] regarding the messages she has been sending you

## 2016-02-01 NOTE — Telephone Encounter (Signed)
Spoke with cardiac rehab and asked them to refax Faxed received and will have Dr Oval Linsey sign

## 2016-02-03 NOTE — Telephone Encounter (Signed)
Faxed cardiac rehab, confirmation received

## 2016-02-06 ENCOUNTER — Encounter (HOSPITAL_BASED_OUTPATIENT_CLINIC_OR_DEPARTMENT_OTHER): Payer: Self-pay | Admitting: *Deleted

## 2016-02-06 ENCOUNTER — Emergency Department (HOSPITAL_BASED_OUTPATIENT_CLINIC_OR_DEPARTMENT_OTHER)
Admission: EM | Admit: 2016-02-06 | Discharge: 2016-02-07 | Disposition: A | Discharge status: Home / Self Care | Payer: BLUE CROSS/BLUE SHIELD | Attending: Emergency Medicine | Admitting: Emergency Medicine

## 2016-02-06 ENCOUNTER — Emergency Department (HOSPITAL_BASED_OUTPATIENT_CLINIC_OR_DEPARTMENT_OTHER): Payer: BLUE CROSS/BLUE SHIELD

## 2016-02-06 DIAGNOSIS — Z79899 Other long term (current) drug therapy: Secondary | ICD-10-CM | POA: Diagnosis not present

## 2016-02-06 DIAGNOSIS — I119 Hypertensive heart disease without heart failure: Secondary | ICD-10-CM | POA: Diagnosis not present

## 2016-02-06 DIAGNOSIS — R0789 Other chest pain: Secondary | ICD-10-CM | POA: Insufficient documentation

## 2016-02-06 DIAGNOSIS — R072 Precordial pain: Secondary | ICD-10-CM | POA: Diagnosis not present

## 2016-02-06 DIAGNOSIS — Z7982 Long term (current) use of aspirin: Secondary | ICD-10-CM | POA: Insufficient documentation

## 2016-02-06 DIAGNOSIS — F329 Major depressive disorder, single episode, unspecified: Secondary | ICD-10-CM | POA: Insufficient documentation

## 2016-02-06 DIAGNOSIS — R079 Chest pain, unspecified: Secondary | ICD-10-CM | POA: Diagnosis not present

## 2016-02-06 DIAGNOSIS — I252 Old myocardial infarction: Secondary | ICD-10-CM | POA: Diagnosis not present

## 2016-02-06 LAB — BASIC METABOLIC PANEL
Anion gap: 4 — ABNORMAL LOW (ref 5–15)
BUN: 11 mg/dL (ref 6–20)
CALCIUM: 8.8 mg/dL — AB (ref 8.9–10.3)
CHLORIDE: 104 mmol/L (ref 101–111)
CO2: 26 mmol/L (ref 22–32)
CREATININE: 0.8 mg/dL (ref 0.44–1.00)
GFR calc non Af Amer: >60 mL/min (ref >60)
Glucose, Bld: 85 mg/dL (ref 65–99)
Potassium: 3.4 mmol/L — ABNORMAL LOW (ref 3.5–5.1)
SODIUM: 134 mmol/L — AB (ref 135–145)

## 2016-02-06 LAB — CBC
HCT: 37.7 % (ref 36.0–46.0)
Hemoglobin: 12.6 g/dL (ref 12.0–15.0)
MCH: 29.6 pg (ref 26.0–34.0)
MCHC: 33.4 g/dL (ref 30.0–36.0)
MCV: 88.7 fL (ref 78.0–100.0)
PLATELETS: 311 10*3/uL (ref 150–400)
RBC: 4.25 MIL/uL (ref 3.87–5.11)
RDW: 13.5 % (ref 11.5–15.5)
WBC: 6.8 10*3/uL (ref 4.0–10.5)

## 2016-02-06 LAB — TROPONIN I: TROPONIN I: 0.03 ng/mL (ref <0.031)

## 2016-02-06 MED ORDER — MORPHINE SULFATE (PF) 4 MG/ML IV SOLN: 4.0000 mg | INTRAVENOUS | Status: DC | PRN

## 2016-02-06 MED ORDER — SODIUM CHLORIDE 0.9 % IV BOLUS (SEPSIS): 1000.0000 mL | Freq: Once | INTRAVENOUS | Status: DC

## 2016-02-06 MED ORDER — DICYCLOMINE HCL 10 MG PO CAPS: 10.0000 mg | ORAL_CAPSULE | Freq: Once | ORAL | Status: DC

## 2016-02-06 MED ORDER — ASPIRIN 81 MG PO CHEW
324.0000 mg | CHEWABLE_TABLET | Freq: Once | ORAL | Status: AC
  Administered 2016-02-06: 243 mg via ORAL
  Filled 2016-02-06: qty 4

## 2016-02-06 NOTE — ED Notes (Signed)
Pt returned from xray

## 2016-02-06 NOTE — ED Notes (Signed)
Chest pressure in the center of her chest all day. Hx of MI 2 months ago. She took Nitro x 2 with no relief.

## 2016-02-06 NOTE — ED Notes (Signed)
Provider at bedside

## 2016-02-06 NOTE — ED Notes (Signed)
Pt c/o central chest pain since last night that radiated to her middle back.  She states this morning she took some antacids without relief of pain, continued to have pain all day and then tonight when she got home from work, took two nitro tabs around 2050.  She did not have relief from chest pain, but states she no longer has the back pain.

## 2016-02-06 NOTE — ED Notes (Signed)
Patient transported to X-ray 

## 2016-02-06 NOTE — ED Provider Notes (Signed)
CSN: [id number]     Arrival date & time 02/06/16  2106 History   First MD Initiated Contact with Patient 02/06/16 2211     Chief Complaint  Patient presents with  . Chest Pain     (Consider location/radiation/quality/duration/timing/severity/associated sxs/prior Treatment) HPI  Blood pressure 135/88, pulse 63, temperature 98.7 F (37.1 C), temperature source Oral, resp. rate 13, weight 76.658 kg, last menstrual period 01/11/2016, SpO2 100 %.  Melody Gardner is a 50 y.o. female complaining of retrosternal chest pain radiating to the back, 8 out of 10 at worst, 4 out of 10 right now, not alleviated by nitroglycerin, states that the radiation to the posterior has resolved. Chart review shows that this patient had a full occlusion of her LAD secondary to dissection seen on catheterization in March 2017.Denies diabetes, tobacco use, family history of CAD. Patient takes daily low-dose aspirin which she had this a.m. She took 2 nitroglycerin prior to arrival with no relief of her chest pain, it only causes a mild global headache. No associated diaphoresis, shortness of breath, cough, fever, chills, increasing peripheral edema. Cannot identify any exacerbating or alleviating factors.  Past Medical History  Diagnosis Date  . Hypertension 2012  . Anemia 2013  . Depression     pt states she is depressed but not medically diagnosed  . Crohn's disease ([place])   . Acute MI, lateral wall, initial episode of care ([place]) 12/18/2015  . Hypertensive heart disease 12/19/2015   Past Surgical History  Procedure Laterality Date  . Renal artery stent    . Laparoscopy for ectopic pregnancy    . Tubal ligation  1989  . Endometrial ablation  2013  . Cardiac catheterization N/A 12/18/2015    Procedure: Left Heart Cath and Coronary Angiography;  Surgeon: Sherren Mocha, MD;  Location: [place] [facility];  Service: Cardiovascular;  Laterality: N/A;   Family History  Problem Relation Age of Onset  . Hypertension Mother    . Cancer Brother   . Diabetes Brother   . Diabetes Father   . Stroke Father   . Neuropathy Father   . Hyperlipidemia Father   . Neuropathy Brother    Social History  Substance Use Topics  . Smoking status: Never Smoker   . Smokeless tobacco: Never Used  . Alcohol Use: No   OB History    Gravida Para Term Preterm AB TAB SAB Ectopic Multiple Living   3 2 2  1   1  2       Obstetric Comments   01/20/86 pregnancy was twin pregnancy, one fetus did not develop.     Review of Systems  10 systems reviewed and found to be negative, except as noted in the HPI.   Allergies  Folic acid; Sulfa antibiotics; and Sulfasalazine  Home Medications   Prior to Admission medications   Medication Sig Start Date End Date Taking? Authorizing Provider  aspirin EC 81 MG tablet Take 1 tablet (81 mg total) by mouth daily. 12/22/15   Arbutus Leas, NP  balsalazide (COLAZAL) 750 MG capsule Take 2,250 mg by mouth 3 (three) times daily.    Historical Provider, MD  losartan (COZAAR) 25 MG tablet Take 0.5 tablets (12.5 mg total) by mouth daily. 12/22/15   Arbutus Leas, NP  nebivolol (BYSTOLIC) 2.5 MG tablet Take 1 tablet (2.5 mg total) by mouth daily. 01/06/16   Skeet Latch, MD  nitroGLYCERIN (NITROSTAT) 0.4 MG SL tablet Place 1 tablet (0.4 mg total) under the tongue every 5 (  five) minutes as needed for chest pain. 12/22/15   Arbutus Leas, NP   BP 135/88 mmHg  Pulse 63  Temp(Src) 98.7 F (37.1 C) (Oral)  Resp 13  Wt 76.658 kg  SpO2 100%  LMP 01/11/2016 Physical Exam  Constitutional: She is oriented to person, place, and time. She appears well-developed and well-nourished. No distress.  HENT:  Head: Normocephalic.  Mouth/Throat: Oropharynx is clear and moist.  Eyes: Conjunctivae are normal.  Neck: Normal range of motion. No JVD present. No tracheal deviation present.  Cardiovascular: Normal rate, regular rhythm and intact distal pulses.   Radial pulse equal bilaterally  Pulmonary/Chest: Effort  normal and breath sounds normal. No stridor. No respiratory distress. She has no wheezes. She has no rales. She exhibits no tenderness.  Abdominal: Soft. She exhibits no distension and no mass. There is no tenderness. There is no rebound and no guarding.  Musculoskeletal: Normal range of motion. She exhibits no edema or tenderness.  No calf asymmetry, superficial collaterals, palpable cords, edema, Homans sign negative bilaterally.    Neurological: She is alert and oriented to person, place, and time.  Skin: Skin is warm. She is not diaphoretic.  Psychiatric: She has a normal mood and affect.  Nursing note and vitals reviewed.   ED Course  Procedures (including critical care time) Labs Review Labs Reviewed  BASIC METABOLIC PANEL - Abnormal; Notable for the following:    Sodium 134 (*)    Potassium 3.4 (*)    Calcium 8.8 (*)    Anion gap 4 (*)    All other components within normal limits  CBC  TROPONIN I  TROPONIN I    Imaging Review Dg Chest 2 View  02/06/2016  CLINICAL DATA:  Substernal chest pain and weakness beginning last night. Previous myocardial infarct. EXAM: CHEST  2 VIEW COMPARISON:  08/12/2013 FINDINGS: The heart size and mediastinal contours are within normal limits. Both lungs are clear. The visualized skeletal structures are unremarkable. IMPRESSION: No active cardiopulmonary disease. Electronically Signed   By: Earle Gell M.D.   On: 02/06/2016 22:06   I have personally reviewed and evaluated these images and lab results as part of my medical decision-making.   EKG Interpretation None      MDM   Final diagnoses:  Atypical chest pain    Filed Vitals:   02/06/16 2111 02/06/16 2130 02/06/16 2200 02/06/16 2230  BP: 133/92 126/93 135/88 124/82  Pulse: 70 65 63 61  Temp: 98.7 F (37.1 C)     TempSrc: Oral     Resp: 18 18 13 20   Weight: 76.658 kg     SpO2: 100% 100% 100% 100%    Medications  aspirin chewable tablet 324 mg (243 mg Oral Given 02/06/16 2226)     Melody Gardner is 50 y.o. female presenting with Retrosternal chest pain radiating to the back, onset this morning. EKG is unchanged from prior with nonspecific T-wave changes, troponin negative, blood work reassuring. 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 Patient was treated medically with aspirin and ace inhibitors. Patient has been compliant with all of her medications. Case discussed with cardiologist Dr. Eula Fried who agrees with care plan of obtaining delta troponin and discharged home if negative, he requests that patient see her cardiologist tomorrow.  Delta troponin negative, patient advised to call cardiologist Dr. Burt Knack first thing in the morning for evaluation, we've had an extensive discussion of return precautions and patient verbalized her understanding.  Evaluation does not show pathology that would require ongoing emergent intervention or inpatient treatment. Pt is hemodynamically stable and mentating appropriately. Discussed findings and plan with patient/guardian, who agrees with care plan. All questions answered. Return precautions discussed and outpatient follow up given.      Monico Blitz, PA-C 02/07/16 6415  Veryl Speak, MD 02/07/16 2222

## 2016-02-07 ENCOUNTER — Telehealth: Payer: Self-pay | Admitting: Cardiovascular Disease

## 2016-02-07 ENCOUNTER — Emergency Department (HOSPITAL_BASED_OUTPATIENT_CLINIC_OR_DEPARTMENT_OTHER)
Admission: EM | Admit: 2016-02-07 | Discharge: 2016-02-07 | Disposition: A | Discharge status: Home / Self Care | Payer: BLUE CROSS/BLUE SHIELD | Source: Home / Self Care | Attending: Emergency Medicine | Admitting: Emergency Medicine

## 2016-02-07 ENCOUNTER — Encounter (HOSPITAL_BASED_OUTPATIENT_CLINIC_OR_DEPARTMENT_OTHER): Payer: Self-pay | Admitting: *Deleted

## 2016-02-07 DIAGNOSIS — F329 Major depressive disorder, single episode, unspecified: Secondary | ICD-10-CM

## 2016-02-07 DIAGNOSIS — Z7982 Long term (current) use of aspirin: Secondary | ICD-10-CM

## 2016-02-07 DIAGNOSIS — R072 Precordial pain: Secondary | ICD-10-CM | POA: Diagnosis not present

## 2016-02-07 DIAGNOSIS — I119 Hypertensive heart disease without heart failure: Secondary | ICD-10-CM

## 2016-02-07 DIAGNOSIS — R079 Chest pain, unspecified: Secondary | ICD-10-CM | POA: Insufficient documentation

## 2016-02-07 DIAGNOSIS — I252 Old myocardial infarction: Secondary | ICD-10-CM | POA: Insufficient documentation

## 2016-02-07 LAB — TROPONIN I
TROPONIN I: 0.03 ng/mL (ref <0.031)
Troponin I: 0.03 ng/mL (ref <0.031)

## 2016-02-07 MED ORDER — TRAMADOL HCL 50 MG PO TABS: 50.0000 mg | ORAL_TABLET | Freq: Four times a day (QID) | ORAL | Status: DC | PRN

## 2016-02-07 MED FILL — traMADol HCL 50 MG TABS: 50 | 4 days supply | Qty: 15 | Fill #0

## 2016-02-07 NOTE — ED Notes (Signed)
Pt verbalizes understanding of d/c instructions and denies any further needs at this time. 

## 2016-02-07 NOTE — Telephone Encounter (Signed)
Pt c/o of Chest Pain: 1. Are you having CP right now? yes 2. Are you experiencing any other symptoms (ex. SOB, nausea, vomiting, sweating)? SOB-nausea-lightheaded 3. How long have you been experiencing CP? 3 days 4. Is your CP continuous or coming and going? continuous 5. Have you taken Nitroglycerin? Not today-yesterday took two 5 minutes apart  Went to [place] yesterday and was told to see Cardiologist this am-pls advise [phone]

## 2016-02-07 NOTE — ED Provider Notes (Signed)
CSN: [id number]     Arrival date & time 02/07/16  1130 History   First MD Initiated Contact with Patient 02/07/16 1132     Chief Complaint  Patient presents with  . Chest Pain     (Consider location/radiation/quality/duration/timing/severity/associated sxs/prior Treatment) Patient is a 50 y.o. female presenting with chest pain. The history is provided by the patient.  Chest Pain Associated symptoms: no abdominal pain, no back pain, no fever, no headache, no nausea, no shortness of breath and not vomiting   Patient status post myocardial infarction the end of March. Patient with recurrent onset of substernal chest pain early hours Monday. Was seen Monday going into Tuesday morning here in the ED 2 negative troponins. Patient was discharged still with chest pain.. Discussed with the on-call cardiologist from her group at that time about the results and patient was to follow-up with cardiology this morning. When she called the office for appointment the person answering the phone on cardiologist's heard that she still had chest pain and referred her to the nearest emergency department. Patient's symptoms are completely unchanged from which started in the early hours of Monday. She was discharged home last evening was still ongoing chest pain chest pain is been constant has never resolved. No changes in the chest pain. Patient did attempt to take nitroglycerin again this morning without any relief.  Past Medical History  Diagnosis Date  . Hypertension 2012  . Anemia 2013  . Depression     pt states she is depressed but not medically diagnosed  . Crohn's disease ([place])   . Acute MI, lateral wall, initial episode of care ([place]) 12/18/2015  . Hypertensive heart disease 12/19/2015   Past Surgical History  Procedure Laterality Date  . Renal artery stent    . Laparoscopy for ectopic pregnancy    . Tubal ligation  1989  . Endometrial ablation  2013  . Cardiac catheterization N/A 12/18/2015   Procedure: Left Heart Cath and Coronary Angiography;  Surgeon: Sherren Mocha, MD;  Location: [place] [facility];  Service: Cardiovascular;  Laterality: N/A;   Family History  Problem Relation Age of Onset  . Hypertension Mother   . Cancer Brother   . Diabetes Brother   . Diabetes Father   . Stroke Father   . Neuropathy Father   . Hyperlipidemia Father   . Neuropathy Brother    Social History  Substance Use Topics  . Smoking status: Never Smoker   . Smokeless tobacco: Never Used  . Alcohol Use: No   OB History    Gravida Para Term Preterm AB TAB SAB Ectopic Multiple Living   3 2 2  1   1  2       Obstetric Comments   01/20/86 pregnancy was twin pregnancy, one fetus did not develop.     Review of Systems  Constitutional: Negative for fever.  HENT: Negative for congestion.   Eyes: Negative for visual disturbance.  Respiratory: Negative for shortness of breath.   Cardiovascular: Positive for chest pain.  Gastrointestinal: Negative for nausea, vomiting and abdominal pain.  Genitourinary: Negative for dysuria.  Musculoskeletal: Negative for back pain.  Skin: Negative for rash.  Neurological: Negative for headaches.  Hematological: Does not bruise/bleed easily.  Psychiatric/Behavioral: Negative for confusion.      Allergies  Folic acid; Sulfa antibiotics; and Sulfasalazine  Home Medications   Prior to Admission medications   Medication Sig Start Date End Date Taking? Authorizing Provider  aspirin EC 81 MG tablet Take 1  tablet (81 mg total) by mouth daily. 12/22/15   Arbutus Leas, NP  balsalazide (COLAZAL) 750 MG capsule Take 2,250 mg by mouth 3 (three) times daily.    Historical Provider, MD  losartan (COZAAR) 25 MG tablet Take 0.5 tablets (12.5 mg total) by mouth daily. 12/22/15   Arbutus Leas, NP  nebivolol (BYSTOLIC) 2.5 MG tablet Take 1 tablet (2.5 mg total) by mouth daily. 01/06/16   Skeet Latch, MD  nitroGLYCERIN (NITROSTAT) 0.4 MG SL tablet Place 1 tablet (0.4  mg total) under the tongue every 5 (five) minutes as needed for chest pain. 12/22/15   Arbutus Leas, NP  traMADol (ULTRAM) 50 MG tablet Take 1 tablet (50 mg total) by mouth every 6 (six) hours as needed. 02/07/16   Fredia Sorrow, MD   BP 134/77 mmHg  Pulse 68  Temp(Src) 99.3 F (37.4 C) (Oral)  Resp 20  Ht 5' 6"  (1.676 m)  Wt 76.658 kg  BMI 27.29 kg/m2  SpO2 100%  LMP 01/11/2016 Physical Exam  Constitutional: She is oriented to person, place, and time. She appears well-developed and well-nourished. No distress.  HENT:  Head: Normocephalic and atraumatic.  Mouth/Throat: Oropharynx is clear and moist.  Eyes: Conjunctivae and EOM are normal. Pupils are equal, round, and reactive to light.  Neck: Neck supple.  Cardiovascular: Normal rate, regular rhythm and normal heart sounds.   No murmur heard. Pulmonary/Chest: Effort normal and breath sounds normal. No respiratory distress.  Abdominal: Soft. There is no tenderness.  Musculoskeletal: Normal range of motion.  Neurological: She is alert and oriented to person, place, and time. No cranial nerve deficit. She exhibits normal muscle tone. Coordination normal.  Skin: No rash noted.  Nursing note and vitals reviewed.   ED Course  Procedures (including critical care time) Labs Review Labs Reviewed  TROPONIN I    Imaging Review Dg Chest 2 View  02/06/2016  CLINICAL DATA:  Substernal chest pain and weakness beginning last night. Previous myocardial infarct. EXAM: CHEST  2 VIEW COMPARISON:  08/12/2013 FINDINGS: The heart size and mediastinal contours are within normal limits. Both lungs are clear. The visualized skeletal structures are unremarkable. IMPRESSION: No active cardiopulmonary disease. Electronically Signed   By: Earle Gell M.D.   On: 02/06/2016 22:06   I have personally reviewed and evaluated these images and lab results as part of my medical decision-making.   EKG Interpretation   Date/Time:  Tuesday Feb 07 2016 11:39:48  EDT Ventricular Rate:  64 PR Interval:  198 QRS Duration: 95 QT Interval:  392 QTC Calculation: 404 R Axis:   68 Text Interpretation:  Sinus rhythm Abnormal T, consider ischemia, lateral  leads No significant change since last tracing Confirmed by Greogory Cornette  MD,  Erving Sassano [phone]) on 02/07/2016 11:45:27 AM Also confirmed by Rogene Houston  MD,  Georgeanna Radziewicz [phone]), editor [place], [place], [place] ([id number])  on 02/07/2016 11:51:51 AM      MDM   Final diagnoses:  Chest pain, unspecified chest pain type    Patient with constant substernal chest pain since early hours on Monday. Patient seen yesterday around midnight with 2 negative troponins. Patient was to follow-up with cardiology today. When she called this morning for appointment time the receptionist referred her back in the ED, she still had chest pain. Patient was discharged last night with chest pain they have spoken with the on-call cardiologist and they were okay with her going home. Repeat troponin here today again negative. Patient's chest pain for the past 2  days is clearly not cardiac related is not consistent with an acute MI not consistent with unstable angina. Today's EKG also without any changes compared to yesterday.  Patient stable for discharge home follow-up with her primary care doctor and cardiology.    Fredia Sorrow, MD 02/07/16 1344

## 2016-02-07 NOTE — Discharge Instructions (Signed)
Make an appointment to follow-up with your primary care doctor and cardiology. He now had 3 negative cardiac enzymes for this chest pain that started in the early hours of Monday and has been constant. This is not an acute cardiac event and the pain is probably not related to your heart at all. Take the tramadol as needed for the chest pain. Return for any new or worse symptoms.

## 2016-02-07 NOTE — ED Notes (Signed)
Chest pain continues from last night. She took a Nitro this am with no relief. She was seen here yesterday for same.

## 2016-02-07 NOTE — Discharge Instructions (Signed)
It is important that you see yourcardiologist tomorrow, do not hesitate to return to the emergency room for any new, worsening or concerning symptoms.   Nonspecific Chest Pain  Chest pain can be caused by many different conditions. There is always a chance that your pain could be related to something serious, such as a heart attack or a blood clot in your lungs. Chest pain can also be caused by conditions that are not life-threatening. If you have chest pain, it is very important to follow up with your health care provider. CAUSES  Chest pain can be caused by:  Heartburn.  Pneumonia or bronchitis.  Anxiety or stress.  Inflammation around your heart (pericarditis) or lung (pleuritis or pleurisy).  A blood clot in your lung.  A collapsed lung (pneumothorax). It can develop suddenly on its own (spontaneous pneumothorax) or from trauma to the chest.  Shingles infection (varicella-zoster virus).  Heart attack.  Damage to the bones, muscles, and cartilage that make up your chest wall. This can include:  Bruised bones due to injury.  Strained muscles or cartilage due to frequent or repeated coughing or overwork.  Fracture to one or more ribs.  Sore cartilage due to inflammation (costochondritis). RISK FACTORS  Risk factors for chest pain may include:  Activities that increase your risk for trauma or injury to your chest.  Respiratory infections or conditions that cause frequent coughing.  Medical conditions or overeating that can cause heartburn.  Heart disease or family history of heart disease.  Conditions or health behaviors that increase your risk of developing a blood clot.  Having had chicken pox (varicella zoster). SIGNS AND SYMPTOMS Chest pain can feel like:  Burning or tingling on the surface of your chest or deep in your chest.  Crushing, pressure, aching, or squeezing pain.  Dull or sharp pain that is worse when you move, cough, or take a deep breath.  Pain  that is also felt in your back, neck, shoulder, or arm, or pain that spreads to any of these areas. Your chest pain may come and go, or it may stay constant. DIAGNOSIS Lab tests or other studies may be needed to find the cause of your pain. Your health care provider may have you take a test called an ambulatory ECG (electrocardiogram). An ECG records your heartbeat patterns at the time the test is performed. You may also have other tests, such as:  Transthoracic echocardiogram (TTE). During echocardiography, sound waves are used to create a picture of all of the heart structures and to look at how blood flows through your heart.  Transesophageal echocardiogram (TEE).This is a more advanced imaging test that obtains images from inside your body. It allows your health care provider to see your heart in finer detail.  Cardiac monitoring. This allows your health care provider to monitor your heart rate and rhythm in real time.  Holter monitor. This is a portable device that records your heartbeat and can help to diagnose abnormal heartbeats. It allows your health care provider to track your heart activity for several days, if needed.  Stress tests. These can be done through exercise or by taking medicine that makes your heart beat more quickly.  Blood tests.  Imaging tests. TREATMENT  Your treatment depends on what is causing your chest pain. Treatment may include:  Medicines. These may include:  Acid blockers for heartburn.  Anti-inflammatory medicine.  Pain medicine for inflammatory conditions.  Antibiotic medicine, if an infection is present.  Medicines to dissolve blood  clots.  Medicines to treat coronary artery disease.  Supportive care for conditions that do not require medicines. This may include:  Resting.  Applying heat or cold packs to injured areas.  Limiting activities until pain decreases. HOME CARE INSTRUCTIONS  If you were prescribed an antibiotic medicine,  finish it all even if you start to feel better.  Avoid any activities that bring on chest pain.  Do not use any tobacco products, including cigarettes, chewing tobacco, or electronic cigarettes. If you need help quitting, ask your health care provider.  Do not drink alcohol.  Take medicines only as directed by your health care provider.  Keep all follow-up visits as directed by your health care provider. This is important. This includes any further testing if your chest pain does not go away.  If heartburn is the cause for your chest pain, you may be told to keep your head raised (elevated) while sleeping. This reduces the chance that acid will go from your stomach into your esophagus.  Make lifestyle changes as directed by your health care provider. These may include:  Getting regular exercise. Ask your health care provider to suggest some activities that are safe for you.  Eating a heart-healthy diet. A registered dietitian can help you to learn healthy eating options.  Maintaining a healthy weight.  Managing diabetes, if necessary.  Reducing stress. SEEK MEDICAL CARE IF:  Your chest pain does not go away after treatment.  You have a rash with blisters on your chest.  You have a fever. SEEK IMMEDIATE MEDICAL CARE IF:   Your chest pain is worse.  You have an increasing cough, or you cough up blood.  You have severe abdominal pain.  You have severe weakness.  You faint.  You have chills.  You have sudden, unexplained chest discomfort.  You have sudden, unexplained discomfort in your arms, back, neck, or jaw.  You have shortness of breath at any time.  You suddenly start to sweat, or your skin gets clammy.  You feel nauseous or you vomit.  You suddenly feel light-headed or dizzy.  Your heart begins to beat quickly, or it feels like it is skipping beats. These symptoms may represent a serious problem that is an emergency. Do not wait to see if the symptoms  will go away. Get medical help right away. Call your local emergency services (911 in the [place] Do not drive yourself to the hospital.   This information is not intended to replace advice given to you by your health care provider. Make sure you discuss any questions you have with your health care provider.   Document Released: 06/27/2005 Document Revised: 10/08/2014 Document Reviewed: 04/23/2014 Elsevier Interactive Patient Education [employer].

## 2016-02-07 NOTE — Telephone Encounter (Signed)
Pt is still having 8 out of 10 chest pain. Pt has taken 1 NITRo this morning with minimal relief. Pt stated she took the Tylenol as prescribed last night by the ED with out relief. Pt stated the pain is radiating to her back and that nothing makes it better or worse. She has c/o of lightheadedness but no N/V or SOB. Reviewed with MD, as med-center told her to go come to our office today for f/u. MD stated as pt still in active chest pain she needs to be evaluated in ED.  Pt told MD instructions and to go to the [facility] for evaluation. Pt going to have her husband drive her to the ED. Verbalized understanding, no additional questions at this time.

## 2016-02-10 DIAGNOSIS — Z5181 Encounter for therapeutic drug level monitoring: Secondary | ICD-10-CM | POA: Diagnosis not present

## 2016-02-10 DIAGNOSIS — Z79899 Other long term (current) drug therapy: Secondary | ICD-10-CM | POA: Diagnosis not present

## 2016-03-05 DIAGNOSIS — K921 Melena: Secondary | ICD-10-CM

## 2016-03-05 DIAGNOSIS — R1031 Right lower quadrant pain: Secondary | ICD-10-CM | POA: Diagnosis not present

## 2016-03-05 DIAGNOSIS — R12 Heartburn: Secondary | ICD-10-CM | POA: Insufficient documentation

## 2016-03-05 DIAGNOSIS — K5080 Crohn's disease of both small and large intestine without complications: Secondary | ICD-10-CM | POA: Diagnosis not present

## 2016-03-05 HISTORY — DX: Melena: K92.1

## 2016-03-08 ENCOUNTER — Encounter (HOSPITAL_COMMUNITY)
Admission: RE | Admit: 2016-03-08 | Discharge: 2016-03-08 | Disposition: A | Discharge status: Home / Self Care | Payer: BLUE CROSS/BLUE SHIELD | Source: Ambulatory Visit | Attending: Cardiovascular Disease | Admitting: Cardiovascular Disease

## 2016-03-08 ENCOUNTER — Encounter (HOSPITAL_COMMUNITY): Payer: Self-pay

## 2016-03-08 VITALS — BP 130/86 | HR 71 | Ht 64.25 in | Wt 172.2 lb

## 2016-03-08 DIAGNOSIS — I119 Hypertensive heart disease without heart failure: Secondary | ICD-10-CM | POA: Insufficient documentation

## 2016-03-08 DIAGNOSIS — K5090 Crohn's disease, unspecified, without complications: Secondary | ICD-10-CM | POA: Insufficient documentation

## 2016-03-08 DIAGNOSIS — Z7982 Long term (current) use of aspirin: Secondary | ICD-10-CM | POA: Insufficient documentation

## 2016-03-08 DIAGNOSIS — Z79899 Other long term (current) drug therapy: Secondary | ICD-10-CM | POA: Diagnosis not present

## 2016-03-08 DIAGNOSIS — I252 Old myocardial infarction: Secondary | ICD-10-CM | POA: Diagnosis not present

## 2016-03-08 DIAGNOSIS — I214 Non-ST elevation (NSTEMI) myocardial infarction: Secondary | ICD-10-CM

## 2016-03-08 NOTE — Progress Notes (Signed)
Cardiac Rehab Medication Review by a Pharmacist  Does the patient  feel that his/her medications are working for him/her?  yes  Has the patient been experiencing any side effects to the medications prescribed?  no  Does the patient measure his/her own blood pressure or blood glucose at home?  yes   Does the patient have any problems obtaining medications due to transportation or finances?   yes  Understanding of regimen: excellent Understanding of indications: excellent Potential of compliance: excellent    Pharmacist comments: 50 YO female presenting for CR.  She denies SE with her current regimen and states adherence.  She states her GI MD would like to restart azathioprine but she was hesitant with her cardiac medications.  I told her that it should not interact with any of her cardiac medications but she could run it by her cardiologist if she wanted more clarification.     Bennye Alm, PharmD Pharmacy Resident [phone]

## 2016-03-08 NOTE — Progress Notes (Signed)
Cardiac Individual Treatment Plan  Patient Details  Name: Melody Gardner MRN: [id number] Date of Birth: [birth year] Referring Provider:        CARDIAC REHAB PHASE II ORIENTATION from 03/08/2016 in [place]   Referring Provider  Skeet Latch MD      Initial Encounter Date:       CARDIAC REHAB PHASE II ORIENTATION from 03/08/2016 in [place]   Date  03/08/16   Referring Provider  Skeet Latch MD      Visit Diagnosis: NSTEMI (non-ST elevated myocardial infarction) [facility])  Patient's Home Medications on Admission:  Current outpatient prescriptions:  .  aspirin EC 81 MG tablet, Take 1 tablet (81 mg total) by mouth daily., Disp: 30 tablet, Rfl: 12 .  balsalazide (COLAZAL) 750 MG capsule, Take 2,250 mg by mouth 3 (three) times daily., Disp: , Rfl:  .  losartan (COZAAR) 25 MG tablet, Take 0.5 tablets (12.5 mg total) by mouth daily., Disp: 30 tablet, Rfl: 3 .  Multiple Vitamins-Minerals (WOMENS ONE DAILY PO), Take 1 tablet by mouth daily., Disp: , Rfl:  .  nebivolol (BYSTOLIC) 2.5 MG tablet, Take 1 tablet (2.5 mg total) by mouth daily., Disp: 30 tablet, Rfl: 3 .  nitroGLYCERIN (NITROSTAT) 0.4 MG SL tablet, Place 1 tablet (0.4 mg total) under the tongue every 5 (five) minutes as needed for chest pain., Disp: 25 tablet, Rfl: 1 .  traMADol (ULTRAM) 50 MG tablet, Take 1 tablet (50 mg total) by mouth every 6 (six) hours as needed., Disp: 15 tablet, Rfl: 0  Past Medical History: Past Medical History  Diagnosis Date  . Hypertension 2012  . Anemia 2013  . Depression     pt states she is depressed but not medically diagnosed  . Crohn's disease ([place])   . Acute MI, lateral wall, initial episode of care ([place]) 12/18/2015  . Hypertensive heart disease 12/19/2015    Tobacco Use: History  Smoking status  . Never Smoker   Smokeless tobacco  . Never Used    Labs: Recent Review Flowsheet Data    Labs for ITP Cardiac and  Pulmonary Rehab Latest Ref Rng 01/07/2015 12/19/2015   Cholestrol 0 - 200 mg/dL 131 120   LDLCALC 0 - 99 mg/dL 72 73   HDL >40 mg/dL 48 38(L)   Trlycerides <150 mg/dL 56 46      Capillary Blood Glucose: No results found for: GLUCAP   Exercise Target Goals: Date: 03/08/16  Exercise Program Goal: Individual exercise prescription set with THRR, safety & activity barriers. Participant demonstrates ability to understand and report RPE using BORG scale, to self-measure pulse accurately, and to acknowledge the importance of the exercise prescription.  Exercise Prescription Goal: Starting with aerobic activity 30 plus minutes a day, 3 days per week for initial exercise prescription. Provide home exercise prescription and guidelines that participant acknowledges understanding prior to discharge.  Activity Barriers & Risk Stratification:     Activity Barriers & Cardiac Risk Stratification - 03/08/16 0925    Activity Barriers & Cardiac Risk Stratification   Activity Barriers Muscular Weakness;Other (comment)   Comments minor aches and pain   Cardiac Risk Stratification High      6 Minute Walk:     6 Minute Walk      03/08/16 1009 03/08/16 1220     6 Minute Walk   Phase Initial     Distance 1482 feet     Walk Time 6 minutes     #  of Rest Breaks 0     MPH  2.8    METS  4.1    RPE  12    VO2 Peak  14.4    Symptoms  No    Resting HR 771 bpm     Resting BP 130/86 mmHg     Max Ex. HR 93 bpm     Max Ex. BP 136/86 mmHg     2 Minute Post BP 139/86 mmHg        Initial Exercise Prescription:     Initial Exercise Prescription - 03/08/16 1200    Date of Initial Exercise RX and Referring Provider   Date 03/08/16   Referring Provider Skeet Latch MD   Treadmill   MPH 2.8   Grade 2   Minutes 10   METs 3.92   Bike   Level 0.8   Minutes 10   METs 2   NuStep   Level 3   Minutes 10   METs 2   Prescription Details   Frequency (times per week) 3   Duration Progress to  30 minutes of continuous aerobic without signs/symptoms of physical distress   Intensity   THRR 40-80% of Max Heartrate 68-136   Ratings of Perceived Exertion 11-13   Progression   Progression Continue to progress workloads to maintain intensity without signs/symptoms of physical distress.   Resistance Training   Training Prescription Yes   Weight 2   Reps 10-12      Perform Capillary Blood Glucose checks as needed.  Exercise Prescription Changes:   Exercise Comments:   Discharge Exercise Prescription (Final Exercise Prescription Changes):   Nutrition:  Target Goals: Understanding of nutrition guidelines, daily intake of sodium <1557m, cholesterol <2062m calories 30% from fat and 7% or less from saturated fats, daily to have 5 or more servings of fruits and vegetables.  Biometrics:     Pre Biometrics - 03/08/16 1224    Pre Biometrics   % Body Fat 37.5 %       Nutrition Therapy Plan and Nutrition Goals:   Nutrition Discharge: Nutrition Scores:   Nutrition Goals Re-Evaluation:   Psychosocial: Target Goals: Acknowledge presence or absence of depression, maximize coping skills, provide positive support system. Participant is able to verbalize types and ability to use techniques and skills needed for reducing stress and depression.  Initial Review & Psychosocial Screening:   Quality of Life Scores:     Quality of Life - 03/08/16 1355    Quality of Life Scores   Health/Function Pre 29.75 %   Socioeconomic Pre 29.83 %   Psych/Spiritual Pre 30 %   Family Pre 30 %   GLOBAL Pre 28.8 %      PHQ-9:     Recent Review Flowsheet Data    There is no flowsheet data to display.      Psychosocial Evaluation and Intervention:   Psychosocial Re-Evaluation:   Vocational Rehabilitation: Provide vocational rehab assistance to qualifying candidates.   Vocational Rehab Evaluation & Intervention:     Vocational Rehab - 03/08/16 1439    Initial Vocational  Rehab Evaluation & Intervention   Assessment shows need for Vocational Rehabilitation No  pt can return to her job without any difficulties      Education: Education Goals: Education classes will be provided on a weekly basis, covering required topics. Participant will state understanding/return demonstration of topics presented.  Learning Barriers/Preferences:     Learning Barriers/Preferences - 03/08/16 095027  Learning Barriers/Preferences  Learning Barriers Sight   Learning Preferences Pictoral;Skilled Demonstration;Verbal Instruction;Written Material;Video      Education Topics: Count Your Pulse:  -Group instruction provided by verbal instruction, demonstration, patient participation and written materials to support subject.  Instructors address importance of being able to find your pulse and how to count your pulse when at home without a heart monitor.  Patients get hands on experience counting their pulse with staff help and individually.   Heart Attack, Angina, and Risk Factor Modification:  -Group instruction provided by verbal instruction, video, and written materials to support subject.  Instructors address signs and symptoms of angina and heart attacks.    Also discuss risk factors for heart disease and how to make changes to improve heart health risk factors.   Functional Fitness:  -Group instruction provided by verbal instruction, demonstration, patient participation, and written materials to support subject.  Instructors address safety measures for doing things around the house.  Discuss how to get up and down off the floor, how to pick things up properly, how to safely get out of a chair without assistance, and balance training.   Meditation and Mindfulness:  -Group instruction provided by verbal instruction, patient participation, and written materials to support subject.  Instructor addresses importance of mindfulness and meditation practice to help reduce stress  and improve awareness.  Instructor also leads participants through a meditation exercise.    Stretching for Flexibility and Mobility:  -Group instruction provided by verbal instruction, patient participation, and written materials to support subject.  Instructors lead participants through series of stretches that are designed to increase flexibility thus improving mobility.  These stretches are additional exercise for major muscle groups that are typically performed during regular warm up and cool down.   Hands Only CPR Anytime:  -Group instruction provided by verbal instruction, video, patient participation and written materials to support subject.  Instructors co-teach with AHA video for hands only CPR.  Participants get hands on experience with mannequins.   Nutrition I class: Heart Healthy Eating:  -Group instruction provided by PowerPoint slides, verbal discussion, and written materials to support subject matter. The instructor gives an explanation and review of the Therapeutic Lifestyle Changes diet recommendations, which includes a discussion on lipid goals, dietary fat, sodium, fiber, plant stanol/sterol esters, sugar, and the components of a well-balanced, healthy diet.   Nutrition II class: Lifestyle Skills:  -Group instruction provided by PowerPoint slides, verbal discussion, and written materials to support subject matter. The instructor gives an explanation and review of label reading, grocery shopping for heart health, heart healthy recipe modifications, and ways to make healthier choices when eating out.   Diabetes Question & Answer:  -Group instruction provided by PowerPoint slides, verbal discussion, and written materials to support subject matter. The instructor gives an explanation and review of diabetes co-morbidities, pre- and post-prandial blood glucose goals, pre-exercise blood glucose goals, signs, symptoms, and treatment of hypoglycemia and hyperglycemia, and foot care  basics.   Diabetes Blitz:  -Group instruction provided by PowerPoint slides, verbal discussion, and written materials to support subject matter. The instructor gives an explanation and review of the physiology behind type 1 and type 2 diabetes, diabetes medications and rational behind using different medications, pre- and post-prandial blood glucose recommendations and Hemoglobin A1c goals, diabetes diet, and exercise including blood glucose guidelines for exercising safely.    Portion Distortion:  -Group instruction provided by PowerPoint slides, verbal discussion, written materials, and food models to support subject matter. The instructor gives an explanation of  serving size versus portion size, changes in portions sizes over the last 20 years, and what consists of a serving from each food group.   Stress Management:  -Group instruction provided by verbal instruction, video, and written materials to support subject matter.  Instructors review role of stress in heart disease and how to cope with stress positively.     Exercising on Your Own:  -Group instruction provided by verbal instruction, power point, and written materials to support subject.  Instructors discuss benefits of exercise, components of exercise, frequency and intensity of exercise, and end points for exercise.  Also discuss use of nitroglycerin and activating EMS.  Review options of places to exercise outside of rehab.  Review guidelines for sex with heart disease.   Cardiac Drugs I:  -Group instruction provided by verbal instruction and written materials to support subject.  Instructor reviews cardiac drug classes: antiplatelets, anticoagulants, beta blockers, and statins.  Instructor discusses reasons, side effects, and lifestyle considerations for each drug class.   Cardiac Drugs II:  -Group instruction provided by verbal instruction and written materials to support subject.  Instructor reviews cardiac drug classes:  angiotensin converting enzyme inhibitors (ACE-I), angiotensin II receptor blockers (ARBs), nitrates, and calcium channel blockers.  Instructor discusses reasons, side effects, and lifestyle considerations for each drug class.   Anatomy and Physiology of the Circulatory System:  -Group instruction provided by verbal instruction, video, and written materials to support subject.  Reviews functional anatomy of heart, how it relates to various diagnoses, and what role the heart plays in the overall system.   Knowledge Questionnaire Score:     Knowledge Questionnaire Score - 03/08/16 1219    Knowledge Questionnaire Score   Pre Score 19/24      Core Components/Risk Factors/Patient Goals at Admission:     Personal Goals and Risk Factors at Admission - 03/08/16 0926    Core Components/Risk Factors/Patient Goals on Admission    Weight Management Yes;Weight Loss   Intervention Weight Management: Develop a combined nutrition and exercise program designed to reach desired caloric intake, while maintaining appropriate intake of nutrient and fiber, sodium and fats, and appropriate energy expenditure required for the weight goal.;Weight Management: Provide education and appropriate resources to help participant work on and attain dietary goals.;Weight Management/Obesity: Establish reasonable short term and long term weight goals.   Expected Outcomes Short Term: Continue to assess and modify interventions until short term weight is achieved;Weight Maintenance: Understanding of the daily nutrition guidelines, which includes 25-35% calories from fat, 7% or less cal from saturated fats, less than 261m cholesterol, less than 1.5gm of sodium, & 5 or more servings of fruits and vegetables daily;Weight Loss: Understanding of general recommendations for a balanced deficit meal plan, which promotes 1-2 lb weight loss per week and includes a negative energy balance of [phone] kcal/d;Understanding recommendations for  meals to include 15-35% energy as protein, 25-35% energy from fat, 35-60% energy from carbohydrates, less than 2047mof dietary cholesterol, 20-35 gm of total fiber daily;Understanding of distribution of calorie intake throughout the day with the consumption of 4-5 meals/snacks   Increase Strength and Stamina Yes   Intervention Provide advice, education, support and counseling about physical activity/exercise needs.;Develop an individualized exercise prescription for aerobic and resistive training based on initial evaluation findings, risk stratification, comorbidities and participant's personal goals.   Expected Outcomes Achievement of increased cardiorespiratory fitness and enhanced flexibility, muscular endurance and strength shown through measurements of functional capacity and personal statement of participant.   Improve shortness  of breath with ADL's Yes   Personal Goal Other Yes   Personal Goal short: be heart healthy ( no cardiac events or hospitalizations) eat healthier   long: be able to walk with less SOB and increase energy. Lose weight goal 145-150lbs   Intervention provide nutrition education and exercise programming to assist with weightloss, increased energy and building exercise capacity. Also increased cardiac awareness and risks factors to decrease future cardiac events and hospitalizations   Expected Outcomes be able to walk with little to no SOB, lose weight and no future cardiac events      Core Components/Risk Factors/Patient Goals Review:    Core Components/Risk Factors/Patient Goals at Discharge (Final Review):    ITP Comments:     ITP Comments      03/08/16 0921           ITP Comments Dr. Fransico Him, medical director          Comments: Pt in today for cardiac rehab from 0800 to 1000.  As a part of the orientation appointment, pt completed a 6 minute walk test.  Pt monitor showed Sr with no noted ectopy.  Pt tolerated walk test with no complaints.  Cherre Huger, BSN

## 2016-03-12 ENCOUNTER — Encounter (HOSPITAL_COMMUNITY): Payer: Self-pay

## 2016-03-12 ENCOUNTER — Encounter (HOSPITAL_COMMUNITY)
Admission: RE | Admit: 2016-03-12 | Discharge: 2016-03-12 | Disposition: A | Discharge status: Home / Self Care | Payer: BLUE CROSS/BLUE SHIELD | Source: Ambulatory Visit | Attending: Cardiovascular Disease | Admitting: Cardiovascular Disease

## 2016-03-12 DIAGNOSIS — K5090 Crohn's disease, unspecified, without complications: Secondary | ICD-10-CM | POA: Diagnosis not present

## 2016-03-12 DIAGNOSIS — I252 Old myocardial infarction: Secondary | ICD-10-CM | POA: Diagnosis not present

## 2016-03-12 DIAGNOSIS — I214 Non-ST elevation (NSTEMI) myocardial infarction: Secondary | ICD-10-CM

## 2016-03-12 DIAGNOSIS — Z79899 Other long term (current) drug therapy: Secondary | ICD-10-CM | POA: Diagnosis not present

## 2016-03-12 DIAGNOSIS — I119 Hypertensive heart disease without heart failure: Secondary | ICD-10-CM | POA: Diagnosis not present

## 2016-03-12 NOTE — Progress Notes (Signed)
Daily Session Note  Patient Details  Name: Melody Gardner MRN: [id number] Date of Birth: 11/30/1965 Referring Provider:        CARDIAC REHAB PHASE II ORIENTATION from 03/08/2016 in [place]   Referring Provider  Skeet Latch MD      Encounter Date: 03/12/2016  Check In:     Session Check In - 03/12/16 1403    Check-In   Location [facility]   Staff Present Dorna Bloom, MS, ACSM RCEP, Exercise Physiologist;Haidyn Kilburg, RN, BSN;Carlette Carlton, RN, BSN   Supervising physician immediately available to respond to emergencies [facility] immediately available   Physician(s) Dr. Marily Memos   Medication changes reported     No   Fall or balance concerns reported    No   Warm-up and Cool-down Performed as group-led instruction   Resistance Training Performed Yes   VAD Patient? No   Pain Assessment   Currently in Pain? No/denies      Capillary Blood Glucose: No results found for this or any previous visit (from the past 24 hour(s)).   Goals Met:  Exercise tolerated well  Goals Unmet:  Not Applicable  Comments: Pt started cardiac rehab today.  Pt tolerated light exercise without difficulty. VSS, telemetry-sinus rhythm, asymptomatic.  Medication list reconciled. Pt denies barriers to medicaiton compliance.  PSYCHOSOCIAL ASSESSMENT:  PHQ-0. Pt exhibits stress and anxiety coping with newly diagnosed coronary disease.  appt has been made with Jeanella Craze, hospital chaplain for today to discuss these concerns.  Pt has hopeful outlook with supportive family. No psychosocial needs identified at this time, no psychosocial interventions necessary.    Pt enjoys spending time with her adult children, singing and dancing.  Pt goals are to increase strength and stamina. Pt is also interested in weight loss.  Pt oriented to exercise equipment and routine.    Understanding verbalized.    Dr. Fransico Him is Medical Director for Cardiac Rehab at  [facility].

## 2016-03-14 ENCOUNTER — Encounter (HOSPITAL_COMMUNITY)
Admission: RE | Admit: 2016-03-14 | Discharge: 2016-03-14 | Disposition: A | Discharge status: Home / Self Care | Payer: BLUE CROSS/BLUE SHIELD | Source: Ambulatory Visit | Attending: Cardiovascular Disease | Admitting: Cardiovascular Disease

## 2016-03-14 DIAGNOSIS — I119 Hypertensive heart disease without heart failure: Secondary | ICD-10-CM | POA: Diagnosis not present

## 2016-03-14 DIAGNOSIS — I214 Non-ST elevation (NSTEMI) myocardial infarction: Secondary | ICD-10-CM

## 2016-03-14 DIAGNOSIS — K5090 Crohn's disease, unspecified, without complications: Secondary | ICD-10-CM | POA: Diagnosis not present

## 2016-03-14 DIAGNOSIS — I252 Old myocardial infarction: Secondary | ICD-10-CM | POA: Diagnosis not present

## 2016-03-14 DIAGNOSIS — Z79899 Other long term (current) drug therapy: Secondary | ICD-10-CM | POA: Diagnosis not present

## 2016-03-14 NOTE — Progress Notes (Signed)
QUALITY OF LIFE SCORE REVIEW  Pt completed Quality of Life survey as a participant in Cardiac Rehab. Patient quality of life slightly altered by physical constraints which limits ability to perform as prior to recent cardiac illness.  Pt denies chest pain or dyspnea.  Pt reports her biggest concern is the amount of health related stress and worry she carries. Pt has met with Jeanella Craze and is looking forward to the next scheduled session.   Offered emotional support and reassurance.  Will continue to monitor and intervene as necessary.

## 2016-03-16 ENCOUNTER — Encounter (HOSPITAL_COMMUNITY)
Admission: RE | Admit: 2016-03-16 | Discharge: 2016-03-16 | Disposition: A | Discharge status: Home / Self Care | Payer: BLUE CROSS/BLUE SHIELD | Source: Ambulatory Visit | Attending: Cardiovascular Disease | Admitting: Cardiovascular Disease

## 2016-03-16 DIAGNOSIS — I214 Non-ST elevation (NSTEMI) myocardial infarction: Secondary | ICD-10-CM

## 2016-03-16 DIAGNOSIS — K5090 Crohn's disease, unspecified, without complications: Secondary | ICD-10-CM | POA: Diagnosis not present

## 2016-03-16 DIAGNOSIS — I119 Hypertensive heart disease without heart failure: Secondary | ICD-10-CM | POA: Diagnosis not present

## 2016-03-16 DIAGNOSIS — Z79899 Other long term (current) drug therapy: Secondary | ICD-10-CM | POA: Diagnosis not present

## 2016-03-16 DIAGNOSIS — I252 Old myocardial infarction: Secondary | ICD-10-CM | POA: Diagnosis not present

## 2016-03-16 NOTE — Progress Notes (Signed)
Pt arrived at cardiac rehab reporting episode of indigestion and abdominal cramping this morning. Pt reports symptoms were related to her crohns disease. Pt reports symptoms were relieved with rest and tylenol. Pt tolerated light activity without difficulty.  Pt denies symptoms with exercise. Pt instructed to notify physician if symptoms persist or worsen.  Understanding verbalized.

## 2016-03-19 ENCOUNTER — Encounter (HOSPITAL_COMMUNITY)
Admission: RE | Admit: 2016-03-19 | Discharge: 2016-03-19 | Disposition: A | Discharge status: Home / Self Care | Payer: BLUE CROSS/BLUE SHIELD | Source: Ambulatory Visit | Attending: Cardiovascular Disease | Admitting: Cardiovascular Disease

## 2016-03-19 DIAGNOSIS — Z79899 Other long term (current) drug therapy: Secondary | ICD-10-CM | POA: Diagnosis not present

## 2016-03-19 DIAGNOSIS — I214 Non-ST elevation (NSTEMI) myocardial infarction: Secondary | ICD-10-CM

## 2016-03-19 DIAGNOSIS — I252 Old myocardial infarction: Secondary | ICD-10-CM | POA: Diagnosis not present

## 2016-03-19 DIAGNOSIS — K5090 Crohn's disease, unspecified, without complications: Secondary | ICD-10-CM | POA: Diagnosis not present

## 2016-03-19 DIAGNOSIS — I119 Hypertensive heart disease without heart failure: Secondary | ICD-10-CM | POA: Diagnosis not present

## 2016-03-19 NOTE — Progress Notes (Signed)
Cardiology Office Note   Date:  03/20/2016   ID:  Melody Gardner, DOB 11-May-1966, MRN [id number]  PCP:  Ann Held, DO  Cardiologist:   Skeet Latch, MD   Chief Complaint  Patient presents with  . 3 MONTHS    Faint BP.  Marland Kitchen Dizziness    Every now and then.  . Chest Pain    Last week. Mostly when she worries.  . Edema    Left armpit.     History of Present Illness: Melody Gardner is a 50 y.o. 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  Melody Gardner was admitted 12/18/15 with chest pain.  Cardiac enzymes were elevated and she had mild lateral ST elevation.  In the cath she was found to have a spontaneous dissection with 100% occlusion of D1 that was medically managed. Her echo showed LVEF 60-65% with grade 1 diastolic dysfunction.  Prior to discharge she reported pain in her left leg.  This is somewhat better since discharge but she notes intermittent pain in the right arm.  Melody Gardner reports having her renal artery stented when she was around 56.   Given this history, there was concernfor fibromuscular dysplasia.  She was referred for carotid ultrasound that revealed bilateral intimal thickening with 1-39% stenosis bilaterally but no plaque.  Overall Melody Gardner has been doing well since discharge.  She had a recurrent episode of chest pain on 02/06/16.  She was seen in he ED where cardiac enzymes were negative and EKG was not consistent with ischemia.  She notes that she sometimes has burning chest pain that occurs after eating.  Her GI doctor told her to try Pepcid or Zantac but she wanted to make sure it wouldn't interfere with her cardiac medications.  She started cardiac rehab and reports one episode of exertional chest pain but typically does well with exercise.  She denies shortness of breath, lower extremity edema, orthopnea or PND.   Past Medical History    Diagnosis Date  . Hypertension 2012  . Anemia 2013  . Depression     pt states she is depressed but not medically diagnosed  . Crohn's disease ([place])   . Acute MI, lateral wall, initial episode of care ([place]) 12/18/2015  . Hypertensive heart disease 12/19/2015    Past Surgical History  Procedure Laterality Date  . Renal artery stent    . Laparoscopy for ectopic pregnancy    . Tubal ligation  1989  . Endometrial ablation  2013  . Cardiac catheterization N/A 12/18/2015    Procedure: Left Heart Cath and Coronary Angiography;  Surgeon: Sherren Mocha, MD;  Location: [facility];  Service: Cardiovascular;  Laterality: N/A;     Current Outpatient Prescriptions  Medication Sig Dispense Refill  . aspirin EC 81 MG tablet Take 1 tablet (81 mg total) by mouth daily. 30 tablet 12  . azaTHIOprine (IMURAN) 50 MG tablet Take 50 mg by mouth 3 (three) times daily.    . balsalazide (COLAZAL) 750 MG capsule Take 2,250 mg by mouth 3 (three) times daily.    Marland Kitchen losartan (COZAAR) 25 MG tablet Take 1 tablet (25 mg total) by mouth daily. 30 tablet 3  . Multiple Vitamins-Minerals (WOMENS ONE DAILY PO) Take 1 tablet by mouth daily.    . nebivolol (BYSTOLIC) 2.5 MG tablet Take 1 tablet (2.5 mg total) by mouth daily. 30 tablet 3  .  nitroGLYCERIN (NITROSTAT) 0.4 MG SL tablet Place 1 tablet (0.4 mg total) under the tongue every 5 (five) minutes as needed for chest pain. 25 tablet 1  . traMADol (ULTRAM) 50 MG tablet Take 1 tablet (50 mg total) by mouth every 6 (six) hours as needed. 15 tablet 0   No current facility-administered medications for this visit.    Allergies:   Folic acid; Sulfa antibiotics; and Sulfasalazine    Social History:  The patient  reports that she has never smoked. She has never used smokeless tobacco. She reports that she does not drink alcohol or use illicit drugs.   Family History:  The patient's family history includes Cancer in her brother; Diabetes in her brother and father;  Hyperlipidemia in her father; Hypertension in her mother; Neuropathy in her brother and father; Stroke in her father.    ROS:  Please see the history of present illness.   Otherwise, review of systems are positive for none.   All other systems are reviewed and negative.    PHYSICAL EXAM: VS:  BP 140/110 mmHg  Pulse 76  Ht 5' 4"  (1.626 m)  Wt 172 lb (78.019 kg)  BMI 29.51 kg/m2 , BMI Body mass index is 29.51 kg/(m^2). GENERAL:  Well appearing HEENT:  Pupils equal round and reactive, fundi not visualized, oral mucosa unremarkable NECK:  No jugular venous distention, waveform within normal limits, carotid upstroke brisk and symmetric, no bruits, no thyromegaly LYMPHATICS:  No cervical adenopathy LUNGS:  Clear to auscultation bilaterally HEART:  RRR.  PMI not displaced or sustained,S1 and S2 within normal limits, no S3, no S4, no clicks, no rubs, no murmurs ABD:  Flat, positive bowel sounds normal in frequency in pitch, no bruits, no rebound, no guarding, no midline pulsatile mass, no hepatomegaly, no splenomegaly EXT:  2+ radial pulses bilaterally.  1+ L DP/TP. 2+ R DP/TP.  No edema, no cyanosis no clubbing SKIN:  No rashes no nodules NEURO:  Cranial nerves II through XII grossly intact, motor grossly intact throughout PSYCH:  Cognitively intact, oriented to person place and time   EKG:  EKG is not ordered today.  LHC 12/18/15: 100% D1 due to spontaneous dissection.  No other coronary artery disease.  Echo 12/19/15: Study Conclusions  - Left ventricle: The cavity size was normal. Wall thickness was  normal. Systolic function was normal. The estimated ejection  fraction was in the range of 60% to 65%. Mildly abnormal GLPSS at  -17%, more regional inferior strain. Wall motion was normal;  there were no regional wall motion abnormalities. Doppler  parameters are consistent with abnormal left ventricular  relaxation (grade 1 diastolic dysfunction). The E/e&' ratio is  between  8-15, suggesting indeterminate LV filling pressure. - Left atrium: The atrium was normal in size. - Tricuspid valve: There was no significant regurgitation. - Inferior vena cava: The vessel was normal in size. The  respirophasic diameter changes were in the normal range (= 50%),  consistent with normal central venous pressure.  Impressions:  - LVEF 60-65%, normal wall thickness, normal wall motion, mildly  abnormal GLPSS at -17% (inferior), Grade 1 DD with indeterminate  LV filling pressure, normal chamber sizes, normal IVC.  Carotid Doppler 01/12/16: Intimal thickening bilaterally.  1-39% ICA stenosis bilaterally.    Recent Labs: 02/06/2016: Hemoglobin 12.6; Platelets 311 03/20/2016: BUN 9; Creat 0.83; Potassium 4.7; Sodium 140    Lipid Panel    Component Value Date/Time   CHOL 120 12/19/2015 0221   TRIG 46 12/19/2015 0221  HDL 38* 12/19/2015 0221   CHOLHDL 3.2 12/19/2015 0221   VLDL 9 12/19/2015 0221   LDLCALC 73 12/19/2015 0221      Wt Readings from Last 3 Encounters:  03/20/16 172 lb (78.019 kg)  03/08/16 172 lb 2.9 oz (78.1 kg)  02/07/16 169 lb (76.658 kg)      ASSESSMENT AND PLAN:  # Spontaneous coronary artery dissection: Melody Gardner had SCAD of D1.  She is doing well clinically but reports intermittent pain in her L leg and R arm.  She has diminished pulses on the L compared with R lower extremities.  Given that she also has intimal thickening on carotid Doppler and a history of renal artery stenosis, there is concern that she may have fibromuscular dysplasia.  We will obtain CT angiography of her whole body.  Continue aspirin and nebivolol.  We will obtain a BMP prior to the CT.  Lipids are not elevated so she is not on a statin.  # Hypertension:  Blood pressure is poorly-controlled.  We will increase the losartan to 25 mg daily.  Repeat BMP at follow up appointment.  # Chest pain: Melody Gardner chest discomfort after eating is most likely GERD.  Recommended that  she try a PPI or H2 blocker.  However, we will also obtain a cardiac CT-A to evaluate for new coronary obstruction.  Current medicines are reviewed at length with the patient today.  The patient does not have concerns regarding medicines.  The following changes have been made:  Increase losartan to 25 mg daily.    Labs/ tests ordered today include:   Orders Placed This Encounter  Procedures  . CT ANGIO HEAD W OR WO CONTRAST  . CT ANGIO NECK W OR WO CONTRAST  . CT ANGIO CHEST AORTA W/CM &/OR WO/CM  . CT ANGIO AO+BIFEM W &/OR WO CONTRAST  . CT Heart Morp W/Cta Cor W/Score W/Ca W/Cm &/Or Wo/Cm  . Basic metabolic panel     Disposition:   FU with Aleshka Corney C. Oval Linsey, MD, [facility] in 1 month.     This note was written with the assistance of speech recognition software.  Please excuse any transcriptional errors.  Signed, Skanda [employer] C. Oval Linsey, MD, [facility]  03/20/2016 10:54 PM    [place]

## 2016-03-20 ENCOUNTER — Telehealth: Payer: Self-pay | Admitting: *Deleted

## 2016-03-20 ENCOUNTER — Ambulatory Visit (INDEPENDENT_AMBULATORY_CARE_PROVIDER_SITE_OTHER): Payer: BLUE CROSS/BLUE SHIELD | Admitting: Cardiovascular Disease

## 2016-03-20 ENCOUNTER — Encounter: Payer: Self-pay | Admitting: Cardiovascular Disease

## 2016-03-20 VITALS — BP 140/110 | HR 76 | Ht 64.0 in | Wt 172.0 lb

## 2016-03-20 DIAGNOSIS — I773 Arterial fibromuscular dysplasia: Secondary | ICD-10-CM

## 2016-03-20 DIAGNOSIS — I25110 Atherosclerotic heart disease of native coronary artery with unstable angina pectoris: Secondary | ICD-10-CM

## 2016-03-20 DIAGNOSIS — I10 Essential (primary) hypertension: Secondary | ICD-10-CM

## 2016-03-20 DIAGNOSIS — R079 Chest pain, unspecified: Secondary | ICD-10-CM

## 2016-03-20 LAB — BASIC METABOLIC PANEL
BUN: 9 mg/dL (ref 7–25)
CHLORIDE: 104 mmol/L (ref 98–110)
CO2: 26 mmol/L (ref 20–31)
CREATININE: 0.83 mg/dL (ref 0.50–1.05)
Calcium: 9.4 mg/dL (ref 8.6–10.4)
GLUCOSE: 82 mg/dL (ref 65–99)
POTASSIUM: 4.7 mmol/L (ref 3.5–5.3)
Sodium: 140 mmol/L (ref 135–146)

## 2016-03-20 MED ORDER — LOSARTAN POTASSIUM 25 MG PO TABS: 25.0000 mg | ORAL_TABLET | Freq: Every day | ORAL | Status: DC

## 2016-03-20 NOTE — Telephone Encounter (Signed)
Spoke with patient regarding appointments for multiple CT scans---chest, abd, head and neck  04/02/16 @ 1:00 pm at [facility]---[facility] ( bilateral upper and lower extremities)Thursday 04/05/16 @ 1:00 pm---arrive at 12:45 for registration--NPO 4 hour prior on both days.  I also gave the patient radiology's phone # [phone] in case she needs to reschedule.  Patient voiced her understanding.

## 2016-03-20 NOTE — Telephone Encounter (Signed)
-----   Message from Skeet Latch, MD sent at 03/20/2016 11:14 AM EDT ----- Please add cardiac CT-A for Melody Gardner.  To evaluate her chest pain and known CAD.

## 2016-03-20 NOTE — Telephone Encounter (Signed)
Left message to call back  

## 2016-03-20 NOTE — Patient Instructions (Addendum)
Your physician recommends that you return for lab work TODAY.  Dr Oval Linsey has ordered CT scans on multiple body locations. These studies will be done at [facility].  Your physician recommends that you schedule a follow-up appointment in: 1 month.  Your physician has recommended you make the following change in your medication:   1.) the losartan has been increased from 1/2 tablet to 1 tablet daily.

## 2016-03-21 ENCOUNTER — Encounter (HOSPITAL_COMMUNITY)
Admission: RE | Admit: 2016-03-21 | Discharge: 2016-03-21 | Disposition: A | Discharge status: Home / Self Care | Payer: BLUE CROSS/BLUE SHIELD | Source: Ambulatory Visit | Attending: Cardiovascular Disease | Admitting: Cardiovascular Disease

## 2016-03-21 DIAGNOSIS — I119 Hypertensive heart disease without heart failure: Secondary | ICD-10-CM | POA: Diagnosis not present

## 2016-03-21 DIAGNOSIS — I214 Non-ST elevation (NSTEMI) myocardial infarction: Secondary | ICD-10-CM

## 2016-03-21 DIAGNOSIS — K5090 Crohn's disease, unspecified, without complications: Secondary | ICD-10-CM | POA: Diagnosis not present

## 2016-03-21 DIAGNOSIS — Z79899 Other long term (current) drug therapy: Secondary | ICD-10-CM | POA: Diagnosis not present

## 2016-03-21 DIAGNOSIS — I252 Old myocardial infarction: Secondary | ICD-10-CM | POA: Diagnosis not present

## 2016-03-23 ENCOUNTER — Encounter (HOSPITAL_COMMUNITY)
Admission: RE | Admit: 2016-03-23 | Discharge: 2016-03-23 | Disposition: A | Discharge status: Home / Self Care | Payer: BLUE CROSS/BLUE SHIELD | Source: Ambulatory Visit | Attending: Cardiovascular Disease | Admitting: Cardiovascular Disease

## 2016-03-23 DIAGNOSIS — K5090 Crohn's disease, unspecified, without complications: Secondary | ICD-10-CM | POA: Diagnosis not present

## 2016-03-23 DIAGNOSIS — I214 Non-ST elevation (NSTEMI) myocardial infarction: Secondary | ICD-10-CM

## 2016-03-23 DIAGNOSIS — I119 Hypertensive heart disease without heart failure: Secondary | ICD-10-CM | POA: Diagnosis not present

## 2016-03-23 DIAGNOSIS — Z79899 Other long term (current) drug therapy: Secondary | ICD-10-CM | POA: Diagnosis not present

## 2016-03-23 DIAGNOSIS — I252 Old myocardial infarction: Secondary | ICD-10-CM | POA: Diagnosis not present

## 2016-03-23 NOTE — Progress Notes (Signed)
Cardiac Individual Treatment Plan  Patient Details  Name: Melody Gardner MRN: [id number] Date of Birth: [birth year] Referring Provider:        CARDIAC REHAB PHASE II EXERCISE from 03/14/2016 in [facility]   Referring Provider  Skeet Latch MD      Initial Encounter Date:       CARDIAC REHAB PHASE II EXERCISE from 03/14/2016 in [place]   Date  03/08/16   Referring Provider  Skeet Latch MD      Visit Diagnosis: No diagnosis found.  Patient's Home Medications on Admission:  Current outpatient prescriptions:  .  aspirin EC 81 MG tablet, Take 1 tablet (81 mg total) by mouth daily., Disp: 30 tablet, Rfl: 12 .  azaTHIOprine (IMURAN) 50 MG tablet, Take 50 mg by mouth 3 (three) times daily., Disp: , Rfl:  .  balsalazide (COLAZAL) 750 MG capsule, Take 2,250 mg by mouth 3 (three) times daily., Disp: , Rfl:  .  losartan (COZAAR) 25 MG tablet, Take 1 tablet (25 mg total) by mouth daily., Disp: 30 tablet, Rfl: 3 .  Multiple Vitamins-Minerals (WOMENS ONE DAILY PO), Take 1 tablet by mouth daily., Disp: , Rfl:  .  nebivolol (BYSTOLIC) 2.5 MG tablet, Take 1 tablet (2.5 mg total) by mouth daily., Disp: 30 tablet, Rfl: 3 .  nitroGLYCERIN (NITROSTAT) 0.4 MG SL tablet, Place 1 tablet (0.4 mg total) under the tongue every 5 (five) minutes as needed for chest pain., Disp: 25 tablet, Rfl: 1 .  traMADol (ULTRAM) 50 MG tablet, Take 1 tablet (50 mg total) by mouth every 6 (six) hours as needed., Disp: 15 tablet, Rfl: 0  Past Medical History: Past Medical History  Diagnosis Date  . Hypertension 2012  . Anemia 2013  . Depression     pt states she is depressed but not medically diagnosed  . Crohn's disease ([place])   . Acute MI, lateral wall, initial episode of care ([place]) 12/18/2015  . Hypertensive heart disease 12/19/2015    Tobacco Use: History  Smoking status  . Never Smoker   Smokeless tobacco  . Never Used    Labs: Recent  Review Flowsheet Data    Labs for ITP Cardiac and Pulmonary Rehab Latest Ref Rng 01/07/2015 12/19/2015   Cholestrol 0 - 200 mg/dL 131 120   LDLCALC 0 - 99 mg/dL 72 73   HDL >40 mg/dL 48 38(L)   Trlycerides <150 mg/dL 56 46      Capillary Blood Glucose: No results found for: GLUCAP   Exercise Target Goals:    Exercise Program Goal: Individual exercise prescription set with THRR, safety & activity barriers. Participant demonstrates ability to understand and report RPE using BORG scale, to self-measure pulse accurately, and to acknowledge the importance of the exercise prescription.  Exercise Prescription Goal: Starting with aerobic activity 30 plus minutes a day, 3 days per week for initial exercise prescription. Provide home exercise prescription and guidelines that participant acknowledges understanding prior to discharge.  Activity Barriers & Risk Stratification:     Activity Barriers & Cardiac Risk Stratification - 03/08/16 0925    Activity Barriers & Cardiac Risk Stratification   Activity Barriers Muscular Weakness;Other (comment)   Comments minor aches and pain   Cardiac Risk Stratification High      6 Minute Walk:     6 Minute Walk      03/08/16 1009 03/08/16 1220     6 Minute Walk   Phase Initial  Distance 1482 feet     Walk Time 6 minutes     # of Rest Breaks 0     MPH  2.8    METS  4.1    RPE  12    VO2 Peak  14.4    Symptoms  No    Resting HR 771 bpm     Resting BP 130/86 mmHg     Max Ex. HR 93 bpm     Max Ex. BP 136/86 mmHg     2 Minute Post BP 139/86 mmHg        Initial Exercise Prescription:     Initial Exercise Prescription - 03/14/16 1400    Date of Initial Exercise RX and Referring Provider   Date 03/08/16   Referring Provider Skeet Latch MD   Treadmill   MPH 2   Grade 1   Minutes 10   METs 2.81   Bike   Level 0.8   Minutes 10   METs 2   NuStep   Level 3   Minutes 10   METs 2   Prescription Details   Frequency (times  per week) 3   Intensity   THRR 40-80% of Max Heartrate 68-136   Ratings of Perceived Exertion 11-13   Progression   Progression Continue to progress workloads to maintain intensity without signs/symptoms of physical distress.   Resistance Training   Training Prescription Yes   Weight 2   Reps 10-12      Perform Capillary Blood Glucose checks as needed.  Exercise Prescription Changes:   Exercise Comments:   Discharge Exercise Prescription (Final Exercise Prescription Changes):   Nutrition:  Target Goals: Understanding of nutrition guidelines, daily intake of sodium <1573m, cholesterol <2042m calories 30% from fat and 7% or less from saturated fats, daily to have 5 or more servings of fruits and vegetables.  Biometrics:     Pre Biometrics - 03/08/16 1224    Pre Biometrics   % Body Fat 37.5 %       Nutrition Therapy Plan and Nutrition Goals:     Nutrition Therapy & Goals - 03/12/16 0925    Nutrition Therapy   Diet Therapeutic Lifestyle Changes   Personal Nutrition Goals   Personal Goal #1 0.5-2 lb wt loss per week to a goal wt loss of 6-24 lb at graduation from [facility] and counsel regarding individualized specific dietary modifications aiming towards targeted core components such as weight, hypertension, lipid management, diabetes, heart failure and other comorbidities.   Expected Outcomes Short Term Goal: Understand basic principles of dietary content, such as calories, fat, sodium, cholesterol and nutrients.;Long Term Goal: Adherence to prescribed nutrition plan.      Nutrition Discharge: Nutrition Scores:     Nutrition Assessments - 03/12/16 0925    MEDFICTS Scores   Pre Score 121      Nutrition Goals Re-Evaluation:   Psychosocial: Target Goals: Acknowledge presence or absence of depression, maximize coping skills, provide positive support system. Participant is able to verbalize types and  ability to use techniques and skills needed for reducing stress and depression.  Initial Review & Psychosocial Screening:     Initial Psych Review & Screening - 03/12/16 1701    Initial Review   Current issues with Current Stress Concerns   Source of Stress Concerns Chronic Illness;Unable to perform yard/household activities   FaMaloYes   Barriers   Psychosocial barriers  to participate in program The patient should benefit from training in stress management and relaxation.   Screening Interventions   Interventions Encouraged to exercise;Program counselor consult      Quality of Life Scores:     Quality of Life - 03/08/16 1355    Quality of Life Scores   Health/Function Pre 29.75 %   Socioeconomic Pre 29.83 %   Psych/Spiritual Pre 30 %   Family Pre 30 %   GLOBAL Pre 28.8 %      PHQ-9:     Recent Review Flowsheet Data    There is no flowsheet data to display.      Psychosocial Evaluation and Intervention:   Psychosocial Re-Evaluation:   Vocational Rehabilitation: Provide vocational rehab assistance to qualifying candidates.   Vocational Rehab Evaluation & Intervention:     Vocational Rehab - 03/08/16 1439    Initial Vocational Rehab Evaluation & Intervention   Assessment shows need for Vocational Rehabilitation No  pt can return to her job without any difficulties      Education: Education Goals: Education classes will be provided on a weekly basis, covering required topics. Participant will state understanding/return demonstration of topics presented.  Learning Barriers/Preferences:     Learning Barriers/Preferences - 03/08/16 0926    Learning Barriers/Preferences   Learning Barriers Sight   Learning Preferences Pictoral;Skilled Demonstration;Verbal Instruction;Written Material;Video      Education Topics: Count Your Pulse:  -Group instruction provided by verbal instruction, demonstration, patient participation  and written materials to support subject.  Instructors address importance of being able to find your pulse and how to count your pulse when at home without a heart monitor.  Patients get hands on experience counting their pulse with staff help and individually.   Heart Attack, Angina, and Risk Factor Modification:  -Group instruction provided by verbal instruction, video, and written materials to support subject.  Instructors address signs and symptoms of angina and heart attacks.    Also discuss risk factors for heart disease and how to make changes to improve heart health risk factors.   Functional Fitness:  -Group instruction provided by verbal instruction, demonstration, patient participation, and written materials to support subject.  Instructors address safety measures for doing things around the house.  Discuss how to get up and down off the floor, how to pick things up properly, how to safely get out of a chair without assistance, and balance training.   Meditation and Mindfulness:  -Group instruction provided by verbal instruction, patient participation, and written materials to support subject.  Instructor addresses importance of mindfulness and meditation practice to help reduce stress and improve awareness.  Instructor also leads participants through a meditation exercise.    Stretching for Flexibility and Mobility:  -Group instruction provided by verbal instruction, patient participation, and written materials to support subject.  Instructors lead participants through series of stretches that are designed to increase flexibility thus improving mobility.  These stretches are additional exercise for major muscle groups that are typically performed during regular warm up and cool down.          CARDIAC REHAB PHASE II EXERCISE from 03/21/2016 in [facility]   Date  03/21/16   Educator  Luetta Nutting Iline Oven   Instruction Review Code  2- meets  goals/outcomes      Hands Only CPR Anytime:  -Group instruction provided by verbal instruction, video, patient participation and written materials to support subject.  Instructors co-teach with AHA video for hands only CPR.  Participants get hands on experience with mannequins.      CARDIAC REHAB PHASE II EXERCISE from 03/21/2016 in [facility]   Date  03/16/16   Instruction Review Code  2- meets goals/outcomes      Nutrition I class: Heart Healthy Eating:  -Group instruction provided by PowerPoint slides, verbal discussion, and written materials to support subject matter. The instructor gives an explanation and review of the Therapeutic Lifestyle Changes diet recommendations, which includes a discussion on lipid goals, dietary fat, sodium, fiber, plant stanol/sterol esters, sugar, and the components of a well-balanced, healthy diet.   Nutrition II class: Lifestyle Skills:  -Group instruction provided by PowerPoint slides, verbal discussion, and written materials to support subject matter. The instructor gives an explanation and review of label reading, grocery shopping for heart health, heart healthy recipe modifications, and ways to make healthier choices when eating out.   Diabetes Question & Answer:  -Group instruction provided by PowerPoint slides, verbal discussion, and written materials to support subject matter. The instructor gives an explanation and review of diabetes co-morbidities, pre- and post-prandial blood glucose goals, pre-exercise blood glucose goals, signs, symptoms, and treatment of hypoglycemia and hyperglycemia, and foot care basics.   Diabetes Blitz:  -Group instruction provided by PowerPoint slides, verbal discussion, and written materials to support subject matter. The instructor gives an explanation and review of the physiology behind type 1 and type 2 diabetes, diabetes medications and rational behind using different medications, pre-  and post-prandial blood glucose recommendations and Hemoglobin A1c goals, diabetes diet, and exercise including blood glucose guidelines for exercising safely.    Portion Distortion:  -Group instruction provided by PowerPoint slides, verbal discussion, written materials, and food models to support subject matter. The instructor gives an explanation of serving size versus portion size, changes in portions sizes over the last 20 years, and what consists of a serving from each food group.   Stress Management:  -Group instruction provided by verbal instruction, video, and written materials to support subject matter.  Instructors review role of stress in heart disease and how to cope with stress positively.     Exercising on Your Own:  -Group instruction provided by verbal instruction, power point, and written materials to support subject.  Instructors discuss benefits of exercise, components of exercise, frequency and intensity of exercise, and end points for exercise.  Also discuss use of nitroglycerin and activating EMS.  Review options of places to exercise outside of rehab.  Review guidelines for sex with heart disease.   Cardiac Drugs I:  -Group instruction provided by verbal instruction and written materials to support subject.  Instructor reviews cardiac drug classes: antiplatelets, anticoagulants, beta blockers, and statins.  Instructor discusses reasons, side effects, and lifestyle considerations for each drug class.      CARDIAC REHAB PHASE II EXERCISE from 03/21/2016 in [facility]   Date  03/14/16   Educator  pharm D   Instruction Review Code  2- meets goals/outcomes      Cardiac Drugs II:  -Group instruction provided by verbal instruction and written materials to support subject.  Instructor reviews cardiac drug classes: angiotensin converting enzyme inhibitors (ACE-I), angiotensin II receptor blockers (ARBs), nitrates, and calcium channel blockers.   Instructor discusses reasons, side effects, and lifestyle considerations for each drug class.   Anatomy and Physiology of the Circulatory System:  -Group instruction provided by verbal instruction, video, and written materials to support subject.  Reviews functional anatomy of heart,  how it relates to various diagnoses, and what role the heart plays in the overall system.   Knowledge Questionnaire Score:     Knowledge Questionnaire Score - 03/08/16 1219    Knowledge Questionnaire Score   Pre Score 19/24      Core Components/Risk Factors/Patient Goals at Admission:     Personal Goals and Risk Factors at Admission - 03/08/16 0926    Core Components/Risk Factors/Patient Goals on Admission    Weight Management Yes;Weight Loss   Intervention Weight Management: Develop a combined nutrition and exercise program designed to reach desired caloric intake, while maintaining appropriate intake of nutrient and fiber, sodium and fats, and appropriate energy expenditure required for the weight goal.;Weight Management: Provide education and appropriate resources to help participant work on and attain dietary goals.;Weight Management/Obesity: Establish reasonable short term and long term weight goals.   Expected Outcomes Short Term: Continue to assess and modify interventions until short term weight is achieved;Weight Maintenance: Understanding of the daily nutrition guidelines, which includes 25-35% calories from fat, 7% or less cal from saturated fats, less than 210m cholesterol, less than 1.5gm of sodium, & 5 or more servings of fruits and vegetables daily;Weight Loss: Understanding of general recommendations for a balanced deficit meal plan, which promotes 1-2 lb weight loss per week and includes a negative energy balance of [phone] kcal/d;Understanding recommendations for meals to include 15-35% energy as protein, 25-35% energy from fat, 35-60% energy from carbohydrates, less than 2052mof dietary  cholesterol, 20-35 gm of total fiber daily;Understanding of distribution of calorie intake throughout the day with the consumption of 4-5 meals/snacks   Increase Strength and Stamina Yes   Intervention Provide advice, education, support and counseling about physical activity/exercise needs.;Develop an individualized exercise prescription for aerobic and resistive training based on initial evaluation findings, risk stratification, comorbidities and participant's personal goals.   Expected Outcomes Achievement of increased cardiorespiratory fitness and enhanced flexibility, muscular endurance and strength shown through measurements of functional capacity and personal statement of participant.   Improve shortness of breath with ADL's Yes   Personal Goal Other Yes   Personal Goal short: be heart healthy ( no cardiac events or hospitalizations) eat healthier   long: be able to walk with less SOB and increase energy. Lose weight goal 145-150lbs   Intervention provide nutrition education and exercise programming to assist with weightloss, increased energy and building exercise capacity. Also increased cardiac awareness and risks factors to decrease future cardiac events and hospitalizations   Expected Outcomes be able to walk with little to no SOB, lose weight and no future cardiac events      Core Components/Risk Factors/Patient Goals Review:    Core Components/Risk Factors/Patient Goals at Discharge (Final Review):    ITP Comments:     ITP Comments      03/08/16 0921           ITP Comments Dr. TrFransico Himmedical director          Comments: Pt is making expected progress toward personal goals after completing 6 sessions. Recommend continued exercise and life style modification education including  stress management and relaxation techniques to decrease cardiac risk profile.

## 2016-03-26 ENCOUNTER — Encounter (HOSPITAL_COMMUNITY)
Admission: RE | Admit: 2016-03-26 | Discharge: 2016-03-26 | Disposition: A | Discharge status: Home / Self Care | Payer: BLUE CROSS/BLUE SHIELD | Source: Ambulatory Visit | Attending: Cardiovascular Disease | Admitting: Cardiovascular Disease

## 2016-03-26 DIAGNOSIS — I214 Non-ST elevation (NSTEMI) myocardial infarction: Secondary | ICD-10-CM

## 2016-03-26 DIAGNOSIS — K5090 Crohn's disease, unspecified, without complications: Secondary | ICD-10-CM | POA: Diagnosis not present

## 2016-03-26 DIAGNOSIS — I252 Old myocardial infarction: Secondary | ICD-10-CM | POA: Diagnosis not present

## 2016-03-26 DIAGNOSIS — Z79899 Other long term (current) drug therapy: Secondary | ICD-10-CM | POA: Diagnosis not present

## 2016-03-26 DIAGNOSIS — I119 Hypertensive heart disease without heart failure: Secondary | ICD-10-CM | POA: Diagnosis not present

## 2016-03-26 NOTE — Progress Notes (Signed)
Reviewed home exercise with pt today.  Pt plans to walk for exercise.  Reviewed THR, pulse, RPE, sign and symptoms, NTG use, and when to call 911 or MD.  Also discussed weather considerations and indoor options.  Pt voiced understanding.    Corita Allinson [employer]

## 2016-03-27 NOTE — Telephone Encounter (Signed)
Left message to call back  

## 2016-03-28 ENCOUNTER — Encounter (HOSPITAL_COMMUNITY)
Admission: RE | Admit: 2016-03-28 | Discharge: 2016-03-28 | Disposition: A | Discharge status: Home / Self Care | Payer: BLUE CROSS/BLUE SHIELD | Source: Ambulatory Visit | Attending: Cardiovascular Disease | Admitting: Cardiovascular Disease

## 2016-03-28 DIAGNOSIS — I119 Hypertensive heart disease without heart failure: Secondary | ICD-10-CM | POA: Diagnosis not present

## 2016-03-28 DIAGNOSIS — I252 Old myocardial infarction: Secondary | ICD-10-CM | POA: Diagnosis not present

## 2016-03-28 DIAGNOSIS — Z79899 Other long term (current) drug therapy: Secondary | ICD-10-CM | POA: Diagnosis not present

## 2016-03-28 DIAGNOSIS — K5090 Crohn's disease, unspecified, without complications: Secondary | ICD-10-CM | POA: Diagnosis not present

## 2016-03-28 DIAGNOSIS — I214 Non-ST elevation (NSTEMI) myocardial infarction: Secondary | ICD-10-CM

## 2016-03-30 ENCOUNTER — Encounter (HOSPITAL_COMMUNITY)
Admission: RE | Admit: 2016-03-30 | Discharge: 2016-03-30 | Disposition: A | Discharge status: Home / Self Care | Payer: BLUE CROSS/BLUE SHIELD | Source: Ambulatory Visit | Attending: Cardiovascular Disease | Admitting: Cardiovascular Disease

## 2016-03-30 ENCOUNTER — Telehealth: Payer: Self-pay | Admitting: Family Medicine

## 2016-03-30 DIAGNOSIS — H04123 Dry eye syndrome of bilateral lacrimal glands: Secondary | ICD-10-CM | POA: Diagnosis not present

## 2016-03-30 DIAGNOSIS — H2513 Age-related nuclear cataract, bilateral: Secondary | ICD-10-CM | POA: Diagnosis not present

## 2016-03-30 DIAGNOSIS — H5213 Myopia, bilateral: Secondary | ICD-10-CM | POA: Diagnosis not present

## 2016-03-30 DIAGNOSIS — I252 Old myocardial infarction: Secondary | ICD-10-CM | POA: Diagnosis not present

## 2016-03-30 DIAGNOSIS — K5090 Crohn's disease, unspecified, without complications: Secondary | ICD-10-CM | POA: Diagnosis not present

## 2016-03-30 DIAGNOSIS — Z79899 Other long term (current) drug therapy: Secondary | ICD-10-CM | POA: Diagnosis not present

## 2016-03-30 DIAGNOSIS — H43393 Other vitreous opacities, bilateral: Secondary | ICD-10-CM | POA: Diagnosis not present

## 2016-03-30 DIAGNOSIS — I119 Hypertensive heart disease without heart failure: Secondary | ICD-10-CM | POA: Diagnosis not present

## 2016-03-30 DIAGNOSIS — I214 Non-ST elevation (NSTEMI) myocardial infarction: Secondary | ICD-10-CM

## 2016-03-30 NOTE — Telephone Encounter (Signed)
Left message @ [phone] informing patient that her appointment on July 21 with Dr. Carollee Herter has been cancelled and needs to be rescheduled.

## 2016-04-02 ENCOUNTER — Ambulatory Visit (HOSPITAL_COMMUNITY): Admission: RE | Admit: 2016-04-02 | Payer: BLUE CROSS/BLUE SHIELD | Source: Ambulatory Visit

## 2016-04-02 ENCOUNTER — Ambulatory Visit (HOSPITAL_COMMUNITY)
Admission: RE | Admit: 2016-04-02 | Discharge: 2016-04-02 | Disposition: A | Discharge status: Home / Self Care | Payer: BLUE CROSS/BLUE SHIELD | Source: Ambulatory Visit | Attending: Cardiovascular Disease | Admitting: Cardiovascular Disease

## 2016-04-02 ENCOUNTER — Ambulatory Visit (HOSPITAL_COMMUNITY): Payer: BLUE CROSS/BLUE SHIELD

## 2016-04-02 DIAGNOSIS — J9811 Atelectasis: Secondary | ICD-10-CM | POA: Diagnosis not present

## 2016-04-02 DIAGNOSIS — R209 Unspecified disturbances of skin sensation: Secondary | ICD-10-CM | POA: Diagnosis not present

## 2016-04-02 DIAGNOSIS — M419 Scoliosis, unspecified: Secondary | ICD-10-CM | POA: Insufficient documentation

## 2016-04-02 DIAGNOSIS — R221 Localized swelling, mass and lump, neck: Secondary | ICD-10-CM | POA: Diagnosis not present

## 2016-04-02 DIAGNOSIS — K029 Dental caries, unspecified: Secondary | ICD-10-CM | POA: Diagnosis not present

## 2016-04-02 DIAGNOSIS — K0889 Other specified disorders of teeth and supporting structures: Secondary | ICD-10-CM | POA: Insufficient documentation

## 2016-04-02 DIAGNOSIS — I773 Arterial fibromuscular dysplasia: Secondary | ICD-10-CM

## 2016-04-02 DIAGNOSIS — R0989 Other specified symptoms and signs involving the circulatory and respiratory systems: Secondary | ICD-10-CM | POA: Diagnosis not present

## 2016-04-02 MED ORDER — IOPAMIDOL (ISOVUE-370) INJECTION 76%
INTRAVENOUS | Status: AC
  Administered 2016-04-02: 100 mL
  Filled 2016-04-02: qty 100

## 2016-04-04 ENCOUNTER — Encounter (HOSPITAL_COMMUNITY)
Admission: RE | Admit: 2016-04-04 | Discharge: 2016-04-04 | Disposition: A | Discharge status: Home / Self Care | Payer: BLUE CROSS/BLUE SHIELD | Source: Ambulatory Visit | Attending: Cardiovascular Disease | Admitting: Cardiovascular Disease

## 2016-04-04 ENCOUNTER — Other Ambulatory Visit: Payer: Self-pay

## 2016-04-04 DIAGNOSIS — E215 Disorder of parathyroid gland, unspecified: Secondary | ICD-10-CM

## 2016-04-04 DIAGNOSIS — I119 Hypertensive heart disease without heart failure: Secondary | ICD-10-CM | POA: Diagnosis not present

## 2016-04-04 DIAGNOSIS — Z7982 Long term (current) use of aspirin: Secondary | ICD-10-CM | POA: Insufficient documentation

## 2016-04-04 DIAGNOSIS — I214 Non-ST elevation (NSTEMI) myocardial infarction: Secondary | ICD-10-CM

## 2016-04-04 DIAGNOSIS — I252 Old myocardial infarction: Secondary | ICD-10-CM | POA: Insufficient documentation

## 2016-04-04 DIAGNOSIS — K5090 Crohn's disease, unspecified, without complications: Secondary | ICD-10-CM | POA: Diagnosis not present

## 2016-04-04 DIAGNOSIS — Z79899 Other long term (current) drug therapy: Secondary | ICD-10-CM | POA: Diagnosis not present

## 2016-04-05 ENCOUNTER — Encounter (HOSPITAL_COMMUNITY): Payer: Self-pay

## 2016-04-05 ENCOUNTER — Telehealth: Payer: Self-pay | Admitting: Cardiovascular Disease

## 2016-04-05 ENCOUNTER — Ambulatory Visit (HOSPITAL_COMMUNITY)
Admission: RE | Admit: 2016-04-05 | Discharge: 2016-04-05 | Disposition: A | Discharge status: Home / Self Care | Payer: BLUE CROSS/BLUE SHIELD | Source: Ambulatory Visit | Attending: Cardiovascular Disease | Admitting: Cardiovascular Disease

## 2016-04-05 DIAGNOSIS — I773 Arterial fibromuscular dysplasia: Secondary | ICD-10-CM | POA: Diagnosis not present

## 2016-04-05 DIAGNOSIS — D259 Leiomyoma of uterus, unspecified: Secondary | ICD-10-CM | POA: Diagnosis not present

## 2016-04-05 MED ORDER — IOPAMIDOL (ISOVUE-370) INJECTION 76%
INTRAVENOUS | Status: AC
  Administered 2016-04-05: 100 mL
  Filled 2016-04-05: qty 100

## 2016-04-05 NOTE — Telephone Encounter (Signed)
Level I Appeal faxed to [facility] w/pt's permission form for Cardiac CTA to [phone] [id number].

## 2016-04-06 ENCOUNTER — Encounter (HOSPITAL_COMMUNITY)
Admission: RE | Admit: 2016-04-06 | Discharge: 2016-04-06 | Disposition: A | Discharge status: Home / Self Care | Payer: BLUE CROSS/BLUE SHIELD | Source: Ambulatory Visit | Attending: Cardiovascular Disease | Admitting: Cardiovascular Disease

## 2016-04-06 DIAGNOSIS — I214 Non-ST elevation (NSTEMI) myocardial infarction: Secondary | ICD-10-CM

## 2016-04-06 DIAGNOSIS — I252 Old myocardial infarction: Secondary | ICD-10-CM | POA: Diagnosis not present

## 2016-04-06 DIAGNOSIS — Z79899 Other long term (current) drug therapy: Secondary | ICD-10-CM | POA: Diagnosis not present

## 2016-04-06 DIAGNOSIS — I119 Hypertensive heart disease without heart failure: Secondary | ICD-10-CM | POA: Diagnosis not present

## 2016-04-06 DIAGNOSIS — K5090 Crohn's disease, unspecified, without complications: Secondary | ICD-10-CM | POA: Diagnosis not present

## 2016-04-06 LAB — POCT I-STAT CREATININE: CREATININE: 0.9 mg/dL (ref 0.44–1.00)

## 2016-04-09 ENCOUNTER — Encounter (HOSPITAL_COMMUNITY)
Admission: RE | Admit: 2016-04-09 | Discharge: 2016-04-09 | Disposition: A | Discharge status: Home / Self Care | Payer: BLUE CROSS/BLUE SHIELD | Source: Ambulatory Visit | Attending: Cardiovascular Disease | Admitting: Cardiovascular Disease

## 2016-04-09 DIAGNOSIS — I119 Hypertensive heart disease without heart failure: Secondary | ICD-10-CM | POA: Diagnosis not present

## 2016-04-09 DIAGNOSIS — I252 Old myocardial infarction: Secondary | ICD-10-CM | POA: Diagnosis not present

## 2016-04-09 DIAGNOSIS — Z79899 Other long term (current) drug therapy: Secondary | ICD-10-CM | POA: Diagnosis not present

## 2016-04-09 DIAGNOSIS — K5090 Crohn's disease, unspecified, without complications: Secondary | ICD-10-CM | POA: Diagnosis not present

## 2016-04-09 DIAGNOSIS — I214 Non-ST elevation (NSTEMI) myocardial infarction: Secondary | ICD-10-CM

## 2016-04-11 ENCOUNTER — Encounter (HOSPITAL_COMMUNITY)
Admission: RE | Admit: 2016-04-11 | Discharge: 2016-04-11 | Disposition: A | Discharge status: Home / Self Care | Payer: BLUE CROSS/BLUE SHIELD | Source: Ambulatory Visit | Attending: Cardiovascular Disease | Admitting: Cardiovascular Disease

## 2016-04-11 ENCOUNTER — Telehealth: Payer: Self-pay | Admitting: Cardiovascular Disease

## 2016-04-11 DIAGNOSIS — I214 Non-ST elevation (NSTEMI) myocardial infarction: Secondary | ICD-10-CM

## 2016-04-11 DIAGNOSIS — Z79899 Other long term (current) drug therapy: Secondary | ICD-10-CM | POA: Diagnosis not present

## 2016-04-11 DIAGNOSIS — I252 Old myocardial infarction: Secondary | ICD-10-CM | POA: Diagnosis not present

## 2016-04-11 DIAGNOSIS — I119 Hypertensive heart disease without heart failure: Secondary | ICD-10-CM | POA: Diagnosis not present

## 2016-04-11 DIAGNOSIS — K5090 Crohn's disease, unspecified, without complications: Secondary | ICD-10-CM | POA: Diagnosis not present

## 2016-04-11 NOTE — Telephone Encounter (Signed)
Received incoming call from [facility] at [place]. Patient had c/o episode that occurred last night at home where she said she had left arm pain, starting in her chest to her arm and lasted about 1 hour.  She took a tramadol and the pain went away. Patient completed cardiac rehab today with no symptoms. Patient has appt with Oval Linsey on 04/26/16.  Will route to Dr Oval Linsey for review.

## 2016-04-11 NOTE — Progress Notes (Signed)
Melody Gardner 50 y.o. female Nutrition Note Spoke with pt. Nutrition Plan and Nutrition Survey goals reviewed with pt. Pt is currently not following the Therapeutic Lifestyle Changes diet. Per discussion, pt unsure when to follow a low fiber diet. Pt reports she has had Crohn's for about 1 year. Pt denies abd pain or bloody diarrhea. Pt states she does have blood in her stool from time to time. Pt denies any h/o wt loss or diarrhea. Pt educated when a low fiber diet is indicated with Crohn's disease. Pt wants to lose wt. Wt loss tips briefly reviewed. Pt expressed understanding of the information reviewed. Pt aware of nutrition education classes offered.  No results found for: HGBA1C Wt Readings from Last 3 Encounters:  03/20/16 172 lb (78.019 kg)  03/08/16 172 lb 2.9 oz (78.1 kg)  02/07/16 169 lb (76.658 kg)    Nutrition Diagnosis ? Food-and nutrition-related knowledge deficit related to lack of exposure to information as related to diagnosis of: ? CVD  ? Overweight related to excessive energy intake as evidenced by a BMI of 29.4  Nutrition Intervention ? Pt's individual nutrition plan reviewed with pt. ? Benefits of adopting Therapeutic Lifestyle Changes discussed when Medficts reviewed. ? Pt to attend the Portion Distortion class  ? Pt given handouts for: ? Nutrition I class ? Nutrition II class ? Low Fiber Nutrition Therapy ? Continue client-centered nutrition education by RD, as part of interdisciplinary care. Goal(s) ? Pt to identify and limit food sources of saturated fat, trans fat, and sodium ? Pt to identify food quantities necessary to achieve weight loss of 6-24 lb (2.7-10.9 kg) at graduation from cardiac rehab.  Monitor and Evaluate progress toward nutrition goal with team. Derek Mound, M.Ed, RD, LDN, CDE 04/11/2016 2:15 PM

## 2016-04-13 ENCOUNTER — Encounter (HOSPITAL_COMMUNITY)
Admission: RE | Admit: 2016-04-13 | Discharge: 2016-04-13 | Disposition: A | Discharge status: Home / Self Care | Payer: BLUE CROSS/BLUE SHIELD | Source: Ambulatory Visit | Attending: Cardiovascular Disease | Admitting: Cardiovascular Disease

## 2016-04-13 DIAGNOSIS — Z79899 Other long term (current) drug therapy: Secondary | ICD-10-CM | POA: Diagnosis not present

## 2016-04-13 DIAGNOSIS — I119 Hypertensive heart disease without heart failure: Secondary | ICD-10-CM | POA: Diagnosis not present

## 2016-04-13 DIAGNOSIS — K5090 Crohn's disease, unspecified, without complications: Secondary | ICD-10-CM | POA: Diagnosis not present

## 2016-04-13 DIAGNOSIS — I214 Non-ST elevation (NSTEMI) myocardial infarction: Secondary | ICD-10-CM

## 2016-04-13 DIAGNOSIS — I252 Old myocardial infarction: Secondary | ICD-10-CM | POA: Diagnosis not present

## 2016-04-13 NOTE — Progress Notes (Signed)
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 Pt diastolic average is 14-43Q.  Pt asymptomatic. Pt highest reading today 170/100 at rest pre-exercise after pt rushed to get here. Dr. Oval Linsey made aware.  Rehab report faxed to office for her review.  Will continue to monitor.

## 2016-04-13 NOTE — Progress Notes (Signed)
Cardiac Individual Treatment Plan  Patient Details  Name: Melody Gardner MRN: [id number] Date of Birth: 09-Aug-1966 Referring Provider:        CARDIAC REHAB PHASE II EXERCISE from 03/14/2016 in [place]   Referring Provider  Skeet Latch MD      Initial Encounter Date:       CARDIAC REHAB PHASE II EXERCISE from 03/14/2016 in [place]   Date  03/08/16   Referring Provider  Skeet Latch MD      Visit Diagnosis: NSTEMI (non-ST elevated myocardial infarction) [facility])  Patient's Home Medications on Admission:  Current outpatient prescriptions:  .  aspirin EC 81 MG tablet, Take 1 tablet (81 mg total) by mouth daily., Disp: 30 tablet, Rfl: 12 .  azaTHIOprine (IMURAN) 50 MG tablet, Take 50 mg by mouth 3 (three) times daily., Disp: , Rfl:  .  balsalazide (COLAZAL) 750 MG capsule, Take 2,250 mg by mouth 3 (three) times daily., Disp: , Rfl:  .  losartan (COZAAR) 25 MG tablet, Take 1 tablet (25 mg total) by mouth daily., Disp: 30 tablet, Rfl: 3 .  Multiple Vitamins-Minerals (WOMENS ONE DAILY PO), Take 1 tablet by mouth daily., Disp: , Rfl:  .  nebivolol (BYSTOLIC) 2.5 MG tablet, Take 1 tablet (2.5 mg total) by mouth daily., Disp: 30 tablet, Rfl: 3 .  nitroGLYCERIN (NITROSTAT) 0.4 MG SL tablet, Place 1 tablet (0.4 mg total) under the tongue every 5 (five) minutes as needed for chest pain., Disp: 25 tablet, Rfl: 1 .  traMADol (ULTRAM) 50 MG tablet, Take 1 tablet (50 mg total) by mouth every 6 (six) hours as needed., Disp: 15 tablet, Rfl: 0  Past Medical History: Past Medical History  Diagnosis Date  . Hypertension 2012  . Anemia 2013  . Depression     pt states she is depressed but not medically diagnosed  . Crohn's disease ([place])   . Acute MI, lateral wall, initial episode of care ([place]) 12/18/2015  . Hypertensive heart disease 12/19/2015    Tobacco Use: History  Smoking status  . Never Smoker   Smokeless tobacco  .  Never Used    Labs:     Recent Review Flowsheet Data    Labs for ITP Cardiac and Pulmonary Rehab Latest Ref Rng 01/07/2015 12/19/2015   Cholestrol 0 - 200 mg/dL 131 120   LDLCALC 0 - 99 mg/dL 72 73   HDL >40 mg/dL 48 38(L)   Trlycerides <150 mg/dL 56 46      Capillary Blood Glucose: No results found for: GLUCAP   Exercise Target Goals:    Exercise Program Goal: Individual exercise prescription set with THRR, safety & activity barriers. Participant demonstrates ability to understand and report RPE using BORG scale, to self-measure pulse accurately, and to acknowledge the importance of the exercise prescription.  Exercise Prescription Goal: Starting with aerobic activity 30 plus minutes a day, 3 days per week for initial exercise prescription. Provide home exercise prescription and guidelines that participant acknowledges understanding prior to discharge.  Activity Barriers & Risk Stratification:     Activity Barriers & Cardiac Risk Stratification - 03/08/16 0925    Activity Barriers & Cardiac Risk Stratification   Activity Barriers Muscular Weakness;Other (comment)   Comments minor aches and pain   Cardiac Risk Stratification High      6 Minute Walk:     6 Minute Walk      03/08/16 1009 03/08/16 1220     6 Minute  Walk   Phase Initial     Distance 1482 feet     Walk Time 6 minutes     # of Rest Breaks 0     MPH  2.8    METS  4.1    RPE  12    VO2 Peak  14.4    Symptoms  No    Resting HR 771 bpm     Resting BP 130/86 mmHg     Max Ex. HR 93 bpm     Max Ex. BP 136/86 mmHg     2 Minute Post BP 139/86 mmHg        Initial Exercise Prescription:     Initial Exercise Prescription - 03/14/16 1400    Date of Initial Exercise RX and Referring Provider   Date 03/08/16   Referring Provider Skeet Latch MD   Treadmill   MPH 2   Grade 1   Minutes 10   METs 2.81   Bike   Level 0.8   Minutes 10   METs 2   NuStep   Level 3   Minutes 10   METs 2    Prescription Details   Frequency (times per week) 3   Intensity   THRR 40-80% of Max Heartrate 68-136   Ratings of Perceived Exertion 11-13   Progression   Progression Continue to progress workloads to maintain intensity without signs/symptoms of physical distress.   Resistance Training   Training Prescription Yes   Weight 2   Reps 10-12      Perform Capillary Blood Glucose checks as needed.  Exercise Prescription Changes:      Exercise Prescription Changes      04/02/16 1200 04/09/16 1600         Exercise Review   Progression  Yes      Response to Exercise   Blood Pressure (Admit) 104/60 mmHg 106/70 mmHg      Blood Pressure (Exercise) 127/81 mmHg 138/79 mmHg      Blood Pressure (Exit) 135/85 mmHg 122/82 mmHg      Heart Rate (Admit) 71 bpm 69 bpm      Heart Rate (Exercise) 117 bpm 105 bpm      Heart Rate (Exit) 78 bpm 69 bpm      Duration Progress to 30 minutes of continuous aerobic without signs/symptoms of physical distress Progress to 30 minutes of continuous aerobic without signs/symptoms of physical distress      Intensity THRR unchanged THRR unchanged      Progression   Progression Continue to progress workloads to maintain intensity without signs/symptoms of physical distress. Continue to progress workloads to maintain intensity without signs/symptoms of physical distress.      Average METs 2.6 3.2      Resistance Training   Training Prescription Yes Yes      Weight 5lbs 5lbs      Reps 10-12 10-12      Treadmill   MPH 2.5 2.5      Grade 1 1      Minutes 10 10      METs 3.26 3.26      Bike   Level 0.8 0.8      Minutes 10 10      METs 2.92 2.92      NuStep   Level 4 4      Minutes 10 10      METs 2.1 3.1      Home Exercise Plan   Plans to continue exercise at (  p) Home  Reviewed HEP on 6/23. Pt plans to walk at home for exercise Home  Reviewed HEP on 6/23. Pt plans to walk at home for exercise      Frequency (p) Add 2 additional days to program exercise  sessions. Add 2 additional days to program exercise sessions.         Exercise Comments:      Exercise Comments      04/09/16 1609           Exercise Comments Reviewed METS and goals. Pt is tolerating exercise well; will continue to monitor exercise progression          Discharge Exercise Prescription (Final Exercise Prescription Changes):     Exercise Prescription Changes - 04/09/16 1600    Exercise Review   Progression Yes   Response to Exercise   Blood Pressure (Admit) 106/70 mmHg   Blood Pressure (Exercise) 138/79 mmHg   Blood Pressure (Exit) 122/82 mmHg   Heart Rate (Admit) 69 bpm   Heart Rate (Exercise) 105 bpm   Heart Rate (Exit) 69 bpm   Duration Progress to 30 minutes of continuous aerobic without signs/symptoms of physical distress   Intensity THRR unchanged   Progression   Progression Continue to progress workloads to maintain intensity without signs/symptoms of physical distress.   Average METs 3.2   Resistance Training   Training Prescription Yes   Weight 5lbs   Reps 10-12   Treadmill   MPH 2.5   Grade 1   Minutes 10   METs 3.26   Bike   Level 0.8   Minutes 10   METs 2.92   NuStep   Level 4   Minutes 10   METs 3.1   Home Exercise Plan   Plans to continue exercise at Home  Reviewed HEP on 6/23. Pt plans to walk at home for exercise   Frequency Add 2 additional days to program exercise sessions.      Nutrition:  Target Goals: Understanding of nutrition guidelines, daily intake of sodium <1576m, cholesterol <2086m calories 30% from fat and 7% or less from saturated fats, daily to have 5 or more servings of fruits and vegetables.  Biometrics:     Pre Biometrics - 03/08/16 1224    Pre Biometrics   % Body Fat 37.5 %       Nutrition Therapy Plan and Nutrition Goals:     Nutrition Therapy & Goals - 03/12/16 0925    Nutrition Therapy   Diet Therapeutic Lifestyle Changes   Personal Nutrition Goals   Personal Goal #1 0.5-2 lb wt loss  per week to a goal wt loss of 6-24 lb [facility] [place] [facility] regarding individualized specific dietary modifications aiming towards targeted core components such as weight, hypertension, lipid management, diabetes, heart failure and other comorbidities.   Expected Outcomes Short Term Goal: Understand basic principles of dietary content, such as calories, fat, sodium, cholesterol and nutrients.;Long Term Goal: Adherence to prescribed nutrition plan.      Nutrition Discharge: Nutrition Scores:     Nutrition Assessments - 03/12/16 0925    MEDFICTS Scores   Pre Score 121      Nutrition Goals Re-Evaluation:     Nutrition Goals Re-Evaluation      04/11/16 1426           Personal Goal #1 Re-Evaluation   Personal Goal #1 0.5-2 lb wt loss per week to a goal  wt loss of 6-24 lb at graduation from Cardiac Rehab       Goal Progress Seen Yes       Comments Pt wt down 1.1 kg from admission.       Weight   Current Weight 169 lb 12.1 oz (77 kg)       Intervention Plan   Intervention Continue to educate, counsel and set short/long term goals regarding individualized specific personal dietary modifications.;Nutrition handout(s) given to patient.       Comments Low Fiber Nutrition therapy handout given for pt to follow when Crohn's flares up.           Psychosocial: Target Goals: Acknowledge presence or absence of depression, maximize coping skills, provide positive support system. Participant is able to verbalize types and ability to use techniques and skills needed for reducing stress and depression.  Initial Review & Psychosocial Screening:     Initial Psych Review & Screening - 03/12/16 1701    Initial Review   Current issues with Current Stress Concerns   Source of Stress Concerns Chronic Illness;Unable to perform yard/household activities   [place]? Yes   Barriers    Psychosocial barriers to participate in program The patient should benefit from training in stress management and relaxation.   Screening Interventions   Interventions Encouraged to exercise;Program counselor consult      Quality of Life Scores:     Quality of Life - 03/08/16 1355    Quality of Life Scores   Health/Function Pre 29.75 %   Socioeconomic Pre 29.83 %   Psych/Spiritual Pre 30 %   Family Pre 30 %   GLOBAL Pre 28.8 %      PHQ-9:     Recent Review Flowsheet Data    Depression screen [facility] 2/9 04/19/2016   Decreased Interest 0   Down, Depressed, Hopeless 0   PHQ - 2 Score 0      Psychosocial Evaluation and Intervention:   Psychosocial Re-Evaluation:     Psychosocial Re-Evaluation      04/13/16 1439           Psychosocial Re-Evaluation   Interventions Relaxation education;Stress management education;Encouraged to attend Cardiac Rehabilitation for the exercise       Continued Psychosocial Services Needed Yes          Vocational Rehabilitation: Provide vocational rehab assistance to qualifying candidates.   Vocational Rehab Evaluation & Intervention:     Vocational Rehab - 03/08/16 1439    Initial Vocational Rehab Evaluation & Intervention   Assessment shows need for Vocational Rehabilitation No  pt can return to her job without any difficulties      Education: Education Goals: Education classes will be provided on a weekly basis, covering required topics. Participant will state understanding/return demonstration of topics presented.  Learning Barriers/Preferences:     Learning Barriers/Preferences - 03/08/16 0926    Learning Barriers/Preferences   Learning Barriers Sight   Learning Preferences Pictoral;Skilled Demonstration;Verbal Instruction;Written Material;Video      Education Topics: Count Your Pulse:  -Group instruction provided by verbal instruction, demonstration, patient participation and written materials to support subject.   Instructors address importance of being able to find your pulse and how to count your pulse when at home without a heart monitor.  Patients get hands on experience counting their pulse with staff help and individually.      CARDIAC REHAB PHASE II EXERCISE from 04/11/2016 in [place]  Date  04/06/16   Instruction Review Code  2- meets goals/outcomes      Heart Attack, Angina, and Risk Factor Modification:  -Group instruction provided by verbal instruction, video, and written materials to support subject.  Instructors address signs and symptoms of angina and heart attacks.    Also discuss risk factors for heart disease and how to make changes to improve heart health risk factors.   Functional Fitness:  -Group instruction provided by verbal instruction, demonstration, patient participation, and written materials to support subject.  Instructors address safety measures for doing things around the house.  Discuss how to get up and down off the floor, how to pick things up properly, how to safely get out of a chair without assistance, and balance training.      CARDIAC REHAB PHASE II EXERCISE from 04/11/2016 in [place]   Date  03/30/16   Instruction Review Code  2- meets goals/outcomes      Meditation and Mindfulness:  -Group instruction provided by verbal instruction, patient participation, and written materials to support subject.  Instructor addresses importance of mindfulness and meditation practice to help reduce stress and improve awareness.  Instructor also leads participants through a meditation exercise.       CARDIAC REHAB PHASE II EXERCISE from 04/11/2016 in [place]   Date  04/04/16   Educator  [place]   Instruction Review Code  2- meets goals/outcomes      Stretching for Flexibility and Mobility:  -Group instruction provided by verbal instruction, patient  participation, and written materials to support subject.  Instructors lead participants through series of stretches that are designed to increase flexibility thus improving mobility.  These stretches are additional exercise for major muscle groups that are typically performed during regular warm up and cool down.      CARDIAC REHAB PHASE II EXERCISE from 04/11/2016 in [place]   Date  03/21/16   Educator  Luetta Nutting Iline Oven   Instruction Review Code  2- meets goals/outcomes      Hands Only CPR Anytime:  -Group instruction provided by verbal instruction, video, patient participation and written materials to support subject.  Instructors co-teach with AHA video for hands only CPR.  Participants get hands on experience with mannequins.      CARDIAC REHAB PHASE II EXERCISE from 04/11/2016 in [place]   Date  03/16/16   Instruction Review Code  2- meets goals/outcomes      Nutrition I class: Heart Healthy Eating:  -Group instruction provided by PowerPoint slides, verbal discussion, and written materials to support subject matter. The instructor gives an explanation and review of the Therapeutic Lifestyle Changes diet recommendations, which includes a discussion on lipid goals, dietary fat, sodium, fiber, plant stanol/sterol esters, sugar, and the components of a well-balanced, healthy diet.      CARDIAC REHAB PHASE II EXERCISE from 04/11/2016 in [place]   Date  04/11/16   Educator  RD   Instruction Review Code  Not applicable [class handouts given]      Nutrition II class: Lifestyle Skills:  -Group instruction provided by PowerPoint slides, verbal discussion, and written materials to support subject matter. The instructor gives an explanation and review of label reading, grocery shopping for heart health, heart healthy recipe modifications, and ways to make healthier choices when eating  out.      CARDIAC REHAB PHASE  II EXERCISE from 04/11/2016 in [place]   Date  04/11/16   Educator  RD   Instruction Review Code  Not applicable [class handouts given]      Diabetes Question & Answer:  -Group instruction provided by PowerPoint slides, verbal discussion, and written materials to support subject matter. The instructor gives an explanation and review of diabetes co-morbidities, pre- and post-prandial blood glucose goals, pre-exercise blood glucose goals, signs, symptoms, and treatment of hypoglycemia and hyperglycemia, and foot care basics.   Diabetes Blitz:  -Group instruction provided by PowerPoint slides, verbal discussion, and written materials to support subject matter. The instructor gives an explanation and review of the physiology behind type 1 and type 2 diabetes, diabetes medications and rational behind using different medications, pre- and post-prandial blood glucose recommendations and Hemoglobin A1c goals, diabetes diet, and exercise including blood glucose guidelines for exercising safely.    Portion Distortion:  -Group instruction provided by PowerPoint slides, verbal discussion, written materials, and food models to support subject matter. The instructor gives an explanation of serving size versus portion size, changes in portions sizes over the last 20 years, and what consists of a serving from each food group.      CARDIAC REHAB PHASE II EXERCISE from 04/11/2016 in [place]   Date  03/28/16   Educator  RD   Instruction Review Code  2- meets goals/outcomes      Stress Management:  -Group instruction provided by verbal instruction, video, and written materials to support subject matter.  Instructors review role of stress in heart disease and how to cope with stress positively.     Exercising on Your Own:  -Group instruction provided by verbal instruction, power point, and written materials to  support subject.  Instructors discuss benefits of exercise, components of exercise, frequency and intensity of exercise, and end points for exercise.  Also discuss use of nitroglycerin and activating EMS.  Review options of places to exercise outside of rehab.  Review guidelines for sex with heart disease.   Cardiac Drugs I:  -Group instruction provided by verbal instruction and written materials to support subject.  Instructor reviews cardiac drug classes: antiplatelets, anticoagulants, beta blockers, and statins.  Instructor discusses reasons, side effects, and lifestyle considerations for each drug class.      CARDIAC REHAB PHASE II EXERCISE from 04/11/2016 in [place]   Date  03/14/16   Educator  pharm D   Instruction Review Code  2- meets goals/outcomes      Cardiac Drugs II:  -Group instruction provided by verbal instruction and written materials to support subject.  Instructor reviews cardiac drug classes: angiotensin converting enzyme inhibitors (ACE-I), angiotensin II receptor blockers (ARBs), nitrates, and calcium channel blockers.  Instructor discusses reasons, side effects, and lifestyle considerations for each drug class.          CARDIAC REHAB PHASE II EXERCISE from 04/11/2016 in [place]   Date  04/11/16   Instruction Review Code  2- meets goals/outcomes      Anatomy and Physiology of the Circulatory System:  -Group instruction provided by verbal instruction, video, and written materials to support subject.  Reviews functional anatomy of heart, how it relates to various diagnoses, and what role the heart plays in the overall system.   Knowledge Questionnaire Score:     Knowledge Questionnaire Score - 03/08/16 1219    Knowledge Questionnaire Score   Pre  Score 19/24      Core Components/Risk Factors/Patient Goals at Admission:     Personal Goals and Risk Factors at Admission - 03/08/16 0926    Core  Components/Risk Factors/Patient Goals on Admission    Weight Management Yes;Weight Loss   Intervention Weight Management: Develop a combined nutrition and exercise program designed to reach desired caloric intake, while maintaining appropriate intake of nutrient and fiber, sodium and fats, and appropriate energy expenditure required for the weight goal.;Weight Management: Provide education and appropriate resources to help participant work on and attain dietary goals.;Weight Management/Obesity: Establish reasonable short term and long term weight goals.   Expected Outcomes Short Term: Continue to assess and modify interventions until short term weight is achieved;Weight Maintenance: Understanding of the daily nutrition guidelines, which includes 25-35% calories from fat, 7% or less cal from saturated fats, less than 269m cholesterol, less than 1.5gm of sodium, & 5 or more servings of fruits and vegetables daily;Weight Loss: Understanding of general recommendations for a balanced deficit meal plan, which promotes 1-2 lb weight loss per week and includes a negative energy balance of [phone] kcal/d;Understanding recommendations for meals to include 15-35% energy as protein, 25-35% energy from fat, 35-60% energy from carbohydrates, less than 2061mof dietary cholesterol, 20-35 gm of total fiber daily;Understanding of distribution of calorie intake throughout the day with the consumption of 4-5 meals/snacks   Increase Strength and Stamina Yes   Intervention Provide advice, education, support and counseling about physical activity/exercise needs.;Develop an individualized exercise prescription for aerobic and resistive training based on initial evaluation findings, risk stratification, comorbidities and participant's personal goals.   Expected Outcomes Achievement of increased cardiorespiratory fitness and enhanced flexibility, muscular endurance and strength shown through measurements of functional capacity and  personal statement of participant.   Improve shortness of breath with ADL's Yes   Personal Goal Other Yes   Personal Goal short: be heart healthy ( no cardiac events or hospitalizations) eat healthier   long: be able to walk with less SOB and increase energy. Lose weight goal 145-150lbs   Intervention provide nutrition education and exercise programming to assist with weightloss, increased energy and building exercise capacity. Also increased cardiac awareness and risks factors to decrease future cardiac events and hospitalizations   Expected Outcomes be able to walk with little to no SOB, lose weight and no future cardiac events      Core Components/Risk Factors/Patient Goals Review:      Goals and Risk Factor Review      04/09/16 1405 04/09/16 1609         Core Components/Risk Factors/Patient Goals Review   Personal Goals Review Improve shortness of breath with ADL's;Weight Management/Obesity       Review No more SOB with activities and weight is down 4lbs. Monitoring BP at home and complaint with HEP No more SOB with activities and weight is down 4lbs. Monitoring BP at home and complaint with HEP      Expected Outcomes Continue with HEP, weightloss and experience little to no SOB with activities.          Core Components/Risk Factors/Patient Goals at Discharge (Final Review):      Goals and Risk Factor Review - 04/09/16 1609    Core Components/Risk Factors/Patient Goals Review   Review No more SOB with activities and weight is down 4lbs. Monitoring BP at home and complaint with HEP      ITP Comments:     ITP Comments      03/08/16 [id number]  ITP Comments Dr. Fransico Him, medical director          Comments: Pt is making expected progress toward personal goals after completing 14 sessions. Recommend continued exercise and life style modification education including  stress management and relaxation techniques to decrease cardiac risk profile.

## 2016-04-13 NOTE — Telephone Encounter (Signed)
This is waiting on PA from insurance, has gone to appeals

## 2016-04-13 NOTE — Telephone Encounter (Signed)
Follow-up     The pt is returning the nurses call

## 2016-04-16 ENCOUNTER — Encounter (HOSPITAL_COMMUNITY)
Admission: RE | Admit: 2016-04-16 | Discharge: 2016-04-16 | Disposition: A | Discharge status: Home / Self Care | Payer: BLUE CROSS/BLUE SHIELD | Source: Ambulatory Visit | Attending: Cardiovascular Disease | Admitting: Cardiovascular Disease

## 2016-04-16 DIAGNOSIS — K5090 Crohn's disease, unspecified, without complications: Secondary | ICD-10-CM | POA: Diagnosis not present

## 2016-04-16 DIAGNOSIS — I214 Non-ST elevation (NSTEMI) myocardial infarction: Secondary | ICD-10-CM

## 2016-04-16 DIAGNOSIS — I119 Hypertensive heart disease without heart failure: Secondary | ICD-10-CM | POA: Diagnosis not present

## 2016-04-16 DIAGNOSIS — I252 Old myocardial infarction: Secondary | ICD-10-CM | POA: Diagnosis not present

## 2016-04-16 DIAGNOSIS — Z79899 Other long term (current) drug therapy: Secondary | ICD-10-CM | POA: Diagnosis not present

## 2016-04-18 ENCOUNTER — Encounter (HOSPITAL_COMMUNITY)
Admission: RE | Admit: 2016-04-18 | Discharge: 2016-04-18 | Disposition: A | Discharge status: Home / Self Care | Payer: BLUE CROSS/BLUE SHIELD | Source: Ambulatory Visit | Attending: Cardiovascular Disease | Admitting: Cardiovascular Disease

## 2016-04-18 DIAGNOSIS — I214 Non-ST elevation (NSTEMI) myocardial infarction: Secondary | ICD-10-CM

## 2016-04-18 DIAGNOSIS — I119 Hypertensive heart disease without heart failure: Secondary | ICD-10-CM | POA: Diagnosis not present

## 2016-04-18 DIAGNOSIS — K5090 Crohn's disease, unspecified, without complications: Secondary | ICD-10-CM | POA: Diagnosis not present

## 2016-04-18 DIAGNOSIS — Z79899 Other long term (current) drug therapy: Secondary | ICD-10-CM | POA: Diagnosis not present

## 2016-04-18 DIAGNOSIS — I252 Old myocardial infarction: Secondary | ICD-10-CM | POA: Diagnosis not present

## 2016-04-18 NOTE — Progress Notes (Signed)
Incomplete Session Note  Patient Details  Name: Melody Gardner MRN: [id number] Date of Birth: 15-Jun-1966 Referring Provider:        CARDIAC REHAB PHASE II EXERCISE from 03/14/2016 in [place]   Referring Provider  Skeet Latch MD      [name] did not complete her rehab session.  [name] is complaining of a sharp pain in her cheek she rate its a 6 out of a 10. Patient denies having any chest or arm pain. [name] says her pain started this morning. Upon palpation [name]'s left cheek is tender upon palpation, Blood pressure 122/80 then 108/80 recheck blood pressure. I call Bernerd Pho PA to notify. Telemetry rhythm Sinus rate 75. 12 lead ECG ordered and obtained. Bernerd Pho PA reviewed the 12 lead ECG and advised. 12 lead obtained and reviewed by Bernerd Pho PAC no new orders received. [name] advised that [name] follow up with Dr Nonda Lou office regarding her cheek pain. Dr Nonda Lou office called and notified. Appointment made for [name] to see Mackie Pai PA tomorrow at 10:15 AM. [name] did not exercise today. Will continue to monitor the patient throughout  the program.Maria Venetia Maxon, RN,BSN 04/18/2016 2:39 PM

## 2016-04-19 ENCOUNTER — Encounter: Payer: Self-pay | Admitting: Medical

## 2016-04-19 ENCOUNTER — Ambulatory Visit (INDEPENDENT_AMBULATORY_CARE_PROVIDER_SITE_OTHER): Payer: BLUE CROSS/BLUE SHIELD | Admitting: Medical

## 2016-04-19 VITALS — BP 154/95 | HR 74 | Temp 98.4°F | Ht 64.0 in | Wt 171.6 lb

## 2016-04-19 DIAGNOSIS — I10 Essential (primary) hypertension: Secondary | ICD-10-CM

## 2016-04-19 DIAGNOSIS — J0100 Acute maxillary sinusitis, unspecified: Secondary | ICD-10-CM

## 2016-04-19 DIAGNOSIS — J309 Allergic rhinitis, unspecified: Secondary | ICD-10-CM | POA: Diagnosis not present

## 2016-04-19 MED ORDER — BENZONATATE 100 MG PO CAPS: 100.0000 mg | ORAL_CAPSULE | Freq: Three times a day (TID) | ORAL | Status: DC | PRN

## 2016-04-19 MED ORDER — AMOXICILLIN-POT CLAVULANATE 875-125 MG PO TABS: 1.0000 | ORAL_TABLET | Freq: Two times a day (BID) | ORAL | Status: DC

## 2016-04-19 MED ORDER — FLUTICASONE PROPIONATE 50 MCG/ACT NA SUSP: 2.0000 | Freq: Every day | NASAL | Status: DC

## 2016-04-19 NOTE — Addendum Note (Signed)
Addended by: Anabel Halon on: 04/19/2016 11:45 AM   Modules accepted: Orders

## 2016-04-19 NOTE — Patient Instructions (Addendum)
Your appear to have a sinus infection ith preceding allergies. I am prescribing  augmentin antibiotic for the infection. To help with the nasal congestion I prescribed  flonase nasal steroid. For your associated cough, I prescribed benzonatate.  Your teeth on upper left side appear to have possible caries so recommend scheduling dental appointment.  For your htn and recent elevated bp today. Recommend increasing cozaar to 50 mg a day. Continue bystolic the same. Check daily write reading down and call us in one week with bp readings.  Rest, hydrate, tylenol for fever.  If you do get jaw pain or any other cardiac associated signs or symptoms then recommend UC or ED. Though presently this pain appears to be in maxillary sinus area.  Follow up in 7 days or as needed.

## 2016-04-19 NOTE — Progress Notes (Signed)
Pre visit review using our clinic tool,if applicable. No additional management support is needed unless otherwise documented below in the visit note.  

## 2016-04-19 NOTE — Progress Notes (Signed)
   Subjective:    Patient ID: Melody Gardner, female    DOB: Dec 06, 1965, 50 y.o.   MRN: [id number]  HPI    Pt in for some left side face pain(she explains pain was over maxilary sinus area). Pain started yesterday morning. Pt states pain was senstive to touch yesterday. Example like when washing face. Today the pain is not present. No pain on temp changes. Pt was at cardiac rehab yesterday. With this complaint they did ekg and was negative. Pt reports no chest pain or associated cardiac type symptoms today.   No tmj type pain. No ear pain presetnly.  Pt does feel little nasal congested last 3 days. Left sinus area feels little heavy.When she lays back left side sinus pressure increased. No teeth pain. Years since last saw dentist. Pt actually states last time felt like this may have had sinus infection. Pt points to maxillary sinus area. Pt has poor dentition left upper mouth. Some sneezing and some cough.Some runny nose as well.  Yesterday bp was 145/92. Elevated again today.    Review of Systems  Constitutional: Negative for fever, chills and fatigue.  HENT: Positive for congestion, postnasal drip, rhinorrhea, sinus pressure and sneezing. Negative for sore throat.   Respiratory: Positive for cough. Negative for chest tightness, shortness of breath and wheezing.   Cardiovascular: Negative for chest pain and palpitations.  Gastrointestinal: Negative for abdominal pain.  Neurological: Negative for dizziness, weakness, light-headedness and headaches.  Hematological: Negative for adenopathy. Does not bruise/bleed easily.  Psychiatric/Behavioral: Negative for behavioral problems and confusion.       Objective:   Physical Exam  General  Mental Status - Alert. General Appearance - Well groomed. Not in acute distress.  Skin Rashes- No Rashes.  HEENT Head- Normal. Ear Auditory Canal - Left- Normal. Right - Normal.Tympanic Membrane- Left- Normal. Right- Normal. Eye Sclera/Conjunctiva-  Left- Normal. Right- Normal. Nose & Sinuses Nasal Mucosa- Left-  Boggy and Congested. Right-  Boggy and  Congested.left  Maxillary pain on palpation but nofrontal sinus pressure. Mouth & Throat Lips: Upper Lip- Normal: no dryness, cracking, pallor, cyanosis, or vesicular eruption. Lower Lip-Normal: no dryness, cracking, pallor, cyanosis or vesicular eruption. Buccal Mucosa- Bilateral- No Aphthous ulcers. Oropharynx- No Discharge or Erythema. Tonsils: Characteristics- Bilateral- No Erythema or Congestion. Size/Enlargement- Bilateral- No enlargement. Discharge- bilateral-None.  Lt lower jaw-no pain on palpation. Lt tmj area no pain or crepitus on opening or closing mouth.  Neck Neck- Supple. No Masses.   Chest and Lung Exam Auscultation: Breath Sounds:-Clear even and unlabored.  Cardiovascular Auscultation:Rythm- Regular, rate and rhythm. Murmurs & Other Heart Sounds:Ausculatation of the heart reveal- No Murmurs.  Lymphatic Head & Neck General Head & Neck Lymphatics: Bilateral: Description- No Localized lymphadenopathy.       Assessment & Plan:  Your appear to have a sinus infection ith preceding allergies. I am prescribing  augmentin antibiotic for the infection. To help with the nasal congestion I prescribed  flonase nasal steroid. For your associated cough, I prescribed benzonatate.  Your teeth on upper left side appear to have possible caries so recommend scheduling dental appointment.  For your htn and recent elevated bp today. Recommend increasing cozaar to 50 mg a day. Continue bystolic the same.  Rest, hydrate, tylenol for fever.  Follow up in 7 days or as needed.

## 2016-04-19 NOTE — Progress Notes (Signed)
Pre visit review using our clinic review tool, if applicable. No additional management support is needed unless otherwise documented below in the visit note. 

## 2016-04-20 ENCOUNTER — Encounter: Payer: BLUE CROSS/BLUE SHIELD | Admitting: Family Medicine

## 2016-04-20 ENCOUNTER — Encounter (HOSPITAL_COMMUNITY)
Admission: RE | Admit: 2016-04-20 | Discharge: 2016-04-20 | Disposition: A | Discharge status: Home / Self Care | Payer: BLUE CROSS/BLUE SHIELD | Source: Ambulatory Visit | Attending: Cardiovascular Disease | Admitting: Cardiovascular Disease

## 2016-04-20 DIAGNOSIS — K5090 Crohn's disease, unspecified, without complications: Secondary | ICD-10-CM | POA: Diagnosis not present

## 2016-04-20 DIAGNOSIS — I252 Old myocardial infarction: Secondary | ICD-10-CM | POA: Diagnosis not present

## 2016-04-20 DIAGNOSIS — I119 Hypertensive heart disease without heart failure: Secondary | ICD-10-CM | POA: Diagnosis not present

## 2016-04-20 DIAGNOSIS — Z79899 Other long term (current) drug therapy: Secondary | ICD-10-CM | POA: Diagnosis not present

## 2016-04-20 DIAGNOSIS — I214 Non-ST elevation (NSTEMI) myocardial infarction: Secondary | ICD-10-CM

## 2016-04-20 NOTE — Telephone Encounter (Signed)
CT heart pending PA   Notes Recorded by Earvin Hansen on 04/13/2016 at 11:13 AM Advised patient ------  Notes Recorded by Earvin Hansen on 04/13/2016 at 9:59 AM Left message to call back ------  Notes Recorded by Skeet Latch, MD on 04/08/2016 at 5:00 PM No other arterial blockages.

## 2016-04-20 NOTE — Progress Notes (Signed)
Melody Gardner returned to exercise at cardiac rehab and exercised without difficulty. Melody Gardner reported that the pain in her left check has almost gone away.Will continue to monitor the patient throughout  the program. Barnet Pall, RN,BSN 04/20/2016 5:05 PM

## 2016-04-23 ENCOUNTER — Encounter (HOSPITAL_COMMUNITY)
Admission: RE | Admit: 2016-04-23 | Discharge: 2016-04-23 | Disposition: A | Discharge status: Home / Self Care | Payer: BLUE CROSS/BLUE SHIELD | Source: Ambulatory Visit | Attending: Cardiovascular Disease | Admitting: Cardiovascular Disease

## 2016-04-23 DIAGNOSIS — Z79899 Other long term (current) drug therapy: Secondary | ICD-10-CM | POA: Diagnosis not present

## 2016-04-23 DIAGNOSIS — K5090 Crohn's disease, unspecified, without complications: Secondary | ICD-10-CM | POA: Diagnosis not present

## 2016-04-23 DIAGNOSIS — I214 Non-ST elevation (NSTEMI) myocardial infarction: Secondary | ICD-10-CM

## 2016-04-23 DIAGNOSIS — I252 Old myocardial infarction: Secondary | ICD-10-CM | POA: Diagnosis not present

## 2016-04-23 DIAGNOSIS — I119 Hypertensive heart disease without heart failure: Secondary | ICD-10-CM | POA: Diagnosis not present

## 2016-04-25 ENCOUNTER — Encounter (HOSPITAL_COMMUNITY)
Admission: RE | Admit: 2016-04-25 | Discharge: 2016-04-25 | Disposition: A | Discharge status: Home / Self Care | Payer: BLUE CROSS/BLUE SHIELD | Source: Ambulatory Visit | Attending: Cardiovascular Disease | Admitting: Cardiovascular Disease

## 2016-04-25 DIAGNOSIS — I214 Non-ST elevation (NSTEMI) myocardial infarction: Secondary | ICD-10-CM

## 2016-04-25 DIAGNOSIS — Z79899 Other long term (current) drug therapy: Secondary | ICD-10-CM | POA: Diagnosis not present

## 2016-04-25 DIAGNOSIS — I252 Old myocardial infarction: Secondary | ICD-10-CM | POA: Diagnosis not present

## 2016-04-25 DIAGNOSIS — I119 Hypertensive heart disease without heart failure: Secondary | ICD-10-CM | POA: Diagnosis not present

## 2016-04-25 DIAGNOSIS — K5090 Crohn's disease, unspecified, without complications: Secondary | ICD-10-CM | POA: Diagnosis not present

## 2016-04-26 ENCOUNTER — Ambulatory Visit (INDEPENDENT_AMBULATORY_CARE_PROVIDER_SITE_OTHER): Payer: BLUE CROSS/BLUE SHIELD | Admitting: Cardiovascular Disease

## 2016-04-26 ENCOUNTER — Encounter: Payer: Self-pay | Admitting: Cardiovascular Disease

## 2016-04-26 VITALS — BP 123/96 | HR 78 | Ht 66.0 in | Wt 168.6 lb

## 2016-04-26 DIAGNOSIS — I6523 Occlusion and stenosis of bilateral carotid arteries: Secondary | ICD-10-CM

## 2016-04-26 DIAGNOSIS — I773 Arterial fibromuscular dysplasia: Secondary | ICD-10-CM

## 2016-04-26 DIAGNOSIS — Z5181 Encounter for therapeutic drug level monitoring: Secondary | ICD-10-CM | POA: Diagnosis not present

## 2016-04-26 DIAGNOSIS — I10 Essential (primary) hypertension: Secondary | ICD-10-CM

## 2016-04-26 LAB — BASIC METABOLIC PANEL WITH GFR
BUN: 10 mg/dL (ref 7–25)
CALCIUM: 9.4 mg/dL (ref 8.6–10.4)
CO2: 24 mmol/L (ref 20–31)
CREATININE: 0.94 mg/dL (ref 0.50–1.05)
Chloride: 107 mmol/L (ref 98–110)
GFR, Est African American: 82 mL/min (ref >=60)
GFR, Est Non African American: 71 mL/min (ref >=60)
GLUCOSE: 80 mg/dL (ref 65–99)
Potassium: 4.3 mmol/L (ref 3.5–5.3)
SODIUM: 141 mmol/L (ref 135–146)

## 2016-04-26 MED ORDER — NEBIVOLOL HCL 5 MG PO TABS: 5.0000 mg | ORAL_TABLET | Freq: Every day | ORAL | 3 refills | Status: DC

## 2016-04-26 NOTE — Progress Notes (Signed)
Cardiology Office Note   Date:  04/26/2016   ID:  Melody Gardner, DOB 09/20/1966, MRN [id number]  PCP:  Melody Held, DO  Cardiologist:   Skeet Latch, MD   Chief Complaint  Patient presents with  . Follow-up    1 month     History of Present Illness: Melody Gardner is a 50 y.o. 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  [name] was admitted 12/18/15 with chest pain.  Cardiac enzymes were elevated and she had mild lateral ST elevation.  In the cath she was found to have a spontaneous dissection with 100% occlusion of D1 that was medically managed. Her echo showed LVEF 60-65% with grade 1 diastolic dysfunction.  e [name] reports having her renal artery stented when she was around 27.   Given this history, there was concernfor fibromuscular dysplasia.  She was referred for carotid ultrasound that revealed bilateral intimal thickening with 1-39% stenosis bilaterally but no plaque.  [name] had a recurrent episode of chest pain on 02/06/16.  She was seen in the ED where cardiac enzymes were negative and EKG was not consistent with ischemia.  She was referred for a chest CT-A but it was not approved by the insurance company.  In the interim the ches tpain has resolved and she has participated in cardiac rehab without chest pain.  She denies shortness of breath, lower extremity edema, orthopnea or PND.  Since her last appointment [name] underwent CT angiography of her whole body.  She was found to have bilateral cervical ICA fibromuscular dysplasia.  There was no FMD of the renal arteries.  She was also noted to have a 12 mm soft tissue nodule in the right neck suspicious for an ectopic parathyroid adenoma and dental caries.  She has been doing well and denies any recurrent chest pain or shortness of breath.  She continues with cardiac  rehab.  Her blood pressure continues to be labile.  It averages in the 130s/80's, but it has been as high at 156/90.  She notes some lower extremity edema after standing or walking for long periods but denies orthopnea or PND.   Past Medical History:  Diagnosis Date  . Acute MI, lateral wall, initial episode of care ([place]) 12/18/2015  . Anemia 2013  . Crohn's disease ([place])   . Depression    pt states she is depressed but not medically diagnosed  . Hypertension 2012  . Hypertensive heart disease 12/19/2015    Past Surgical History:  Procedure Laterality Date  . CARDIAC CATHETERIZATION N/A 12/18/2015   Procedure: Left Heart Cath and Coronary Angiography;  Surgeon: Sherren Mocha, MD;  Location: [facility];  Service: Cardiovascular;  Laterality: N/A;  . ENDOMETRIAL ABLATION  2013  . LAPAROSCOPY FOR ECTOPIC PREGNANCY    . RENAL ARTERY STENT    . TUBAL LIGATION  1989     Current Outpatient Prescriptions  Medication Sig Dispense Refill  . amoxicillin-clavulanate (AUGMENTIN) 875-125 MG tablet Take 1 tablet by mouth 2 (two) times daily. 20 tablet 0  . aspirin EC 81 MG tablet Take 1 tablet (81 mg total) by mouth daily. 30 tablet 12  . azaTHIOprine (IMURAN) 50 MG tablet Take 50 mg by mouth 3 (three) times daily.    . balsalazide (COLAZAL) 750 MG capsule Take 2,250 mg by mouth 3 (three) times daily.    Marland Kitchen  benzonatate (TESSALON) 100 MG capsule Take 1 capsule (100 mg total) by mouth 3 (three) times daily as needed. 21 capsule 0  . fluticasone (FLONASE) 50 MCG/ACT nasal spray Place 2 sprays into both nostrils daily. 16 g 1  . losartan (COZAAR) 25 MG tablet Take 25 mg by mouth 2 (two) times daily.    . Multiple Vitamins-Minerals (WOMENS ONE DAILY PO) Take 1 tablet by mouth daily.    . nebivolol (BYSTOLIC) 2.5 MG tablet Take 1 tablet (2.5 mg total) by mouth daily. 30 tablet 3  . nitroGLYCERIN (NITROSTAT) 0.4 MG SL tablet Place 1 tablet (0.4 mg total) under the tongue every 5 (five) minutes as  needed for chest pain. 25 tablet 1  . traMADol (ULTRAM) 50 MG tablet Take 1 tablet (50 mg total) by mouth every 6 (six) hours as needed. 15 tablet 0   No current facility-administered medications for this visit.     Allergies:   Folic acid; Sulfa antibiotics; and Sulfasalazine    Social History:  The patient  reports that she has never smoked. She has never used smokeless tobacco. She reports that she does not drink alcohol or use drugs.   Family History:  The patient's family history includes Cancer in her brother; Diabetes in her brother and father; Hyperlipidemia in her father; Hypertension in her mother; Neuropathy in her brother and father; Stroke in her father.    ROS:  Please see the history of present illness.   Otherwise, review of systems are positive for none.   All other systems are reviewed and negative.    PHYSICAL EXAM: VS:  BP (!) 123/96   Pulse 78   Ht 5' 6"  (1.676 m)   Wt 168 lb 9.6 oz (76.5 kg)   LMP 04/02/2016   BMI 27.21 kg/m  , BMI Body mass index is 27.21 kg/m. GENERAL:  Well appearing HEENT:  Pupils equal round and reactive, fundi not visualized, oral mucosa unremarkable NECK:  No jugular venous distention, waveform within normal limits, carotid upstroke brisk and symmetric, no bruits, no thyromegaly LYMPHATICS:  No cervical adenopathy LUNGS:  Clear to auscultation bilaterally HEART:  RRR.  PMI not displaced or sustained,S1 and S2 within normal limits, no S3, no S4, no clicks, no rubs, no murmurs ABD:  Flat, positive bowel sounds normal in frequency in pitch, no bruits, no rebound, no guarding, no midline pulsatile mass, no hepatomegaly, no splenomegaly EXT:  2+ radial pulses bilaterally.  1+ L DP/TP. 2+ R DP/TP.  No edema, no cyanosis no clubbing SKIN:  No rashes no nodules NEURO:  Cranial nerves II through XII grossly intact, motor grossly intact throughout PSYCH:  Cognitively intact, oriented to person place and time   EKG:  EKG is ordered  today. 04/26/16: Sinus rhythm rate 79 bpm.  LVH  LHC 12/18/15: 100% D1 due to spontaneous dissection.  No other coronary artery disease.  Echo 12/19/15: Study Conclusions  - Left ventricle: The cavity size was normal. Wall thickness was  normal. Systolic function was normal. The estimated ejection  fraction was in the range of 60% to 65%. Mildly abnormal GLPSS at  -17%, more regional inferior strain. Wall motion was normal;  there were no regional wall motion abnormalities. Doppler  parameters are consistent with abnormal left ventricular  relaxation (grade 1 diastolic dysfunction). The E/e&' ratio is  between 8-15, suggesting indeterminate LV filling pressure. - Left atrium: The atrium was normal in size. - Tricuspid valve: There was no significant regurgitation. - Inferior vena  cava: The vessel was normal in size. The  respirophasic diameter changes were in the normal range (= 50%),  consistent with normal central venous pressure.  Impressions:  - LVEF 60-65%, normal wall thickness, normal wall motion, mildly  abnormal GLPSS at -17% (inferior), Grade 1 DD with indeterminate  LV filling pressure, normal chamber sizes, normal IVC.  Carotid Doppler 01/12/16: Intimal thickening bilaterally.  1-39% ICA stenosis bilaterally.    Recent Labs: 02/06/2016: Hemoglobin 12.6; Platelets 311 03/20/2016: BUN 9; Potassium 4.7; Sodium 140 04/05/2016: Creatinine, Ser 0.90    Lipid Panel    Component Value Date/Time   CHOL 120 12/19/2015 0221   TRIG 46 12/19/2015 0221   HDL 38 (L) 12/19/2015 0221   CHOLHDL 3.2 12/19/2015 0221   VLDL 9 12/19/2015 0221   LDLCALC 73 12/19/2015 0221      Wt Readings from Last 3 Encounters:  04/26/16 168 lb 9.6 oz (76.5 kg)  04/19/16 171 lb 9.6 oz (77.8 kg)  03/20/16 172 lb (78 kg)      ASSESSMENT AND PLAN:  # Spontaneous coronary artery dissection:  # Fibromuscular dysplasia: # ICA stenosis: Ms. Cliburn had SCAD of D1 and has bilateral ICA  stenosis.  She is currently asymptomatic from the ICA stenosis.  The stenosis likely is not amenable to surgical intervention. However, we will refer her to vascular surgery for consultation and management.   She is doing well clinically and no longer has chest pain.  Renal arteries were patent, though she reports previously placed renal artery stents.  Continue aspirin and nebivolol.  Given that she no longer has chest pain we will cancel the cardiac CT-A.    # Hypertension:  Blood pressure better remains above goal but remains above goal..  Increase Bystolic to 5 mg daily and losartan to .  Continue losartan 50 mg daily. We will check a BMP today.   Current medicines are reviewed at length with the patient today.  The patient does not have concerns regarding medicines.  The following changes have been made:  Increase nebivolol to 5 mg daily.  Labs/ tests ordered today include:   No orders of the defined types were placed in this encounter.    Disposition:   FU with Adalid Beckmann C. Oval Linsey, MD, [facility] in 3 months.     This note was written with the assistance of speech recognition software.  Please excuse any transcriptional errors.  Signed, Angelea Penny C. Oval Linsey, MD, [facility]  04/26/2016 10:21 AM    [place]

## 2016-04-26 NOTE — Patient Instructions (Signed)
Medications  Increase Bystolic to 5 mg daily    Lab work  Your physician recommends that you return for lab work.   Procedures  Cancel CT order    Follow-up in 3 months with Dr. Oval Linsey.  If you need a refill on your cardiac medications before your next appointment, please call your pharmacy.

## 2016-04-27 ENCOUNTER — Telehealth (HOSPITAL_COMMUNITY): Payer: Self-pay | Admitting: *Deleted

## 2016-04-27 ENCOUNTER — Encounter (HOSPITAL_COMMUNITY): Admission: RE | Admit: 2016-04-27 | Payer: BLUE CROSS/BLUE SHIELD | Source: Ambulatory Visit

## 2016-04-27 ENCOUNTER — Telehealth (HOSPITAL_COMMUNITY): Payer: Self-pay | Admitting: Cardiac Rehabilitation

## 2016-04-27 DIAGNOSIS — Z79899 Other long term (current) drug therapy: Secondary | ICD-10-CM | POA: Diagnosis not present

## 2016-04-27 NOTE — Telephone Encounter (Signed)
-----   Message from Skeet Latch, MD sent at 04/13/2016  5:31 PM EDT ----- Regarding: RE: cardiac rehab  Thank you.  ----- Message -----    From: Lowell Guitar, RN    Sent: 04/13/2016   3:57 PM      To: Skeet Latch, MD, Magda Kiel, RN Subject: cardiac rehab                                  Dear Dr. Oval Linsey,  [place] has consistent pattern of diastolic hypertension at cardiac rehab.  Diastolic usually in 82-42P, with highest overall BP today 170/100 pre- exercise after pt rushed to get here.  Recheck BP:  130/94 after rest. Rehab report has been faxed to your office for review.  Pt is asymptomatic.  Please advise.    Thank you, Andi Hence, RN, BSN Cardiac Pulmonary Rehab

## 2016-04-30 ENCOUNTER — Encounter (HOSPITAL_COMMUNITY)
Admission: RE | Admit: 2016-04-30 | Discharge: 2016-04-30 | Disposition: A | Discharge status: Home / Self Care | Payer: BLUE CROSS/BLUE SHIELD | Source: Ambulatory Visit | Attending: Cardiovascular Disease | Admitting: Cardiovascular Disease

## 2016-04-30 ENCOUNTER — Telehealth: Payer: Self-pay | Admitting: *Deleted

## 2016-04-30 ENCOUNTER — Encounter: Payer: Self-pay | Admitting: Cardiovascular Disease

## 2016-04-30 DIAGNOSIS — Z79899 Other long term (current) drug therapy: Secondary | ICD-10-CM | POA: Diagnosis not present

## 2016-04-30 DIAGNOSIS — I214 Non-ST elevation (NSTEMI) myocardial infarction: Secondary | ICD-10-CM

## 2016-04-30 DIAGNOSIS — K5090 Crohn's disease, unspecified, without complications: Secondary | ICD-10-CM | POA: Diagnosis not present

## 2016-04-30 DIAGNOSIS — I119 Hypertensive heart disease without heart failure: Secondary | ICD-10-CM | POA: Diagnosis not present

## 2016-04-30 DIAGNOSIS — I252 Old myocardial infarction: Secondary | ICD-10-CM | POA: Diagnosis not present

## 2016-04-30 MED ORDER — LOSARTAN POTASSIUM 25 MG PO TABS: 25.0000 mg | ORAL_TABLET | Freq: Two times a day (BID) | ORAL | 3 refills | Status: DC

## 2016-04-30 NOTE — Telephone Encounter (Signed)
Left message to call back  

## 2016-04-30 NOTE — Telephone Encounter (Signed)
-----   Message from Skeet Latch, MD sent at 04/27/2016  4:31 PM EDT ----- Normal kidney function no changes at this time.

## 2016-04-30 NOTE — Telephone Encounter (Signed)
F/u Message ° °Pt returning RN call. Please call back to discuss  °

## 2016-04-30 NOTE — Telephone Encounter (Signed)
Advised patient of lab results  

## 2016-04-30 NOTE — Telephone Encounter (Signed)
Follow Up:; ° ° °Returning your call. °

## 2016-05-02 ENCOUNTER — Encounter (HOSPITAL_COMMUNITY)
Admission: RE | Admit: 2016-05-02 | Discharge: 2016-05-02 | Disposition: A | Discharge status: Home / Self Care | Payer: BLUE CROSS/BLUE SHIELD | Source: Ambulatory Visit | Attending: Cardiovascular Disease | Admitting: Cardiovascular Disease

## 2016-05-02 DIAGNOSIS — I119 Hypertensive heart disease without heart failure: Secondary | ICD-10-CM | POA: Diagnosis not present

## 2016-05-02 DIAGNOSIS — I214 Non-ST elevation (NSTEMI) myocardial infarction: Secondary | ICD-10-CM

## 2016-05-02 DIAGNOSIS — K5090 Crohn's disease, unspecified, without complications: Secondary | ICD-10-CM | POA: Insufficient documentation

## 2016-05-02 DIAGNOSIS — Z7982 Long term (current) use of aspirin: Secondary | ICD-10-CM | POA: Insufficient documentation

## 2016-05-02 DIAGNOSIS — Z79899 Other long term (current) drug therapy: Secondary | ICD-10-CM | POA: Insufficient documentation

## 2016-05-02 DIAGNOSIS — I252 Old myocardial infarction: Secondary | ICD-10-CM | POA: Diagnosis not present

## 2016-05-04 ENCOUNTER — Encounter (HOSPITAL_COMMUNITY)
Admission: RE | Admit: 2016-05-04 | Discharge: 2016-05-04 | Disposition: A | Discharge status: Home / Self Care | Payer: BLUE CROSS/BLUE SHIELD | Source: Ambulatory Visit | Attending: Cardiovascular Disease | Admitting: Cardiovascular Disease

## 2016-05-04 DIAGNOSIS — K5090 Crohn's disease, unspecified, without complications: Secondary | ICD-10-CM | POA: Diagnosis not present

## 2016-05-04 DIAGNOSIS — I252 Old myocardial infarction: Secondary | ICD-10-CM | POA: Diagnosis not present

## 2016-05-04 DIAGNOSIS — I214 Non-ST elevation (NSTEMI) myocardial infarction: Secondary | ICD-10-CM

## 2016-05-04 DIAGNOSIS — I119 Hypertensive heart disease without heart failure: Secondary | ICD-10-CM | POA: Diagnosis not present

## 2016-05-04 DIAGNOSIS — Z79899 Other long term (current) drug therapy: Secondary | ICD-10-CM | POA: Diagnosis not present

## 2016-05-07 ENCOUNTER — Encounter (HOSPITAL_COMMUNITY)
Admission: RE | Admit: 2016-05-07 | Discharge: 2016-05-07 | Disposition: A | Discharge status: Home / Self Care | Payer: BLUE CROSS/BLUE SHIELD | Source: Ambulatory Visit | Attending: Cardiovascular Disease | Admitting: Cardiovascular Disease

## 2016-05-07 DIAGNOSIS — I252 Old myocardial infarction: Secondary | ICD-10-CM | POA: Diagnosis not present

## 2016-05-07 DIAGNOSIS — I214 Non-ST elevation (NSTEMI) myocardial infarction: Secondary | ICD-10-CM

## 2016-05-07 DIAGNOSIS — Z79899 Other long term (current) drug therapy: Secondary | ICD-10-CM | POA: Diagnosis not present

## 2016-05-07 DIAGNOSIS — K5090 Crohn's disease, unspecified, without complications: Secondary | ICD-10-CM | POA: Diagnosis not present

## 2016-05-07 DIAGNOSIS — I119 Hypertensive heart disease without heart failure: Secondary | ICD-10-CM | POA: Diagnosis not present

## 2016-05-09 ENCOUNTER — Encounter (HOSPITAL_COMMUNITY)
Admission: RE | Admit: 2016-05-09 | Discharge: 2016-05-09 | Disposition: A | Discharge status: Home / Self Care | Payer: BLUE CROSS/BLUE SHIELD | Source: Ambulatory Visit | Attending: Cardiovascular Disease | Admitting: Cardiovascular Disease

## 2016-05-09 DIAGNOSIS — I214 Non-ST elevation (NSTEMI) myocardial infarction: Secondary | ICD-10-CM

## 2016-05-09 DIAGNOSIS — I119 Hypertensive heart disease without heart failure: Secondary | ICD-10-CM | POA: Diagnosis not present

## 2016-05-09 DIAGNOSIS — K5090 Crohn's disease, unspecified, without complications: Secondary | ICD-10-CM | POA: Diagnosis not present

## 2016-05-09 DIAGNOSIS — Z79899 Other long term (current) drug therapy: Secondary | ICD-10-CM | POA: Diagnosis not present

## 2016-05-09 DIAGNOSIS — I252 Old myocardial infarction: Secondary | ICD-10-CM | POA: Diagnosis not present

## 2016-05-10 ENCOUNTER — Other Ambulatory Visit: Payer: Self-pay | Admitting: Vascular Surgery

## 2016-05-10 DIAGNOSIS — I6523 Occlusion and stenosis of bilateral carotid arteries: Secondary | ICD-10-CM

## 2016-05-11 ENCOUNTER — Encounter (HOSPITAL_COMMUNITY)
Admission: RE | Admit: 2016-05-11 | Discharge: 2016-05-11 | Disposition: A | Discharge status: Home / Self Care | Payer: BLUE CROSS/BLUE SHIELD | Source: Ambulatory Visit | Attending: Cardiovascular Disease | Admitting: Cardiovascular Disease

## 2016-05-11 DIAGNOSIS — I252 Old myocardial infarction: Secondary | ICD-10-CM | POA: Diagnosis not present

## 2016-05-11 DIAGNOSIS — I214 Non-ST elevation (NSTEMI) myocardial infarction: Secondary | ICD-10-CM

## 2016-05-11 DIAGNOSIS — Z79899 Other long term (current) drug therapy: Secondary | ICD-10-CM | POA: Diagnosis not present

## 2016-05-11 DIAGNOSIS — I119 Hypertensive heart disease without heart failure: Secondary | ICD-10-CM | POA: Diagnosis not present

## 2016-05-11 DIAGNOSIS — K5090 Crohn's disease, unspecified, without complications: Secondary | ICD-10-CM | POA: Diagnosis not present

## 2016-05-14 ENCOUNTER — Encounter (HOSPITAL_COMMUNITY)
Admission: RE | Admit: 2016-05-14 | Discharge: 2016-05-14 | Disposition: A | Discharge status: Home / Self Care | Payer: BLUE CROSS/BLUE SHIELD | Source: Ambulatory Visit | Attending: Cardiovascular Disease | Admitting: Cardiovascular Disease

## 2016-05-14 DIAGNOSIS — K5090 Crohn's disease, unspecified, without complications: Secondary | ICD-10-CM | POA: Diagnosis not present

## 2016-05-14 DIAGNOSIS — I119 Hypertensive heart disease without heart failure: Secondary | ICD-10-CM | POA: Diagnosis not present

## 2016-05-14 DIAGNOSIS — I252 Old myocardial infarction: Secondary | ICD-10-CM | POA: Diagnosis not present

## 2016-05-14 DIAGNOSIS — Z79899 Other long term (current) drug therapy: Secondary | ICD-10-CM | POA: Diagnosis not present

## 2016-05-14 DIAGNOSIS — I214 Non-ST elevation (NSTEMI) myocardial infarction: Secondary | ICD-10-CM

## 2016-05-16 ENCOUNTER — Encounter (HOSPITAL_COMMUNITY)
Admission: RE | Admit: 2016-05-16 | Discharge: 2016-05-16 | Disposition: A | Discharge status: Home / Self Care | Payer: BLUE CROSS/BLUE SHIELD | Source: Ambulatory Visit | Attending: Cardiovascular Disease | Admitting: Cardiovascular Disease

## 2016-05-16 DIAGNOSIS — Z79899 Other long term (current) drug therapy: Secondary | ICD-10-CM | POA: Diagnosis not present

## 2016-05-16 DIAGNOSIS — K5090 Crohn's disease, unspecified, without complications: Secondary | ICD-10-CM | POA: Diagnosis not present

## 2016-05-16 DIAGNOSIS — I214 Non-ST elevation (NSTEMI) myocardial infarction: Secondary | ICD-10-CM

## 2016-05-16 DIAGNOSIS — R7989 Other specified abnormal findings of blood chemistry: Secondary | ICD-10-CM | POA: Diagnosis not present

## 2016-05-16 DIAGNOSIS — I252 Old myocardial infarction: Secondary | ICD-10-CM | POA: Diagnosis not present

## 2016-05-16 DIAGNOSIS — I119 Hypertensive heart disease without heart failure: Secondary | ICD-10-CM | POA: Diagnosis not present

## 2016-05-16 DIAGNOSIS — K5080 Crohn's disease of both small and large intestine without complications: Secondary | ICD-10-CM | POA: Diagnosis not present

## 2016-05-16 DIAGNOSIS — R945 Abnormal results of liver function studies: Secondary | ICD-10-CM | POA: Insufficient documentation

## 2016-05-17 ENCOUNTER — Telehealth: Payer: Self-pay

## 2016-05-17 ENCOUNTER — Ambulatory Visit (INDEPENDENT_AMBULATORY_CARE_PROVIDER_SITE_OTHER): Payer: BLUE CROSS/BLUE SHIELD | Admitting: Endocrinology

## 2016-05-17 ENCOUNTER — Encounter: Payer: Self-pay | Admitting: Endocrinology

## 2016-05-17 DIAGNOSIS — R221 Localized swelling, mass and lump, neck: Secondary | ICD-10-CM | POA: Diagnosis not present

## 2016-05-17 LAB — TSH: TSH: 1.46 u[IU]/mL (ref 0.35–4.50)

## 2016-05-17 NOTE — Progress Notes (Signed)
Subjective:    Patient ID: Melody Gardner, female    DOB: 07/08/1966, 50 y.o.   MRN: [id number]  HPI In mid-2017, on a CT , pt was incidentally noted to have a slight nodule at the anterior neck.  No assoc pain.  she is unaware of ever having had thyroid or parathyroid problems in the past.  He has no h/o XRT or surgery to the neck.  She is a non-smoker.   Past Medical History:  Diagnosis Date  . Acute MI, lateral wall, initial episode of care ([place]) 12/18/2015  . Anemia 2013  . Crohn's disease ([place])   . Depression    pt states she is depressed but not medically diagnosed  . Hypertension 2012  . Hypertensive heart disease 12/19/2015    Past Surgical History:  Procedure Laterality Date  . CARDIAC CATHETERIZATION N/A 12/18/2015   Procedure: Left Heart Cath and Coronary Angiography;  Surgeon: Sherren Mocha, MD;  Location: [facility];  Service: Cardiovascular;  Laterality: N/A;  . ENDOMETRIAL ABLATION  2013  . LAPAROSCOPY FOR ECTOPIC PREGNANCY    . RENAL ARTERY STENT    . TUBAL LIGATION  1989    Social History   Social History  . Marital status: Married    Spouse name: N/A  . Number of children: N/A  . Years of education: N/A   Occupational History  . Not on file.   Social History Main Topics  . Smoking status: Never Smoker  . Smokeless tobacco: Never Used  . Alcohol use No  . Drug use: No  . Sexual activity: Yes    Birth control/ protection: Surgical   Other Topics Concern  . Not on file   Social History Narrative  . No narrative on file    Current Outpatient Prescriptions on File Prior to Visit  Medication Sig Dispense Refill  . aspirin EC 81 MG tablet Take 1 tablet (81 mg total) by mouth daily. 30 tablet 12  . azaTHIOprine (IMURAN) 50 MG tablet Take 50 mg by mouth 3 (three) times daily.    . balsalazide (COLAZAL) 750 MG capsule Take 2,250 mg by mouth 3 (three) times daily.    . fluticasone (FLONASE) 50 MCG/ACT nasal spray Place 2 sprays into both nostrils  daily. 16 g 1  . losartan (COZAAR) 25 MG tablet Take 1 tablet (25 mg total) by mouth 2 (two) times daily. 180 tablet 3  . Multiple Vitamins-Minerals (WOMENS ONE DAILY PO) Take 1 tablet by mouth daily.    . nebivolol (BYSTOLIC) 5 MG tablet Take 1 tablet (5 mg total) by mouth daily. 90 tablet 3  . nitroGLYCERIN (NITROSTAT) 0.4 MG SL tablet Place 1 tablet (0.4 mg total) under the tongue every 5 (five) minutes as needed for chest pain. 25 tablet 1   No current facility-administered medications on file prior to visit.     Allergies  Allergen Reactions  . Folic Acid Rash    Pt declines this allergy  . Sulfa Antibiotics Rash  . Sulfasalazine Itching and Rash    Family History  Problem Relation Age of Onset  . Hypertension Mother   . Cancer Brother   . Diabetes Brother   . Diabetes Father   . Stroke Father   . Neuropathy Father   . Hyperlipidemia Father   . Neuropathy Brother   . Thyroid disease Neg Hx     BP 116/78   Pulse 92   Ht 5' 6"  (1.676 m)   Wt 170 lb (  77.1 kg)   SpO2 99%   BMI 27.44 kg/m   Review of Systems Denies weight change, hoarseness, visual loss, chest pain, sob, dysphagia, skin rash, depression, headache, and numbness.  She has easy bruising, rhinorrhea, and cold intolerance.      Objective:   Physical Exam VS: see vs page GEN: no distress HEAD: head: no deformity eyes: no periorbital swelling, no proptosis external nose and ears are normal mouth: no lesion seen NECK: supple, thyroid is not enlarged CHEST WALL: no deformity LUNGS: clear to auscultation CV: reg rate and rhythm, no murmur ABD: abdomen is soft, nontender.  no hepatosplenomegaly.  not distended.  no hernia MUSCULOSKELETAL: muscle bulk and strength are grossly normal.  no obvious joint swelling.  gait is normal and steady EXTEMITIES: no deformity.  no ulcer on the feet.  feet are of normal color and temp.  no edema PULSES: dorsalis pedis intact bilat.  no carotid bruit NEURO:  cn 2-12  grossly intact.   readily moves all 4's.  sensation is intact to touch on the feet SKIN:  Normal texture and temperature.  No rash or suspicious lesion is visible.   NODES:  None palpable at the neck PSYCH: alert, well-oriented.  Does not appear anxious nor depressed.    [place]: Nonspecific 12 mm oval soft tissue nodule in the right neck at the level IIa nodal station with early enhancement. 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   Lab Results  Component Value Date   TSH 1.46 05/17/2016   Lab Results  Component Value Date   PTH 51 05/17/2016   CALCIUM 9.7 05/17/2016   I have reviewed outside records, and summarized:  Pt was noted to have nodule, and referred here.  She was noted to have no h/o thyroid or parathyroid probs.       Assessment & Plan:  Neck nodule, new, uncertain etiology.

## 2016-05-17 NOTE — Telephone Encounter (Signed)
Called and left message for normal results, gave call back number if any questions.

## 2016-05-17 NOTE — Patient Instructions (Addendum)
blood tests are requested for you today.  We'll let you know about the results. Let's check an ultrasound.  you will receive a phone call, about a day and time for an appointment.  These results will tell us if there is anything else we need to do.

## 2016-05-18 ENCOUNTER — Encounter (HOSPITAL_COMMUNITY)
Admission: RE | Admit: 2016-05-18 | Discharge: 2016-05-18 | Disposition: A | Discharge status: Home / Self Care | Payer: BLUE CROSS/BLUE SHIELD | Source: Ambulatory Visit | Attending: Cardiovascular Disease | Admitting: Cardiovascular Disease

## 2016-05-18 DIAGNOSIS — I252 Old myocardial infarction: Secondary | ICD-10-CM | POA: Diagnosis not present

## 2016-05-18 DIAGNOSIS — Z79899 Other long term (current) drug therapy: Secondary | ICD-10-CM | POA: Diagnosis not present

## 2016-05-18 DIAGNOSIS — I214 Non-ST elevation (NSTEMI) myocardial infarction: Secondary | ICD-10-CM

## 2016-05-18 DIAGNOSIS — I119 Hypertensive heart disease without heart failure: Secondary | ICD-10-CM | POA: Diagnosis not present

## 2016-05-18 DIAGNOSIS — K5090 Crohn's disease, unspecified, without complications: Secondary | ICD-10-CM | POA: Diagnosis not present

## 2016-05-18 LAB — PTH, INTACT AND CALCIUM
Calcium: 9.7 mg/dL (ref 8.4–10.5)
PTH: 51 pg/mL (ref 14–64)

## 2016-05-18 NOTE — Progress Notes (Signed)
Cardiac Individual Treatment Plan  Patient Details  Name: Melody Gardner MRN: [id number] Date of Birth: November 23, 1965 Referring Provider:   Flowsheet Row CARDIAC REHAB PHASE II EXERCISE from 03/14/2016 in [place]  Referring Provider  Skeet Latch MD      Initial Encounter Date:  [place] PHASE II EXERCISE from 03/14/2016 in [place]  Date  03/08/16  Referring Provider  Skeet Latch MD      Visit Diagnosis: No diagnosis found.  Patient's Home Medications on Admission:  Current Outpatient Prescriptions:  .  aspirin EC 81 MG tablet, Take 1 tablet (81 mg total) by mouth daily., Disp: 30 tablet, Rfl: 12 .  azaTHIOprine (IMURAN) 50 MG tablet, Take 50 mg by mouth 3 (three) times daily., Disp: , Rfl:  .  balsalazide (COLAZAL) 750 MG capsule, Take 2,250 mg by mouth 3 (three) times daily., Disp: , Rfl:  .  fluticasone (FLONASE) 50 MCG/ACT nasal spray, Place 2 sprays into both nostrils daily., Disp: 16 g, Rfl: 1 .  losartan (COZAAR) 25 MG tablet, Take 1 tablet (25 mg total) by mouth 2 (two) times daily., Disp: 180 tablet, Rfl: 3 .  Multiple Vitamins-Minerals (WOMENS ONE DAILY PO), Take 1 tablet by mouth daily., Disp: , Rfl:  .  nebivolol (BYSTOLIC) 5 MG tablet, Take 1 tablet (5 mg total) by mouth daily., Disp: 90 tablet, Rfl: 3 .  nitroGLYCERIN (NITROSTAT) 0.4 MG SL tablet, Place 1 tablet (0.4 mg total) under the tongue every 5 (five) minutes as needed for chest pain., Disp: 25 tablet, Rfl: 1 .  traMADol (ULTRAM) 50 MG tablet, Take by mouth every 6 (six) hours as needed., Disp: , Rfl:   Past Medical History: Past Medical History:  Diagnosis Date  . Acute MI, lateral wall, initial episode of care ([place]) 12/18/2015  . Anemia 2013  . Crohn's disease ([place])   . Depression    pt states she is depressed but not medically diagnosed  . Hypertension 2012  . Hypertensive heart disease 12/19/2015    Tobacco  Use: History  Smoking Status  . Never Smoker  Smokeless Tobacco  . Never Used    Labs: Recent Review Flowsheet Data    Labs for ITP Cardiac and Pulmonary Rehab Latest Ref Rng & Units 01/07/2015 12/19/2015   Cholestrol 0 - 200 mg/dL 131 120   LDLCALC 0 - 99 mg/dL 72 73   HDL >40 mg/dL 48 38(L)   Trlycerides <150 mg/dL 56 46      Capillary Blood Glucose: No results found for: GLUCAP   Exercise Target Goals:    Exercise Program Goal: Individual exercise prescription set with THRR, safety & activity barriers. Participant demonstrates ability to understand and report RPE using BORG scale, to self-measure pulse accurately, and to acknowledge the importance of the exercise prescription.  Exercise Prescription Goal: Starting with aerobic activity 30 plus minutes a day, 3 days per week for initial exercise prescription. Provide home exercise prescription and guidelines that participant acknowledges understanding prior to discharge.  Activity Barriers & Risk Stratification:     Activity Barriers & Cardiac Risk Stratification - 03/08/16 0925      Activity Barriers & Cardiac Risk Stratification   Activity Barriers Muscular Weakness;Other (comment)   Comments minor aches and pain   Cardiac Risk Stratification High      6 Minute Walk:     6 Minute Walk    Row Name 03/08/16 1009 03/08/16 1220  6 Minute Walk   Phase Initial  -    Distance 1482 feet  -    Walk Time 6 minutes  -    # of Rest Breaks 0  -    MPH  - 2.8    METS  - 4.1    RPE  - 12    VO2 Peak  - 14.4    Symptoms  - No    Resting HR 771 bpm  -    Resting BP 130/86  -    Max Ex. HR 93 bpm  -    Max Ex. BP 136/86  -    2 Minute Post BP 139/86  -       Initial Exercise Prescription:     Initial Exercise Prescription - 03/14/16 1400      Date of Initial Exercise RX and Referring Provider   Date 03/08/16   Referring Provider Skeet Latch MD     Treadmill   MPH 2   Grade 1   Minutes 10    METs 2.81     Bike   Level 0.8   Minutes 10   METs 2     NuStep   Level 3   Minutes 10   METs 2     Prescription Details   Frequency (times per week) 3     Intensity   THRR 40-80% of Max Heartrate 68-136   Ratings of Perceived Exertion 11-13     Progression   Progression Continue to progress workloads to maintain intensity without signs/symptoms of physical distress.     Resistance Training   Training Prescription Yes   Weight 2   Reps 10-12      Perform Capillary Blood Glucose checks as needed.  Exercise Prescription Changes:     Exercise Prescription Changes    Row Name 04/02/16 1200 04/09/16 1600 04/27/16 1600 05/09/16 1600       Exercise Review   Progression  - Yes Yes Yes      Response to Exercise   Blood Pressure (Admit) 104/60 106/70 106/70 134/90    Blood Pressure (Exercise) 127/81 138/79 138/79 140/80    Blood Pressure (Exit) 135/85 122/82 122/82 126/90    Heart Rate (Admit) 71 bpm 69 bpm 69 bpm 74 bpm    Heart Rate (Exercise) 117 bpm 105 bpm 105 bpm 112 bpm    Heart Rate (Exit) 78 bpm 69 bpm 69 bpm 66 bpm    Rating of Perceived Exertion (Exercise)  -  - 10 13    Duration Progress to 30 minutes of continuous aerobic without signs/symptoms of physical distress Progress to 30 minutes of continuous aerobic without signs/symptoms of physical distress Progress to 30 minutes of continuous aerobic without signs/symptoms of physical distress Progress to 30 minutes of continuous aerobic without signs/symptoms of physical distress    Intensity THRR unchanged THRR unchanged THRR unchanged THRR unchanged      Progression   Progression Continue to progress workloads to maintain intensity without signs/symptoms of physical distress. Continue to progress workloads to maintain intensity without signs/symptoms of physical distress. Continue to progress workloads to maintain intensity without signs/symptoms of physical distress. Continue to progress workloads to maintain  intensity without signs/symptoms of physical distress.    Average METs 2.6 3.2 3.2 3.5      Resistance Training   Training Prescription Yes Yes Yes Yes    Weight 5lbs 5lbs 5lbs 5lbs    Reps 10-12 10-12 10-12 10-12  Treadmill   MPH 2.5 2.5 2.6 2.6    Grade _0 Minutes _1 METs 3.26 3.26 3.71 3.71      Bike   Level 0.8 0.8 0.8 0.8    Minutes _2 METs 2.92 2.92 2.94 2.94      NuStep   Level _3 Minutes _4 METs 2.1 3.1 3.2 3.3      Home Exercise Plan   Plans to continue exercise at (P)  Home  Reviewed HEP on 6/23. Pt plans to walk at home for exercise Home  Reviewed HEP on 6/23. Pt plans to walk at home for exercise Home  Reviewed HEP on 6/23. Pt plans to walk at home for exercise Home  Reviewed HEP on 6/23. Pt plans to walk at home for exercise    Frequency (P)  Add 2 additional days to program exercise sessions. Add 2 additional days to program exercise sessions. Add 2 additional days to program exercise sessions. Add 2 additional days to program exercise sessions.       Exercise Comments:     Exercise Comments    Row Name 04/09/16 1609 05/11/16 1452         Exercise Comments Reviewed METS and goals. Pt is tolerating exercise well; will continue to monitor exercise progression Reviewed METS and goals. Pt is tolerating exercise well; will continue to monitor exercise progression         Discharge Exercise Prescription (Final Exercise Prescription Changes):     Exercise Prescription Changes - 05/09/16 1600      Exercise Review   Progression Yes     Response to Exercise   Blood Pressure (Admit) 134/90   Blood Pressure (Exercise) 140/80   Blood Pressure (Exit) 126/90   Heart Rate (Admit) 74 bpm   Heart Rate (Exercise) 112 bpm   Heart Rate (Exit) 66 bpm   Rating of Perceived Exertion (Exercise) 13   Duration Progress to 30 minutes of continuous aerobic without signs/symptoms of physical distress   Intensity  THRR unchanged     Progression   Progression Continue to progress workloads to maintain intensity without signs/symptoms of physical distress.   Average METs 3.5     Resistance Training   Training Prescription Yes   Weight 5lbs   Reps 10-12     Treadmill   MPH 2.6   Grade 2   Minutes 10   METs 3.71     Bike   Level 0.8   Minutes 10   METs 2.94     NuStep   Level 4   Minutes 10   METs 3.3     Home Exercise Plan   Plans to continue exercise at Home  Reviewed HEP on 6/23. Pt plans to walk at home for exercise   Frequency Add 2 additional days to program exercise sessions.      Nutrition:  Target Goals: Understanding of nutrition guidelines, daily intake of sodium <1556m, cholesterol <2084m calories 30% from fat and 7% or less from saturated fats, daily to have 5 or more servings of fruits and vegetables.  Biometrics:     Pre Biometrics - 03/08/16 1224      Pre Biometrics   % Body Fat 37.5 %       Nutrition Therapy Plan and Nutrition Goals:     Nutrition Therapy & Goals -  03/12/16 0925      Nutrition Therapy   Diet Therapeutic Lifestyle Changes     Personal Nutrition Goals   Personal Goal #1 0.5-2 lb wt loss per week to a goal wt loss of 6-24 lb at graduation from [place], educate and counsel regarding individualized specific dietary modifications aiming towards targeted core components such as weight, hypertension, lipid management, diabetes, heart failure and other comorbidities.   Expected Outcomes Short Term Goal: Understand basic principles of dietary content, such as calories, fat, sodium, cholesterol and nutrients.;Long Term Goal: Adherence to prescribed nutrition plan.      Nutrition Discharge: Nutrition Scores:     Nutrition Assessments - 03/12/16 0925      MEDFICTS Scores   Pre Score 121      Nutrition Goals Re-Evaluation:     Nutrition Goals Re-Evaluation    Row Name 04/11/16  1426             Personal Goal #1 Re-Evaluation   Personal Goal #1 0.5-2 lb wt loss per week to a goal wt loss of 6-24 lb at graduation from Cardiac Rehab       Goal Progress Seen Yes       Comments Pt wt down 1.1 kg from admission.         Weight   Current Weight 169 lb 12.1 oz (77 kg)         Intervention Plan   Intervention Continue to educate, counsel and set short/long term goals regarding individualized specific personal dietary modifications.;Nutrition handout(s) given to patient.       Comments Low Fiber Nutrition therapy handout given for pt to follow when Crohn's flares up.           Psychosocial: Target Goals: Acknowledge presence or absence of depression, maximize coping skills, provide positive support system. Participant is able to verbalize types and ability to use techniques and skills needed for reducing stress and depression.  Initial Review & Psychosocial Screening:     Initial Psych Review & Screening - 03/12/16 1701      Initial Review   Current issues with Current Stress Concerns   Source of Stress Concerns Chronic Illness;Unable to perform yard/household activities     [place]? Yes     Barriers   Psychosocial barriers to participate in program The patient should benefit from training in stress management and relaxation.     Screening Interventions   Interventions Encouraged to exercise;Program counselor consult      Quality of Life Scores:     Quality of Life - 03/08/16 1355      Quality of Life Scores   Health/Function Pre 29.75 %   Socioeconomic Pre 29.83 %   Psych/Spiritual Pre 30 %   Family Pre 30 %   GLOBAL Pre 28.8 %      PHQ-9: Recent Review Flowsheet Data    Depression screen [facility] 2/9 04/19/2016   Decreased Interest 0   Down, Depressed, Hopeless 0   PHQ - 2 Score 0      Psychosocial Evaluation and Intervention:     Psychosocial Evaluation - 05/18/16 0812      Psychosocial Evaluation &  Interventions   Interventions Stress management education;Relaxation education;Encouraged to exercise with the program and follow exercise prescription   Comments pt has health related anxiety which is decreasing with cardiac rehab activities. pt encouraged to continue participation in stress management and  relaxation educaiton classes.    Continued Psychosocial Services Needed Yes      Psychosocial Re-Evaluation:     Psychosocial Re-Evaluation    [place] Name 04/13/16 1439             Psychosocial Re-Evaluation   Interventions Relaxation education;Stress management education;Encouraged to attend Cardiac Rehabilitation for the exercise       Continued Psychosocial Services Needed Yes          Vocational Rehabilitation: Provide vocational rehab assistance to qualifying candidates.   Vocational Rehab Evaluation & Intervention:     Vocational Rehab - 03/08/16 1439      Initial Vocational Rehab Evaluation & Intervention   Assessment shows need for Vocational Rehabilitation No  pt can return to her job without any difficulties      Education: Education Goals: Education classes will be provided on a weekly basis, covering required topics. Participant will state understanding/return demonstration of topics presented.  Learning Barriers/Preferences:     Learning Barriers/Preferences - 03/08/16 0926      Learning Barriers/Preferences   Learning Barriers Sight   Learning Preferences Pictoral;Skilled Demonstration;Verbal Instruction;Written Material;Video      Education Topics: Count Your Pulse:  -Group instruction provided by verbal instruction, demonstration, patient participation and written materials to support subject.  Instructors address importance of being able to find your pulse and how to count your pulse when at home without a heart monitor.  Patients get hands on experience counting their pulse with staff help and individually. Flowsheet Row CARDIAC REHAB PHASE II  EXERCISE from 05/16/2016 in [place]  Date  04/06/16  Instruction Review Code  2- meets goals/outcomes      Heart Attack, Angina, and Risk Factor Modification:  -Group instruction provided by verbal instruction, video, and written materials to support subject.  Instructors address signs and symptoms of angina and heart attacks.    Also discuss risk factors for heart disease and how to make changes to improve heart health risk factors.   Functional Fitness:  -Group instruction provided by verbal instruction, demonstration, patient participation, and written materials to support subject.  Instructors address safety measures for doing things around the house.  Discuss how to get up and down off the floor, how to pick things up properly, how to safely get out of a chair without assistance, and balance training. Flowsheet Row CARDIAC REHAB PHASE II EXERCISE from 05/16/2016 in [place]  Date  04/20/16  Educator  Seward Carol, MS,ACSM CEP  Instruction Review Code  R- Review/reinforce      Meditation and Mindfulness:  -Group instruction provided by verbal instruction, patient participation, and written materials to support subject.  Instructor addresses importance of mindfulness and meditation practice to help reduce stress and improve awareness.  Instructor also leads participants through a meditation exercise.  Flowsheet Row CARDIAC REHAB PHASE II EXERCISE from 05/16/2016 in [place]  Date  04/04/16  Educator  [place]  Instruction Review Code  2- meets goals/outcomes      Stretching for Flexibility and Mobility:  -Group instruction provided by verbal instruction, patient participation, and written materials to support subject.  Instructors lead participants through series of stretches that are designed to increase flexibility thus improving mobility.  These stretches are  additional exercise for major muscle groups that are typically performed during regular warm up and cool down. Flowsheet Row CARDIAC REHAB PHASE II EXERCISE from 05/16/2016 in [place]  [place]  Date  03/21/16  Educator  Luetta Nutting Iline Oven  Instruction Review Code  2- meets goals/outcomes      Hands Only CPR Anytime:  -Group instruction provided by verbal instruction, video, patient participation and written materials to support subject.  Instructors co-teach with AHA video for hands only CPR.  Participants get hands on experience with mannequins. Flowsheet Row CARDIAC REHAB PHASE II EXERCISE from 05/16/2016 in [place]  Date  03/16/16  Instruction Review Code  2- meets goals/outcomes      Nutrition I class: Heart Healthy Eating:  -Group instruction provided by PowerPoint slides, verbal discussion, and written materials to support subject matter. The instructor gives an explanation and review of the Therapeutic Lifestyle Changes diet recommendations, which includes a discussion on lipid goals, dietary fat, sodium, fiber, plant stanol/sterol esters, sugar, and the components of a well-balanced, healthy diet. Flowsheet Row CARDIAC REHAB PHASE II EXERCISE from 05/16/2016 in [place]  Date  04/11/16  Educator  RD  Instruction Review Code  Not applicable [class handouts given]      Nutrition II class: Lifestyle Skills:  -Group instruction provided by PowerPoint slides, verbal discussion, and written materials to support subject matter. The instructor gives an explanation and review of label reading, grocery shopping for heart health, heart healthy recipe modifications, and ways to make healthier choices when eating out. Flowsheet Row CARDIAC REHAB PHASE II EXERCISE from 05/16/2016 in [place]  Date  04/11/16  Educator  RD  Instruction Review Code  Not applicable  [class handouts given]      Diabetes Question & Answer:  -Group instruction provided by PowerPoint slides, verbal discussion, and written materials to support subject matter. The instructor gives an explanation and review of diabetes co-morbidities, pre- and post-prandial blood glucose goals, pre-exercise blood glucose goals, signs, symptoms, and treatment of hypoglycemia and hyperglycemia, and foot care basics. Flowsheet Row CARDIAC REHAB PHASE II EXERCISE from 05/16/2016 in [place]  Date  05/11/16  Educator  RD  Instruction Review Code  2- meets goals/outcomes      Diabetes Blitz:  -Group instruction provided by PowerPoint slides, verbal discussion, and written materials to support subject matter. The instructor gives an explanation and review of the physiology behind type 1 and type 2 diabetes, diabetes medications and rational behind using different medications, pre- and post-prandial blood glucose recommendations and Hemoglobin A1c goals, diabetes diet, and exercise including blood glucose guidelines for exercising safely.    Portion Distortion:  -Group instruction provided by PowerPoint slides, verbal discussion, written materials, and food models to support subject matter. The instructor gives an explanation of serving size versus portion size, changes in portions sizes over the last 20 years, and what consists of a serving from each food group. Flowsheet Row CARDIAC REHAB PHASE II EXERCISE from 05/16/2016 in [place]  Date  05/09/16  Educator  RD  Instruction Review Code  2- meets goals/outcomes      Stress Management:  -Group instruction provided by verbal instruction, video, and written materials to support subject matter.  Instructors review role of stress in heart disease and how to cope with stress positively.   Flowsheet Row CARDIAC REHAB PHASE II EXERCISE from 05/16/2016 in [place]  Date  05/02/16  Educator  Andi Hence, RN  Instruction Review Code  2- meets goals/outcomes  Exercising on Your Own:  -Group instruction provided by verbal instruction, power point, and written materials to support subject.  Instructors discuss benefits of exercise, components of exercise, frequency and intensity of exercise, and end points for exercise.  Also discuss use of nitroglycerin and activating EMS.  Review options of places to exercise outside of rehab.  Review guidelines for sex with heart disease.   Cardiac Drugs I:  -Group instruction provided by verbal instruction and written materials to support subject.  Instructor reviews cardiac drug classes: antiplatelets, anticoagulants, beta blockers, and statins.  Instructor discusses reasons, side effects, and lifestyle considerations for each drug class. Flowsheet Row CARDIAC REHAB PHASE II EXERCISE from 05/16/2016 in [place]  Date  05/16/16  Educator  pharm D  Instruction Review Code  2- meets goals/outcomes      Cardiac Drugs II:  -Group instruction provided by verbal instruction and written materials to support subject.  Instructor reviews cardiac drug classes: angiotensin converting enzyme inhibitors (ACE-I), angiotensin II receptor blockers (ARBs), nitrates, and calcium channel blockers.  Instructor discusses reasons, side effects, and lifestyle considerations for each drug class. Flowsheet Row CARDIAC REHAB PHASE II EXERCISE from 05/16/2016 in [place]  Date  04/11/16  Instruction Review Code  2- meets goals/outcomes      Anatomy and Physiology of the Circulatory System:  -Group instruction provided by verbal instruction, video, and written materials to support subject.  Reviews functional anatomy of heart, how it relates to various diagnoses, and what role the heart plays in the overall system. Flowsheet Row CARDIAC REHAB PHASE II EXERCISE  from 05/16/2016 in [place]  Date  04/18/16  Instruction Review Code  2- meets goals/outcomes      Knowledge Questionnaire Score:     Knowledge Questionnaire Score - 03/08/16 1219      Knowledge Questionnaire Score   Pre Score 19/24      Core Components/Risk Factors/Patient Goals at Admission:     Personal Goals and Risk Factors at Admission - 03/08/16 0926      Core Components/Risk Factors/Patient Goals on Admission    Weight Management Yes;Weight Loss   Intervention Weight Management: Develop a combined nutrition and exercise program designed to reach desired caloric intake, while maintaining appropriate intake of nutrient and fiber, sodium and fats, and appropriate energy expenditure required for the weight goal.;Weight Management: Provide education and appropriate resources to help participant work on and attain dietary goals.;Weight Management/Obesity: Establish reasonable short term and long term weight goals.   Expected Outcomes Short Term: Continue to assess and modify interventions until short term weight is achieved;Weight Maintenance: Understanding of the daily nutrition guidelines, which includes 25-35% calories from fat, 7% or less cal from saturated fats, less than 268m cholesterol, less than 1.5gm of sodium, & 5 or more servings of fruits and vegetables daily;Weight Loss: Understanding of general recommendations for a balanced deficit meal plan, which promotes 1-2 lb weight loss per week and includes a negative energy balance of [phone] kcal/d;Understanding recommendations for meals to include 15-35% energy as protein, 25-35% energy from fat, 35-60% energy from carbohydrates, less than 2059mof dietary cholesterol, 20-35 gm of total fiber daily;Understanding of distribution of calorie intake throughout the day with the consumption of 4-5 meals/snacks   Increase Strength and Stamina Yes   Intervention Provide advice, education, support and  counseling about physical activity/exercise needs.;Develop an individualized exercise prescription for aerobic and resistive training based on initial evaluation  findings, risk stratification, comorbidities and participant's personal goals.   Expected Outcomes Achievement of increased cardiorespiratory fitness and enhanced flexibility, muscular endurance and strength shown through measurements of functional capacity and personal statement of participant.   Improve shortness of breath with ADL's Yes   Personal Goal Other Yes   Personal Goal short: be heart healthy ( no cardiac events or hospitalizations) eat healthier   long: be able to walk with less SOB and increase energy. Lose weight goal 145-150lbs   Intervention provide nutrition education and exercise programming to assist with weightloss, increased energy and building exercise capacity. Also increased cardiac awareness and risks factors to decrease future cardiac events and hospitalizations   Expected Outcomes be able to walk with little to no SOB, lose weight and no future cardiac events      Core Components/Risk Factors/Patient Goals Review:      Goals and Risk Factor Review    Row Name 04/09/16 1405 04/09/16 1609 05/11/16 1452         Core Components/Risk Factors/Patient Goals Review   Personal Goals Review Improve shortness of breath with ADL's;Weight Management/Obesity  - Other     Review No more SOB with activities and weight is down 4lbs. Monitoring BP at home and complaint with HEP No more SOB with activities and weight is down 4lbs. Monitoring BP at home and complaint with HEP Feels as though heart is getting stronger. Weight is going down. Would like for BP to be managed better     Expected Outcomes Continue with HEP, weightloss and experience little to no SOB with activities.  - Pt will continue to get stronger, lose wt and better control of BP        Core Components/Risk Factors/Patient Goals at Discharge (Final Review):       Goals and Risk Factor Review - 05/11/16 1452      Core Components/Risk Factors/Patient Goals Review   Personal Goals Review Other   Review Feels as though heart is getting stronger. Weight is going down. Would like for BP to be managed better   Expected Outcomes Pt will continue to get stronger, lose wt and better control of BP      ITP Comments:     ITP Comments    Row Name 03/08/16 0921 05/04/16 1544         ITP Comments Dr. Fransico Him, medical director attended Hypertension video/lecture education offering/met outcome, goals         Comments: Pt is making expected progress toward personal goals after completing 26 sessions. Recommend continued exercise and life style modification education including  stress management and relaxation techniques to decrease cardiac risk profile.

## 2016-05-21 ENCOUNTER — Encounter (HOSPITAL_COMMUNITY)
Admission: RE | Admit: 2016-05-21 | Discharge: 2016-05-21 | Disposition: A | Discharge status: Home / Self Care | Payer: BLUE CROSS/BLUE SHIELD | Source: Ambulatory Visit | Attending: Cardiovascular Disease | Admitting: Cardiovascular Disease

## 2016-05-21 DIAGNOSIS — I214 Non-ST elevation (NSTEMI) myocardial infarction: Secondary | ICD-10-CM

## 2016-05-21 DIAGNOSIS — Z79899 Other long term (current) drug therapy: Secondary | ICD-10-CM | POA: Diagnosis not present

## 2016-05-21 DIAGNOSIS — I252 Old myocardial infarction: Secondary | ICD-10-CM | POA: Diagnosis not present

## 2016-05-21 DIAGNOSIS — K5090 Crohn's disease, unspecified, without complications: Secondary | ICD-10-CM | POA: Diagnosis not present

## 2016-05-21 DIAGNOSIS — I119 Hypertensive heart disease without heart failure: Secondary | ICD-10-CM | POA: Diagnosis not present

## 2016-05-23 ENCOUNTER — Encounter (HOSPITAL_COMMUNITY)
Admission: RE | Admit: 2016-05-23 | Discharge: 2016-05-23 | Disposition: A | Discharge status: Home / Self Care | Payer: BLUE CROSS/BLUE SHIELD | Source: Ambulatory Visit | Attending: Cardiovascular Disease | Admitting: Cardiovascular Disease

## 2016-05-23 DIAGNOSIS — K5090 Crohn's disease, unspecified, without complications: Secondary | ICD-10-CM | POA: Diagnosis not present

## 2016-05-23 DIAGNOSIS — I214 Non-ST elevation (NSTEMI) myocardial infarction: Secondary | ICD-10-CM

## 2016-05-23 DIAGNOSIS — I252 Old myocardial infarction: Secondary | ICD-10-CM | POA: Diagnosis not present

## 2016-05-23 DIAGNOSIS — Z79899 Other long term (current) drug therapy: Secondary | ICD-10-CM | POA: Diagnosis not present

## 2016-05-23 DIAGNOSIS — I119 Hypertensive heart disease without heart failure: Secondary | ICD-10-CM | POA: Diagnosis not present

## 2016-05-25 ENCOUNTER — Encounter (HOSPITAL_COMMUNITY): Payer: BLUE CROSS/BLUE SHIELD

## 2016-05-30 ENCOUNTER — Encounter (HOSPITAL_COMMUNITY): Admission: RE | Admit: 2016-05-30 | Payer: BLUE CROSS/BLUE SHIELD | Source: Ambulatory Visit

## 2016-06-01 ENCOUNTER — Encounter (HOSPITAL_COMMUNITY): Admission: RE | Admit: 2016-06-01 | Payer: BLUE CROSS/BLUE SHIELD | Source: Ambulatory Visit

## 2016-06-06 ENCOUNTER — Encounter (HOSPITAL_COMMUNITY): Payer: BLUE CROSS/BLUE SHIELD

## 2016-06-08 ENCOUNTER — Encounter (HOSPITAL_COMMUNITY): Payer: BLUE CROSS/BLUE SHIELD

## 2016-06-12 ENCOUNTER — Ambulatory Visit (INDEPENDENT_AMBULATORY_CARE_PROVIDER_SITE_OTHER): Payer: BLUE CROSS/BLUE SHIELD | Admitting: Family Medicine

## 2016-06-12 ENCOUNTER — Encounter: Payer: Self-pay | Admitting: Family Medicine

## 2016-06-12 ENCOUNTER — Ambulatory Visit (HOSPITAL_BASED_OUTPATIENT_CLINIC_OR_DEPARTMENT_OTHER)
Admission: RE | Admit: 2016-06-12 | Discharge: 2016-06-12 | Disposition: A | Discharge status: Home / Self Care | Payer: BLUE CROSS/BLUE SHIELD | Source: Ambulatory Visit | Attending: Family Medicine | Admitting: Family Medicine

## 2016-06-12 ENCOUNTER — Other Ambulatory Visit (HOSPITAL_COMMUNITY)
Admission: RE | Admit: 2016-06-12 | Discharge: 2016-06-12 | Disposition: A | Discharge status: Home / Self Care | Payer: BLUE CROSS/BLUE SHIELD | Source: Ambulatory Visit | Attending: Family Medicine | Admitting: Family Medicine

## 2016-06-12 VITALS — BP 148/98 | HR 95 | Resp 16 | Ht 66.0 in | Wt 167.8 lb

## 2016-06-12 DIAGNOSIS — Z1239 Encounter for other screening for malignant neoplasm of breast: Secondary | ICD-10-CM | POA: Diagnosis not present

## 2016-06-12 DIAGNOSIS — I252 Old myocardial infarction: Secondary | ICD-10-CM | POA: Diagnosis not present

## 2016-06-12 DIAGNOSIS — Z0000 Encounter for general adult medical examination without abnormal findings: Secondary | ICD-10-CM

## 2016-06-12 DIAGNOSIS — I10 Essential (primary) hypertension: Secondary | ICD-10-CM | POA: Diagnosis not present

## 2016-06-12 DIAGNOSIS — Z1231 Encounter for screening mammogram for malignant neoplasm of breast: Secondary | ICD-10-CM | POA: Diagnosis not present

## 2016-06-12 DIAGNOSIS — Z01419 Encounter for gynecological examination (general) (routine) without abnormal findings: Secondary | ICD-10-CM | POA: Diagnosis not present

## 2016-06-12 DIAGNOSIS — Z1151 Encounter for screening for human papillomavirus (HPV): Secondary | ICD-10-CM | POA: Diagnosis not present

## 2016-06-12 LAB — POCT URINALYSIS DIPSTICK
Bilirubin, UA: NEGATIVE
Blood, UA: NEGATIVE
GLUCOSE UA: NEGATIVE
KETONES UA: NEGATIVE
LEUKOCYTES UA: NEGATIVE
Nitrite, UA: NEGATIVE
PROTEIN UA: NEGATIVE
SPEC GRAV UA: 1.02
Urobilinogen, UA: 0.2
pH, UA: 7

## 2016-06-12 NOTE — Progress Notes (Signed)
Subjective:     Melody Gardner is a 50 y.o. female and is here for a comprehensive physical exam. The patient reports no new problems.  Social History   Social History  . Marital status: Married    Spouse name: N/A  . Number of children: N/A  . Years of education: N/A   Occupational History  .      [employer]   Social History Main Topics  . Smoking status: Never Smoker  . Smokeless tobacco: Never Used  . Alcohol use No  . Drug use: No  . Sexual activity: Yes    Birth control/ protection: Surgical   Other Topics Concern  . Not on file   Social History Narrative  . No narrative on file   Health Maintenance  Topic Date Due  . MAMMOGRAM  01/19/2016  . INFLUENZA VACCINE  06/12/2017 (Originally 05/01/2016)  . HIV Screening  06/12/2017 (Originally 01/18/1981)  . PAP SMEAR  10/07/2016  . TETANUS/TDAP  01/07/2024  . COLONOSCOPY  09/22/2025    The following portions of the patient's history were reviewed and updated as appropriate:  She  has a past medical history of Acute MI, lateral wall, initial episode of care ([place]) (12/18/2015); Anemia (2013); Crohn's disease ([place]); Depression; Hypertension (2012); and Hypertensive heart disease (12/19/2015). She  does not have any pertinent problems on file. She  has a past surgical history that includes Renal artery stent; Laparoscopy for ectopic pregnancy; Tubal ligation (1989); Endometrial ablation (2013); and Cardiac catheterization (N/A, 12/18/2015). Her family history includes Cancer in her brother; Diabetes in her brother and father; Hyperlipidemia in her father; Hypertension in her mother; Neuropathy in her brother and father; Stroke in her father. She  reports that she has never smoked. She has never used smokeless tobacco. She reports that she does not drink alcohol or use drugs. She has a current medication list which includes the following prescription(s): aspirin ec, azathioprine, balsalazide, losartan, multiple vitamins-minerals,  nebivolol, nitroglycerin, and tramadol. Current Outpatient Prescriptions on File Prior to Visit  Medication Sig Dispense Refill  . aspirin EC 81 MG tablet Take 1 tablet (81 mg total) by mouth daily. 30 tablet 12  . azaTHIOprine (IMURAN) 50 MG tablet Take 50 mg by mouth 3 (three) times daily.    . balsalazide (COLAZAL) 750 MG capsule Take 2,250 mg by mouth 3 (three) times daily.    Marland Kitchen losartan (COZAAR) 25 MG tablet Take 1 tablet (25 mg total) by mouth 2 (two) times daily. 180 tablet 3  . Multiple Vitamins-Minerals (WOMENS ONE DAILY PO) Take 1 tablet by mouth daily.    . nebivolol (BYSTOLIC) 5 MG tablet Take 1 tablet (5 mg total) by mouth daily. 90 tablet 3  . nitroGLYCERIN (NITROSTAT) 0.4 MG SL tablet Place 1 tablet (0.4 mg total) under the tongue every 5 (five) minutes as needed for chest pain. 25 tablet 1  . traMADol (ULTRAM) 50 MG tablet Take by mouth every 6 (six) hours as needed.     No current facility-administered medications on file prior to visit.    She is allergic to folic acid; sulfa antibiotics; and sulfasalazine..  Review of Systems Review of Systems  Constitutional: Negative for activity change, appetite change and fatigue.  HENT: Negative for hearing loss, congestion, tinnitus and ear discharge.  dentist [id number]: Negative for visual disturbance (see optho q1y -- vision corrected to 20/20 with glasses).  Respiratory: Negative for cough, chest tightness and shortness of breath.   Cardiovascular: Negative for chest pain,  palpitations and leg swelling.  Gastrointestinal: Negative for abdominal pain, diarrhea, constipation and abdominal distention.  Genitourinary: Negative for urgency, frequency, decreased urine volume and difficulty urinating.  Musculoskeletal: Negative for back pain, arthralgias and gait problem.  Skin: Negative for color change, pallor and rash.  Neurological: Negative for dizziness, light-headedness, numbness and headaches.  Hematological: Negative for  adenopathy. Does not bruise/bleed easily.  Psychiatric/Behavioral: Negative for suicidal ideas, confusion, sleep disturbance, self-injury, dysphoric mood, decreased concentration and agitation.     Objective:    BP (!) 148/98 (BP Location: Right Arm, Patient Position: Sitting, Cuff Size: Normal)   Pulse 95   Resp 16   Ht 5' 6"  (1.676 m)   Wt 167 lb 12.8 oz (76.1 kg)   LMP 06/01/2016 (Exact Date)   SpO2 100%   BMI 27.08 kg/m  General appearance: alert, cooperative, appears stated age and no distress Head: Normocephalic, without obvious abnormality, atraumatic Eyes: conjunctivae/corneas clear. PERRL, EOM's intact. Fundi benign. Ears: normal TM's and external ear canals both ears Nose: Nares normal. Septum midline. Mucosa normal. No drainage or sinus tenderness. Throat: lips, mucosa, and tongue normal; teeth and gums normal Neck: no adenopathy, no carotid bruit, no JVD, supple, symmetrical, trachea midline and thyroid not enlarged, symmetric, no tenderness/mass/nodules Back: symmetric, no curvature. ROM normal. No CVA tenderness. Lungs: clear to auscultation bilaterally Breasts: normal appearance, no masses or tenderness Heart: regular rate and rhythm, S1, S2 normal, no murmur, click, rub or gallop Abdomen: soft, non-tender; bowel sounds normal; no masses,  no organomegaly Pelvic: deferred Extremities: extremities normal, atraumatic, no cyanosis or edema Pulses: 2+ and symmetric Skin: Skin color, texture, turgor normal. No rashes or lesions Lymph nodes: Cervical, supraclavicular, and axillary nodes normal. Neurologic: Alert and oriented X 3, normal strength and tone. Normal symmetric reflexes. Normal coordination and gait     Assessment:    Healthy female exam.      Plan:     ghm utd See After Visit Summary for Counseling Recommendations    1. Breast cancer screening  - MM DIGITAL SCREENING BILATERAL; Future  2. Preventative health care See above - Lipid panel - CBC  with Differential/Platelet - POCT urinalysis dipstick - TSH - Comprehensive metabolic panel - Cytology - PAP  3. History of MI (myocardial infarction)  - Lipid panel - CBC with Differential/Platelet - POCT urinalysis dipstick - TSH - Comprehensive metabolic panel  4. Essential hypertension Stable con't losartan - Lipid panel - CBC with Differential/Platelet - POCT urinalysis dipstick - TSH - Comprehensive metabolic panel

## 2016-06-12 NOTE — Patient Instructions (Signed)
Preventive Care for Adults, Female A healthy lifestyle and preventive care can promote health and wellness. Preventive health guidelines for women include the following key practices.  A routine yearly physical is a good way to check with your health care provider about your health and preventive screening. It is a chance to share any concerns and updates on your health and to receive a thorough exam.  Visit your dentist for a routine exam and preventive care every 6 months. Brush your teeth twice a day and floss once a day. Good oral hygiene prevents tooth decay and gum disease.  The frequency of eye exams is based on your age, health, family medical history, use of contact lenses, and other factors. Follow your health care provider's recommendations for frequency of eye exams.  Eat a healthy diet. Foods like vegetables, fruits, whole grains, low-fat dairy products, and lean protein foods contain the nutrients you need without too many calories. Decrease your intake of foods high in solid fats, added sugars, and salt. Eat the right amount of calories for you.Get information about a proper diet from your health care provider, if necessary.  Regular physical exercise is one of the most important things you can do for your health. Most adults should get at least 150 minutes of moderate-intensity exercise (any activity that increases your heart rate and causes you to sweat) each week. In addition, most adults need muscle-strengthening exercises on 2 or more days a week.  Maintain a healthy weight. The body mass index (BMI) is a screening tool to identify possible weight problems. It provides an estimate of body fat based on height and weight. Your health care provider can find your BMI and can help you achieve or maintain a healthy weight.For adults 20 years and older:  A BMI below 18.5 is considered underweight.  A BMI of 18.5 to 24.9 is normal.  A BMI of 25 to 29.9 is considered overweight.  A  BMI of 30 and above is considered obese.  Maintain normal blood lipids and cholesterol levels by exercising and minimizing your intake of saturated fat. Eat a balanced diet with plenty of fruit and vegetables. Blood tests for lipids and cholesterol should begin at age 45 and be repeated every 5 years. If your lipid or cholesterol levels are high, you are over 50, or you are at high risk for heart disease, you may need your cholesterol levels checked more frequently.Ongoing high lipid and cholesterol levels should be treated with medicines if diet and exercise are not working.  If you smoke, find out from your health care provider how to quit. If you do not use tobacco, do not start.  Lung cancer screening is recommended for adults aged 45-80 years who are at high risk for developing lung cancer because of a history of smoking. A yearly low-dose CT scan of the lungs is recommended for people who have at least a 30-pack-year history of smoking and are a current smoker or have quit within the past 15 years. A pack year of smoking is smoking an average of 1 pack of cigarettes a day for 1 year (for example: 1 pack a day for 30 years or 2 packs a day for 15 years). Yearly screening should continue until the smoker has stopped smoking for at least 15 years. Yearly screening should be stopped for people who develop a health problem that would prevent them from having lung cancer treatment.  If you are pregnant, do not drink alcohol. If you are  breastfeeding, be very cautious about drinking alcohol. If you are not pregnant and choose to drink alcohol, do not have more than 1 drink per day. One drink is considered to be 12 ounces (355 mL) of beer, 5 ounces (148 mL) of wine, or 1.5 ounces (44 mL) of liquor.  Avoid use of street drugs. Do not share needles with anyone. Ask for help if you need support or instructions about stopping the use of drugs.  High blood pressure causes heart disease and increases the risk  of stroke. Your blood pressure should be checked at least every 1 to 2 years. Ongoing high blood pressure should be treated with medicines if weight loss and exercise do not work.  If you are 55-79 years old, ask your health care provider if you should take aspirin to prevent strokes.  Diabetes screening is done by taking a blood sample to check your blood glucose level after you have not eaten for a certain period of time (fasting). If you are not overweight and you do not have risk factors for diabetes, you should be screened once every 3 years starting at age 45. If you are overweight or obese and you are 40-70 years of age, you should be screened for diabetes every year as part of your cardiovascular risk assessment.  Breast cancer screening is essential preventive care for women. You should practice "breast self-awareness." This means understanding the normal appearance and feel of your breasts and may include breast self-examination. Any changes detected, no matter how small, should be reported to a health care provider. Women in their 20s and 30s should have a clinical breast exam (CBE) by a health care provider as part of a regular health exam every 1 to 3 years. After age 40, women should have a CBE every year. Starting at age 40, women should consider having a mammogram (breast X-ray test) every year. Women who have a family history of breast cancer should talk to their health care provider about genetic screening. Women at a high risk of breast cancer should talk to their health care providers about having an MRI and a mammogram every year.  Breast cancer gene (BRCA)-related cancer risk assessment is recommended for women who have family members with BRCA-related cancers. BRCA-related cancers include breast, ovarian, tubal, and peritoneal cancers. Having family members with these cancers may be associated with an increased risk for harmful changes (mutations) in the breast cancer genes BRCA1 and  BRCA2. Results of the assessment will determine the need for genetic counseling and BRCA1 and BRCA2 testing.  Your health care provider may recommend that you be screened regularly for cancer of the pelvic organs (ovaries, uterus, and vagina). This screening involves a pelvic examination, including checking for microscopic changes to the surface of your cervix (Pap test). You may be encouraged to have this screening done every 3 years, beginning at age 21.  For women ages 30-65, health care providers may recommend pelvic exams and Pap testing every 3 years, or they may recommend the Pap and pelvic exam, combined with testing for human papilloma virus (HPV), every 5 years. Some types of HPV increase your risk of cervical cancer. Testing for HPV may also be done on women of any age with unclear Pap test results.  Other health care providers may not recommend any screening for nonpregnant women who are considered low risk for pelvic cancer and who do not have symptoms. Ask your health care provider if a screening pelvic exam is right for   you.  If you have had past treatment for cervical cancer or a condition that could lead to cancer, you need Pap tests and screening for cancer for at least 20 years after your treatment. If Pap tests have been discontinued, your risk factors (such as having a new sexual partner) need to be reassessed to determine if screening should resume. Some women have medical problems that increase the chance of getting cervical cancer. In these cases, your health care provider may recommend more frequent screening and Pap tests.  Colorectal cancer can be detected and often prevented. Most routine colorectal cancer screening begins at the age of 50 years and continues through age 75 years. However, your health care provider may recommend screening at an earlier age if you have risk factors for colon cancer. On a yearly basis, your health care provider may provide home test kits to check  for hidden blood in the stool. Use of a small camera at the end of a tube, to directly examine the colon (sigmoidoscopy or colonoscopy), can detect the earliest forms of colorectal cancer. Talk to your health care provider about this at age 50, when routine screening begins. Direct exam of the colon should be repeated every 5-10 years through age 75 years, unless early forms of precancerous polyps or small growths are found.  People who are at an increased risk for hepatitis B should be screened for this virus. You are considered at high risk for hepatitis B if:  You were born in a country where hepatitis B occurs often. Talk with your health care provider about which countries are considered high risk.  Your parents were born in a high-risk country and you have not received a shot to protect against hepatitis B (hepatitis B vaccine).  You have HIV or AIDS.  You use needles to inject street drugs.  You live with, or have sex with, someone who has hepatitis B.  You get hemodialysis treatment.  You take certain medicines for conditions like cancer, organ transplantation, and autoimmune conditions.  Hepatitis C blood testing is recommended for all people born from 1945 through 1965 and any individual with known risks for hepatitis C.  Practice safe sex. Use condoms and avoid high-risk sexual practices to reduce the spread of sexually transmitted infections (STIs). STIs include gonorrhea, chlamydia, syphilis, trichomonas, herpes, HPV, and human immunodeficiency virus (HIV). Herpes, HIV, and HPV are viral illnesses that have no cure. They can result in disability, cancer, and death.  You should be screened for sexually transmitted illnesses (STIs) including gonorrhea and chlamydia if:  You are sexually active and are younger than 24 years.  You are older than 24 years and your health care provider tells you that you are at risk for this type of infection.  Your sexual activity has changed  since you were last screened and you are at an increased risk for chlamydia or gonorrhea. Ask your health care provider if you are at risk.  If you are at risk of being infected with HIV, it is recommended that you take a prescription medicine daily to prevent HIV infection. This is called preexposure prophylaxis (PrEP). You are considered at risk if:  You are sexually active and do not regularly use condoms or know the HIV status of your partner(s).  You take drugs by injection.  You are sexually active with a partner who has HIV.  Talk with your health care provider about whether you are at high risk of being infected with HIV. If   you choose to begin PrEP, you should first be tested for HIV. You should then be tested every 3 months for as long as you are taking PrEP.  Osteoporosis is a disease in which the bones lose minerals and strength with aging. This can result in serious bone fractures or breaks. The risk of osteoporosis can be identified using a bone density scan. Women ages 67 years and over and women at risk for fractures or osteoporosis should discuss screening with their health care providers. Ask your health care provider whether you should take a calcium supplement or vitamin D to reduce the rate of osteoporosis.  Menopause can be associated with physical symptoms and risks. Hormone replacement therapy is available to decrease symptoms and risks. You should talk to your health care provider about whether hormone replacement therapy is right for you.  Use sunscreen. Apply sunscreen liberally and repeatedly throughout the day. You should seek shade when your shadow is shorter than you. Protect yourself by wearing long sleeves, pants, a wide-brimmed hat, and sunglasses year round, whenever you are outdoors.  Once a month, do a whole body skin exam, using a mirror to look at the skin on your back. Tell your health care provider of new moles, moles that have irregular borders, moles that  are larger than a pencil eraser, or moles that have changed in shape or color.  Stay current with required vaccines (immunizations).  Influenza vaccine. All adults should be immunized every year.  Tetanus, diphtheria, and acellular pertussis (Td, Tdap) vaccine. Pregnant women should receive 1 dose of Tdap vaccine during each pregnancy. The dose should be obtained regardless of the length of time since the last dose. Immunization is preferred during the 27th-36th week of gestation. An adult who has not previously received Tdap or who does not know her vaccine status should receive 1 dose of Tdap. This initial dose should be followed by tetanus and diphtheria toxoids (Td) booster doses every 10 years. Adults with an unknown or incomplete history of completing a 3-dose immunization series with Td-containing vaccines should begin or complete a primary immunization series including a Tdap dose. Adults should receive a Td booster every 10 years.  Varicella vaccine. An adult without evidence of immunity to varicella should receive 2 doses or a second dose if she has previously received 1 dose. Pregnant females who do not have evidence of immunity should receive the first dose after pregnancy. This first dose should be obtained before leaving the health care facility. The second dose should be obtained 4-8 weeks after the first dose.  Human papillomavirus (HPV) vaccine. Females aged 13-26 years who have not received the vaccine previously should obtain the 3-dose series. The vaccine is not recommended for use in pregnant females. However, pregnancy testing is not needed before receiving a dose. If a female is found to be pregnant after receiving a dose, no treatment is needed. In that case, the remaining doses should be delayed until after the pregnancy. Immunization is recommended for any person with an immunocompromised condition through the age of 61 years if she did not get any or all doses earlier. During the  3-dose series, the second dose should be obtained 4-8 weeks after the first dose. The third dose should be obtained 24 weeks after the first dose and 16 weeks after the second dose.  Zoster vaccine. One dose is recommended for adults aged 30 years or older unless certain conditions are present.  Measles, mumps, and rubella (MMR) vaccine. Adults born  before 1957 generally are considered immune to measles and mumps. Adults born in 1957 or later should have 1 or more doses of MMR vaccine unless there is a contraindication to the vaccine or there is laboratory evidence of immunity to each of the three diseases. A routine second dose of MMR vaccine should be obtained at least 28 days after the first dose for students attending postsecondary schools, health care workers, or international travelers. People who received inactivated measles vaccine or an unknown type of measles vaccine during 1963-1967 should receive 2 doses of MMR vaccine. People who received inactivated mumps vaccine or an unknown type of mumps vaccine before 1979 and are at high risk for mumps infection should consider immunization with 2 doses of MMR vaccine. For females of childbearing age, rubella immunity should be determined. If there is no evidence of immunity, females who are not pregnant should be vaccinated. If there is no evidence of immunity, females who are pregnant should delay immunization until after pregnancy. Unvaccinated health care workers born before 1957 who lack laboratory evidence of measles, mumps, or rubella immunity or laboratory confirmation of disease should consider measles and mumps immunization with 2 doses of MMR vaccine or rubella immunization with 1 dose of MMR vaccine.  Pneumococcal 13-valent conjugate (PCV13) vaccine. When indicated, a person who is uncertain of his immunization history and has no record of immunization should receive the PCV13 vaccine. All adults 65 years of age and older should receive this  vaccine. An adult aged 19 years or older who has certain medical conditions and has not been previously immunized should receive 1 dose of PCV13 vaccine. This PCV13 should be followed with a dose of pneumococcal polysaccharide (PPSV23) vaccine. Adults who are at high risk for pneumococcal disease should obtain the PPSV23 vaccine at least 8 weeks after the dose of PCV13 vaccine. Adults older than 50 years of age who have normal immune system function should obtain the PPSV23 vaccine dose at least 1 year after the dose of PCV13 vaccine.  Pneumococcal polysaccharide (PPSV23) vaccine. When PCV13 is also indicated, PCV13 should be obtained first. All adults aged 65 years and older should be immunized. An adult younger than age 65 years who has certain medical conditions should be immunized. Any person who resides in a nursing home or long-term care facility should be immunized. An adult smoker should be immunized. People with an immunocompromised condition and certain other conditions should receive both PCV13 and PPSV23 vaccines. People with human immunodeficiency virus (HIV) infection should be immunized as soon as possible after diagnosis. Immunization during chemotherapy or radiation therapy should be avoided. Routine use of PPSV23 vaccine is not recommended for American Indians, [place] Natives, or people younger than 65 years unless there are medical conditions that require PPSV23 vaccine. When indicated, people who have unknown immunization and have no record of immunization should receive PPSV23 vaccine. One-time revaccination 5 years after the first dose of PPSV23 is recommended for people aged 19-64 years who have chronic kidney failure, nephrotic syndrome, asplenia, or immunocompromised conditions. People who received 1-2 doses of PPSV23 before age 65 years should receive another dose of PPSV23 vaccine at age 65 years or later if at least 5 years have passed since the previous dose. Doses of PPSV23 are not  needed for people immunized with PPSV23 at or after age 65 years.  Meningococcal vaccine. Adults with asplenia or persistent complement component deficiencies should receive 2 doses of quadrivalent meningococcal conjugate (MenACWY-D) vaccine. The doses should be obtained   at least 2 months apart. Microbiologists working with certain meningococcal bacteria, [place] recruits, people at risk during an outbreak, and people who travel to or live in countries with a high rate of meningitis should be immunized. A first-year college student up through age 34 years who is living in a residence hall should receive a dose if she did not receive a dose on or after her 16th birthday. Adults who have certain high-risk conditions should receive one or more doses of vaccine.  Hepatitis A vaccine. Adults who wish to be protected from this disease, have certain high-risk conditions, work with hepatitis A-infected animals, work in hepatitis A research labs, or travel to or work in countries with a high rate of hepatitis A should be immunized. Adults who were previously unvaccinated and who anticipate close contact with an international adoptee during the first 60 days after arrival in the [place] from a country with a high rate of hepatitis A should be immunized.  Hepatitis B vaccine. Adults who wish to be protected from this disease, have certain high-risk conditions, may be exposed to blood or other infectious body fluids, are household contacts or sex partners of hepatitis B positive people, are clients or workers in certain care facilities, or travel to or work in countries with a high rate of hepatitis B should be immunized.  Haemophilus influenzae type b (Hib) vaccine. A previously unvaccinated person with asplenia or sickle cell disease or having a scheduled splenectomy should receive 1 dose of Hib vaccine. Regardless of previous immunization, a recipient of a hematopoietic stem cell transplant should receive a  3-dose series 6-12 months after her successful transplant. Hib vaccine is not recommended for adults with HIV infection. Preventive Services / Frequency Ages 35 to 4 years  Blood pressure check.** / Every 3-5 years.  Lipid and cholesterol check.** / Every 5 years beginning at age 60.  Clinical breast exam.** / Every 3 years for women in their 71s and 10s.  BRCA-related cancer risk assessment.** / For women who have family members with a BRCA-related cancer (breast, ovarian, tubal, or peritoneal cancers).  Pap test.** / Every 2 years from ages 76 through 26. Every 3 years starting at age 61 through age 76 or 93 with a history of 3 consecutive normal Pap tests.  HPV screening.** / Every 3 years from ages 37 through ages 60 to 51 with a history of 3 consecutive normal Pap tests.  Hepatitis C blood test.** / For any individual with known risks for hepatitis C.  Skin self-exam. / Monthly.  Influenza vaccine. / Every year.  Tetanus, diphtheria, and acellular pertussis (Tdap, Td) vaccine.** / Consult your health care provider. Pregnant women should receive 1 dose of Tdap vaccine during each pregnancy. 1 dose of Td every 10 years.  Varicella vaccine.** / Consult your health care provider. Pregnant females who do not have evidence of immunity should receive the first dose after pregnancy.  HPV vaccine. / 3 doses over 6 months, if 93 and younger. The vaccine is not recommended for use in pregnant females. However, pregnancy testing is not needed before receiving a dose.  Measles, mumps, rubella (MMR) vaccine.** / You need at least 1 dose of MMR if you were born in 1957 or later. You may also need a 2nd dose. For females of childbearing age, rubella immunity should be determined. If there is no evidence of immunity, females who are not pregnant should be vaccinated. If there is no evidence of immunity, females who are  pregnant should delay immunization until after pregnancy.  Pneumococcal  13-valent conjugate (PCV13) vaccine.** / Consult your health care provider.  Pneumococcal polysaccharide (PPSV23) vaccine.** / 1 to 2 doses if you smoke cigarettes or if you have certain conditions.  Meningococcal vaccine.** / 1 dose if you are age 68 to 8 years and a Market researcher living in a residence hall, or have one of several medical conditions, you need to get vaccinated against meningococcal disease. You may also need additional booster doses.  Hepatitis A vaccine.** / Consult your health care provider.  Hepatitis B vaccine.** / Consult your health care provider.  Haemophilus influenzae type b (Hib) vaccine.** / Consult your health care provider. Ages 7 to 53 years  Blood pressure check.** / Every year.  Lipid and cholesterol check.** / Every 5 years beginning at age 25 years.  Lung cancer screening. / Every year if you are aged 11-80 years and have a 30-pack-year history of smoking and currently smoke or have quit within the past 15 years. Yearly screening is stopped once you have quit smoking for at least 15 years or develop a health problem that would prevent you from having lung cancer treatment.  Clinical breast exam.** / Every year after age 48 years.  BRCA-related cancer risk assessment.** / For women who have family members with a BRCA-related cancer (breast, ovarian, tubal, or peritoneal cancers).  Mammogram.** / Every year beginning at age 41 years and continuing for as long as you are in good health. Consult with your health care provider.  Pap test.** / Every 3 years starting at age 65 years through age 37 or 70 years with a history of 3 consecutive normal Pap tests.  HPV screening.** / Every 3 years from ages 72 years through ages 60 to 40 years with a history of 3 consecutive normal Pap tests.  Fecal occult blood test (FOBT) of stool. / Every year beginning at age 21 years and continuing until age 5 years. You may not need to do this test if you get  a colonoscopy every 10 years.  Flexible sigmoidoscopy or colonoscopy.** / Every 5 years for a flexible sigmoidoscopy or every 10 years for a colonoscopy beginning at age 35 years and continuing until age 48 years.  Hepatitis C blood test.** / For all people born from [birth year] through 1965 and any individual with known risks for hepatitis C.  Skin self-exam. / Monthly.  Influenza vaccine. / Every year.  Tetanus, diphtheria, and acellular pertussis (Tdap/Td) vaccine.** / Consult your health care provider. Pregnant women should receive 1 dose of Tdap vaccine during each pregnancy. 1 dose of Td every 10 years.  Varicella vaccine.** / Consult your health care provider. Pregnant females who do not have evidence of immunity should receive the first dose after pregnancy.  Zoster vaccine.** / 1 dose for adults aged 30 years or older.  Measles, mumps, rubella (MMR) vaccine.** / You need at least 1 dose of MMR if you were born in 1957 or later. You may also need a second dose. For females of childbearing age, rubella immunity should be determined. If there is no evidence of immunity, females who are not pregnant should be vaccinated. If there is no evidence of immunity, females who are pregnant should delay immunization until after pregnancy.  Pneumococcal 13-valent conjugate (PCV13) vaccine.** / Consult your health care provider.  Pneumococcal polysaccharide (PPSV23) vaccine.** / 1 to 2 doses if you smoke cigarettes or if you have certain conditions.  Meningococcal vaccine.** /  Consult your health care provider.  Hepatitis A vaccine.** / Consult your health care provider.  Hepatitis B vaccine.** / Consult your health care provider.  Haemophilus influenzae type b (Hib) vaccine.** / Consult your health care provider. Ages 64 years and over  Blood pressure check.** / Every year.  Lipid and cholesterol check.** / Every 5 years beginning at age 23 years.  Lung cancer screening. / Every year if you  are aged 16-80 years and have a 30-pack-year history of smoking and currently smoke or have quit within the past 15 years. Yearly screening is stopped once you have quit smoking for at least 15 years or develop a health problem that would prevent you from having lung cancer treatment.  Clinical breast exam.** / Every year after age 74 years.  BRCA-related cancer risk assessment.** / For women who have family members with a BRCA-related cancer (breast, ovarian, tubal, or peritoneal cancers).  Mammogram.** / Every year beginning at age 44 years and continuing for as long as you are in good health. Consult with your health care provider.  Pap test.** / Every 3 years starting at age 58 years through age 22 or 39 years with 3 consecutive normal Pap tests. Testing can be stopped between 65 and 70 years with 3 consecutive normal Pap tests and no abnormal Pap or HPV tests in the past 10 years.  HPV screening.** / Every 3 years from ages 64 years through ages 70 or 61 years with a history of 3 consecutive normal Pap tests. Testing can be stopped between 65 and 70 years with 3 consecutive normal Pap tests and no abnormal Pap or HPV tests in the past 10 years.  Fecal occult blood test (FOBT) of stool. / Every year beginning at age 40 years and continuing until age 27 years. You may not need to do this test if you get a colonoscopy every 10 years.  Flexible sigmoidoscopy or colonoscopy.** / Every 5 years for a flexible sigmoidoscopy or every 10 years for a colonoscopy beginning at age 7 years and continuing until age 32 years.  Hepatitis C blood test.** / For all people born from [birth year] through 1965 and any individual with known risks for hepatitis C.  Osteoporosis screening.** / A one-time screening for women ages 30 years and over and women at risk for fractures or osteoporosis.  Skin self-exam. / Monthly.  Influenza vaccine. / Every year.  Tetanus, diphtheria, and acellular pertussis (Tdap/Td)  vaccine.** / 1 dose of Td every 10 years.  Varicella vaccine.** / Consult your health care provider.  Zoster vaccine.** / 1 dose for adults aged 35 years or older.  Pneumococcal 13-valent conjugate (PCV13) vaccine.** / Consult your health care provider.  Pneumococcal polysaccharide (PPSV23) vaccine.** / 1 dose for all adults aged 46 years and older.  Meningococcal vaccine.** / Consult your health care provider.  Hepatitis A vaccine.** / Consult your health care provider.  Hepatitis B vaccine.** / Consult your health care provider.  Haemophilus influenzae type b (Hib) vaccine.** / Consult your health care provider. ** Family history and personal history of risk and conditions may change your health care provider's recommendations.   This information is not intended to replace advice given to you by your health care provider. Make sure you discuss any questions you have with your health care provider.   Document Released: 11/13/2001 Document Revised: 10/08/2014 Document Reviewed: 02/12/2011 Elsevier Interactive Patient Education [employer].

## 2016-06-12 NOTE — Progress Notes (Signed)
Pre visit review using our clinic review tool, if applicable. No additional management support is needed unless otherwise documented below in the visit note. 

## 2016-06-13 ENCOUNTER — Encounter (HOSPITAL_COMMUNITY): Payer: BLUE CROSS/BLUE SHIELD

## 2016-06-13 LAB — COMPREHENSIVE METABOLIC PANEL
ALK PHOS: 58 U/L (ref 39–117)
ALT: 9 U/L (ref 0–35)
AST: 18 U/L (ref 0–37)
Albumin: 4.1 g/dL (ref 3.5–5.2)
BILIRUBIN TOTAL: 1.3 mg/dL — AB (ref 0.2–1.2)
BUN: 12 mg/dL (ref 6–23)
CALCIUM: 9.4 mg/dL (ref 8.4–10.5)
CO2: 29 meq/L (ref 19–32)
CREATININE: 0.76 mg/dL (ref 0.40–1.20)
Chloride: 104 mEq/L (ref 96–112)
GFR: 103.43 mL/min (ref >60.00)
GLUCOSE: 78 mg/dL (ref 70–99)
Potassium: 4.5 mEq/L (ref 3.5–5.1)
Sodium: 140 mEq/L (ref 135–145)
TOTAL PROTEIN: 7.6 g/dL (ref 6.0–8.3)

## 2016-06-13 LAB — LIPID PANEL
CHOLESTEROL: 124 mg/dL (ref 0–200)
HDL: 44.7 mg/dL (ref >39.00)
LDL Cholesterol: 69 mg/dL (ref 0–99)
NONHDL: 79.31
Total CHOL/HDL Ratio: 3
Triglycerides: 53 mg/dL (ref 0.0–149.0)
VLDL: 10.6 mg/dL (ref 0.0–40.0)

## 2016-06-13 LAB — CBC WITH DIFFERENTIAL/PLATELET
BASOS PCT: 0.8 % (ref 0.0–3.0)
Basophils Absolute: 0 10*3/uL (ref 0.0–0.1)
EOS PCT: 1.4 % (ref 0.0–5.0)
Eosinophils Absolute: 0.1 10*3/uL (ref 0.0–0.7)
HEMATOCRIT: 39.3 % (ref 36.0–46.0)
HEMOGLOBIN: 13.3 g/dL (ref 12.0–15.0)
LYMPHS PCT: 23.7 % (ref 12.0–46.0)
Lymphs Abs: 1.4 10*3/uL (ref 0.7–4.0)
MCHC: 33.7 g/dL (ref 30.0–36.0)
MCV: 89.6 fl (ref 78.0–100.0)
MONO ABS: 0.3 10*3/uL (ref 0.1–1.0)
MONOS PCT: 4.9 % (ref 3.0–12.0)
Neutro Abs: 4.2 10*3/uL (ref 1.4–7.7)
Neutrophils Relative %: 69.2 % (ref 43.0–77.0)
Platelets: 395 10*3/uL (ref 150.0–400.0)
RBC: 4.38 Mil/uL (ref 3.87–5.11)
RDW: 15.8 % — AB (ref 11.5–15.5)
WBC: 6.1 10*3/uL (ref 4.0–10.5)

## 2016-06-13 LAB — TSH: TSH: 1.23 u[IU]/mL (ref 0.35–4.50)

## 2016-06-14 LAB — CYTOLOGY - PAP

## 2016-06-14 MED ORDER — LOSARTAN POTASSIUM 25 MG PO TABS: 25.0000 mg | ORAL_TABLET | Freq: Two times a day (BID) | ORAL | 3 refills | Status: DC

## 2016-06-15 ENCOUNTER — Ambulatory Visit (HOSPITAL_COMMUNITY): Payer: Self-pay | Admitting: Cardiac Rehabilitation

## 2016-06-15 ENCOUNTER — Telehealth: Payer: Self-pay | Admitting: *Deleted

## 2016-06-15 ENCOUNTER — Encounter (HOSPITAL_COMMUNITY): Payer: BLUE CROSS/BLUE SHIELD

## 2016-06-15 NOTE — Progress Notes (Signed)
Discharge Summary  Patient Details  Name: Raiya Stainback MRN: [id number] Date of Birth: 1966-03-30 Referring Provider:   Flowsheet Row CARDIAC REHAB PHASE II EXERCISE from 03/14/2016 in [place]  Referring Provider  Skeet Latch MD       Number of Visits: 30   Reason for Discharge:  Patient reached a stable level of exercise.  Insurance  Pt plans to exercise on her own and has returned to work.  Unfortunately, pt was not able to return to rehab to obtain final measurements and walk test assessment.  Although pt greatly demonstrated a sincerely desire to live heart healthy lifestyle and significantly increased her strength and stamina.  Smoking History:  History  Smoking Status  . Never Smoker  Smokeless Tobacco  . Never Used    Diagnosis:  No diagnosis found.  ADL UCSD:   Initial Exercise Prescription:   Discharge Exercise Prescription (Final Exercise Prescription Changes):     Exercise Prescription Changes - 05/24/16 1200      Exercise Review   Progression Yes     Response to Exercise   Blood Pressure (Admit) 124/90   Blood Pressure (Exercise) 120/84   Blood Pressure (Exit) 120/84   Heart Rate (Admit) 60 bpm   Heart Rate (Exercise) 107 bpm   Heart Rate (Exit) 60 bpm   Rating of Perceived Exertion (Exercise) 12   Duration Progress to 30 minutes of continuous aerobic without signs/symptoms of physical distress   Intensity THRR unchanged     Progression   Progression Continue to progress workloads to maintain intensity without signs/symptoms of physical distress.   Average METs 3.7     Resistance Training   Training Prescription Yes   Weight 5lbs   Reps 10-12     Treadmill   MPH 2.8   Grade 2   Minutes 10   METs 3.91     Bike   Level 0.8   Minutes 10   METs 2.94     NuStep   Level 4   Minutes 10   METs 3.5     Home Exercise Plan   Plans to continue exercise at Home  Reviewed HEP on 6/23. Pt plans to walk  at home for exercise   Frequency Add 2 additional days to program exercise sessions.      Functional Capacity:   Psychological, QOL, Others - Outcomes: PHQ 2/9: Depression screen [facility] 2/9 06/12/2016 04/19/2016  Decreased Interest 0 0  Down, Depressed, Hopeless 0 0  PHQ - 2 Score 0 0    Quality of Life:   Personal Goals: Goals established at orientation with interventions provided to work toward goal.    Personal Goals Discharge:     Goals and Risk Factor Review    Row Name 05/11/16 1452             Core Components/Risk Factors/Patient Goals Review   Personal Goals Review Other       Review Feels as though heart is getting stronger. Weight is going down. Would like for BP to be managed better       Expected Outcomes Pt will continue to get stronger, lose wt and better control of BP          Nutrition & Weight - Outcomes:    Nutrition:   Nutrition Discharge:   Education Questionnaire Score:   Goals reviewed with patient; copy given to patient.

## 2016-06-15 NOTE — Telephone Encounter (Signed)
Left message to call back  

## 2016-06-15 NOTE — Telephone Encounter (Signed)
Patient called and stated that Dr Oval Linsey told her to take losartan 50 mg bid, but a new rx was not sent to the pharmacy. Her pcp sent in an rx yesterday as below: Order Providers   Prescribing Provider Encounter Provider  Ann Held, DO Ann Held, DO  Medication Detail    Disp Refills Start End   losartan (COZAAR) 25 MG tablet 180 tablet 3 06/14/2016    Sig - Route: Take 1 tablet (25 mg total) by mouth 2 (two) times daily. - Oral   E-Prescribing Status: Receipt confirmed by pharmacy (06/14/2016 9:42 PM EDT)   Associated Diagnoses   Essential hypertension     Pharmacy   [id number] - [place], [place]   Patient can be reached at [phone]. Thanks, MI

## 2016-06-18 ENCOUNTER — Telehealth: Payer: Self-pay | Admitting: Cardiovascular Disease

## 2016-06-18 DIAGNOSIS — I10 Essential (primary) hypertension: Secondary | ICD-10-CM

## 2016-06-18 NOTE — Telephone Encounter (Signed)
Left message to call back  

## 2016-06-18 NOTE — Telephone Encounter (Signed)
Spoke with patient regarding Losartan Called pharmacy to verify receiving of Losartan 25 mg twice a day Per pharmacy insurance would not cover Losartan 25 mg but once a day. Rx was given for 50 mg 1/2 tablet twice a day so insurance would cover Spoke with patient and she has been taking the Losartan 50 mg a full tablet twice a day since first of August when she picked up RX She does monitor blood pressure at home and she has been running 130's/low 90's  Will forward to Dr Oval Linsey for recommendations

## 2016-06-18 NOTE — Telephone Encounter (Signed)
Returning your call. °

## 2016-06-18 NOTE — Telephone Encounter (Signed)
Follow up  Pt voiced she is returning Melinda's call.  Please f/u with pt.

## 2016-06-19 NOTE — Telephone Encounter (Signed)
Please fill it as 100 mg daily.  If she want to split it and take bid that's fine.  Otherwise it is OK for her to take the whole tablet once daily.

## 2016-06-20 MED ORDER — LOSARTAN POTASSIUM 100 MG PO TABS: 100.0000 mg | ORAL_TABLET | Freq: Every day | ORAL | 1 refills | Status: DC

## 2016-06-20 MED ORDER — LOSARTAN POTASSIUM 100 MG PO TABS: ORAL_TABLET | ORAL | 1 refills | Status: DC

## 2016-06-20 NOTE — Telephone Encounter (Signed)
Left message to call back  

## 2016-06-20 NOTE — Telephone Encounter (Signed)
Follow Up:; ° ° °Returning your call. °

## 2016-06-20 NOTE — Telephone Encounter (Signed)
Advised patient and will call in Rx for Losartan 100 mg daily

## 2016-06-27 ENCOUNTER — Encounter: Payer: Self-pay | Admitting: Vascular Surgery

## 2016-07-04 ENCOUNTER — Encounter: Payer: Self-pay | Admitting: Vascular Surgery

## 2016-07-04 ENCOUNTER — Ambulatory Visit (INDEPENDENT_AMBULATORY_CARE_PROVIDER_SITE_OTHER): Payer: BLUE CROSS/BLUE SHIELD | Admitting: Vascular Surgery

## 2016-07-04 ENCOUNTER — Ambulatory Visit (HOSPITAL_COMMUNITY)
Admission: RE | Admit: 2016-07-04 | Discharge: 2016-07-04 | Disposition: A | Discharge status: Home / Self Care | Payer: BLUE CROSS/BLUE SHIELD | Source: Ambulatory Visit | Attending: Vascular Surgery | Admitting: Vascular Surgery

## 2016-07-04 VITALS — BP 140/100 | HR 79 | Temp 98.2°F | Resp 16 | Ht 66.0 in | Wt 167.0 lb

## 2016-07-04 DIAGNOSIS — I773 Arterial fibromuscular dysplasia: Secondary | ICD-10-CM | POA: Diagnosis not present

## 2016-07-04 DIAGNOSIS — I6523 Occlusion and stenosis of bilateral carotid arteries: Secondary | ICD-10-CM | POA: Insufficient documentation

## 2016-07-04 LAB — VAS US CAROTID
LCCADDIAS: -25 cm/s
LCCAPSYS: 129 cm/s
LEFT ECA DIAS: -18 cm/s
LEFT VERTEBRAL DIAS: 14 cm/s
LICADDIAS: -55 cm/s
Left CCA dist sys: -60 cm/s
Left CCA prox dias: 30 cm/s
Left ICA dist sys: -140 cm/s
Left ICA prox dias: 14 cm/s
Left ICA prox sys: 45 cm/s
RCCADSYS: -129 cm/s
RCCAPDIAS: -19 cm/s
RIGHT CCA MID DIAS: -26 cm/s
RIGHT ECA DIAS: -32 cm/s
RIGHT VERTEBRAL DIAS: -16 cm/s
Right CCA prox sys: -66 cm/s

## 2016-07-04 NOTE — Progress Notes (Signed)
Patient name: Melody Gardner MRN: [id number] DOB: 03/22/1966 Sex: female  REASON FOR CONSULT: Carotid disease. Referred by Dr. Oval Linsey.  HPI: Melody Gardner is a 50 y.o. female, who was referred for evaluation of carotid disease.  I have reviewed the records that were sent from [facility]. The patient was seen by Dr. Oval Linsey on 04/26/2016. 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 She had hypertension and renal artery stenosis and underwent stenting of her renal artery stenosis. She has undergone a CT angiogram that did not show any evidence of eyebrow muscular dysplasia of the renal arteries.  I have also reviewed the carotid duplex scan that was done at the [facility] office on 01/11/2016. This did not show any significant carotid disease. The subclavian arteries were normal bilaterally.  On my history she denies any history of stroke, TIAs, expressive or receptive aphasia, or amaurosis fugax. She has had some neck pain.   Past Medical History:  Diagnosis Date  . Acute MI, lateral wall, initial episode of care ([place]) 12/18/2015  . Anemia 2013  . Crohn's disease ([place])   . Depression    pt states she is depressed but not medically diagnosed  . Hypertension 2012  . Hypertensive heart disease 12/19/2015    Family History  Problem Relation Age of Onset  . Hypertension Mother   . Cancer Brother   . Diabetes Brother   . Diabetes Father   . Stroke Father   . Neuropathy Father   . Hyperlipidemia Father   . Neuropathy Brother   . Thyroid disease Neg Hx     SOCIAL HISTORY: Social History   Social History  . Marital status: Married    Spouse name: N/A  . Number of children: N/A  . Years of education: N/A   Occupational History  .      [employer]   Social History Main Topics  . Smoking status: Never Smoker  . Smokeless tobacco: Never Used  . Alcohol use No  . Drug use: No  . Sexual activity: Yes    Birth  control/ protection: Surgical   Other Topics Concern  . Not on file   Social History Narrative  . No narrative on file    Allergies  Allergen Reactions  . Folic Acid Rash    Pt declines this allergy  . Sulfa Antibiotics Rash  . Sulfasalazine Itching and Rash    Current Outpatient Prescriptions  Medication Sig Dispense Refill  . aspirin EC 81 MG tablet Take 1 tablet (81 mg total) by mouth daily. 30 tablet 12  . azaTHIOprine (IMURAN) 50 MG tablet Take 50 mg by mouth 3 (three) times daily.    . balsalazide (COLAZAL) 750 MG capsule Take 2,250 mg by mouth 3 (three) times daily.    Marland Kitchen losartan (COZAAR) 100 MG tablet Take 1 tablet (100 mg total) by mouth daily. 90 tablet 1  . Multiple Vitamins-Minerals (WOMENS ONE DAILY PO) Take 1 tablet by mouth daily.    . nebivolol (BYSTOLIC) 5 MG tablet Take 1 tablet (5 mg total) by mouth daily. 90 tablet 3  . nitroGLYCERIN (NITROSTAT) 0.4 MG SL tablet Place 1 tablet (0.4 mg total) under the tongue every 5 (five) minutes as needed for chest pain. 25 tablet 1  . traMADol (ULTRAM) 50 MG tablet Take by mouth every 6 (six) hours as needed.     No current facility-administered medications for this visit.  REVIEW OF SYSTEMS:  [X]  denotes positive finding, [ ]  denotes negative finding Cardiac  Comments:  Chest pain or chest pressure: X No recent chest pain.   Shortness of breath upon exertion:    Short of breath when lying flat:    Irregular heart rhythm:        Vascular    Pain in calf, thigh, or hip brought on by ambulation:    Pain in feet at night that wakes you up from your sleep:     Blood clot in your veins:    Leg swelling:         Pulmonary    Oxygen at home:    Productive cough:     Wheezing:         Neurologic    Sudden weakness in arms or legs:     Sudden numbness in arms or legs:     Sudden onset of difficulty speaking or slurred speech:    Temporary loss of vision in one eye:     Problems with dizziness:           Gastrointestinal    Blood in stool:     Vomited blood:         Genitourinary    Burning when urinating:     Blood in urine:        Psychiatric    Major depression:         Hematologic    Bleeding problems:    Problems with blood clotting too easily:        Skin    Rashes or ulcers:        Constitutional    Fever or chills:      PHYSICAL EXAM: Vitals:   07/04/16 0959 07/04/16 1004  BP: (!) 130/100 (!) 140/100  Pulse: 79   Resp: 16   Temp: 98.2 F (36.8 C)   TempSrc: Oral   SpO2: 96%   Weight: 167 lb (75.8 kg)   Height: 5' 6"  (1.676 m)     GENERAL: The patient is a well-nourished female, in no acute distress. The vital signs are documented above. CARDIAC: There is a regular rate and rhythm.  VASCULAR: I do not detect carotid bruits. She has palpable femoral, popliteal, and pedal pulses bilaterally. She has no significant lower extremity swelling. PULMONARY: There is good air exchange bilaterally without wheezing or rales. ABDOMEN: Soft and non-tender with normal pitched bowel sounds.  MUSCULOSKELETAL: There are no major deformities or cyanosis. NEUROLOGIC: No focal weakness or paresthesias are detected. SKIN: There are no ulcers or rashes noted. PSYCHIATRIC: The patient has a normal affect.  DATA:   CAROTID DUPLEX: I have independently interpreted the carotid duplex scan in our office today. On the right side there is a less than 39% stenosis. On the left side, there is a less than 39% stenosis. Both internal carotid arteries are tortuous. Both vertebral arteries are patent with antegrade flow.   CT ANGIOGRAM OF NECK: This shows evidence of bilateral cervical ICA fibromuscular dysplasia. There was no significant stenosis. On my review of these images, these findings are subtle.   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 She is asymptomatic. I do not think any further vascular workup is indicated unless she developed any new focal neurologic symptoms. She has had some neck pain and  I do not think this would be related to her fibromuscular dysplasia. If her symptoms progress she may need workup for cervical disc disease. I'll be happy to see her back at anytime if any new vascular issues arise.   Deitra Mayo [facility] [phone]

## 2016-07-18 NOTE — Progress Notes (Signed)
Cardiology Office Note   Date:  07/24/2016   ID:  Melody Gardner, DOB 31-Dec-1965, MRN [id number]  PCP:  Ann Held, DO  Cardiologist:   Skeet Latch, MD   Chief Complaint  Patient presents with  . Follow-up    3 months; lightheaded; when getting up too fast. cramping in legs occasionally.      History of Present Illness: Melody Gardner is a 50 y.o. 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  Melody Gardner was admitted 12/18/15 with chest pain.  Cardiac enzymes were elevated and she had mild lateral ST elevation.  In the cath she was found to have a spontaneous dissection with 100% occlusion of D1 that was medically managed. Her echo showed LVEF 60-65% with grade 1 diastolic dysfunction.   Melody Gardner reports having her renal artery stented when she was around 83.   Given this history, there was concern for fibromuscular dysplasia.  She was referred for carotid ultrasound that revealed bilateral intimal thickening with 1-39% stenosis bilaterally but no plaque.   Melody Gardner underwent CT angiography of her whole body 03/2016.  She was found to have bilateral cervical ICA fibromuscular dysplasia.  Carotid ultrasound revealed 1-39% stenosis bilaterally.  She was evaluated by Dr. Scot Dock 07/04/16 and it was felt that no intervention was necessary. There was no FMD of the renal arteries.  She was also noted to have a 12 mm soft tissue nodule in the right neck suspicious for an ectopic parathyroid adenoma and dental caries.  At her last appointment her blood pressure was elevated so nebivolol was increased.  She has been feeling well.  She notes occasional episodes of chest pain that occur at rest.  It was not responsive to a PPI but Gas-X helps.  She has been exercising at the gym 3 days per week and denies exertional symptoms. She has not noted any  shortness of breath, lower extremity edema, orthopnea, or PND.  She does note some dizziness when she stands up quickly. Her only complaint right now is stressed that is attributable to life stressors.  Her husband has had difficulty keeping a job and she has been working a lot to compensate.   Melody Gardner has been working on her diet.  She no longer cooks with oil and has been using Melody Gardner and avoiding salt.     Past Medical History:  Diagnosis Date  . Acute MI, lateral wall, initial episode of care ([place]) 12/18/2015  . Anemia 2013  . Crohn's disease ([place])   . Depression    pt states she is depressed but not medically diagnosed  . Hypertension 2012  . Hypertensive heart disease 12/19/2015    Past Surgical History:  Procedure Laterality Date  . CARDIAC CATHETERIZATION N/A 12/18/2015   Procedure: Left Heart Cath and Coronary Angiography;  Surgeon: Sherren Mocha, MD;  Location: [facility];  Service: Cardiovascular;  Laterality: N/A;  . ENDOMETRIAL ABLATION  2013  . LAPAROSCOPY FOR ECTOPIC PREGNANCY    . RENAL ARTERY STENT    . TUBAL LIGATION  1989     Current Outpatient Prescriptions  Medication Sig Dispense Refill  . aspirin EC 81 MG tablet Take 1 tablet (81 mg total) by mouth daily. 30 tablet 12  . azaTHIOprine (IMURAN) 50 MG tablet Take 50 mg by mouth 3 (three) times daily.    . balsalazide (COLAZAL)  750 MG capsule Take 2,250 mg by mouth 3 (three) times daily.    Marland Kitchen losartan (COZAAR) 100 MG tablet Take 1 tablet (100 mg total) by mouth daily. 90 tablet 1  . Multiple Vitamins-Minerals (WOMENS ONE DAILY PO) Take 1 tablet by mouth daily.    . nebivolol (BYSTOLIC) 5 MG tablet Take 1 tablet (5 mg total) by mouth daily. 90 tablet 3  . nitroGLYCERIN (NITROSTAT) 0.4 MG SL tablet Place 1 tablet (0.4 mg total) under the tongue every 5 (five) minutes as needed for chest pain. 25 tablet 1  . traMADol (ULTRAM) 50 MG tablet Take by mouth every 6 (six) hours as needed.     No current  facility-administered medications for this visit.     Allergies:   Folic acid; Sulfa antibiotics; and Sulfasalazine    Social History:  The patient  reports that she has never smoked. She has never used smokeless tobacco. She reports that she does not drink alcohol or use drugs.   Family History:  The patient's family history includes Cancer in her brother; Diabetes in her brother and father; Hyperlipidemia in her father; Hypertension in her mother; Neuropathy in her brother and father; Stroke in her father.    ROS:  Please see the history of present illness.   Otherwise, review of systems are positive for none.   All other systems are reviewed and negative.    PHYSICAL EXAM: VS:  BP (!) 146/94   Pulse 70   Ht 5' 6"  (1.676 m)   Wt 77.9 kg (171 lb 12.8 oz)   BMI 27.73 kg/m  , BMI Body mass index is 27.73 kg/m. GENERAL:  Well appearing HEENT:  Pupils equal round and reactive, fundi not visualized, oral mucosa unremarkable NECK:  No jugular venous distention, waveform within normal limits, carotid upstroke brisk and symmetric, no bruits, no thyromegaly LYMPHATICS:  No cervical adenopathy LUNGS:  Clear to auscultation bilaterally HEART:  RRR.  PMI not displaced or sustained,S1 and S2 within normal limits, no S3, no S4, no clicks, no rubs, no murmurs ABD:  Flat, positive bowel sounds normal in frequency in pitch, no bruits, no rebound, no guarding, no midline pulsatile mass, no hepatomegaly, no splenomegaly EXT:  2+ radial pulses bilaterally.  1+ L DP/TP. 2+ R DP/TP.  No edema, no cyanosis no clubbing SKIN:  No rashes no nodules NEURO:  Cranial nerves II through XII grossly intact, motor grossly intact throughout PSYCH:  Cognitively intact, oriented to person place and time   EKG:  EKG is ordered today. 04/26/16: Sinus rhythm rate 79 bpm.  LVH  LHC 12/18/15: 100% D1 due to spontaneous dissection.  No other coronary artery disease.  Echo 12/19/15: Study Conclusions  - Left  ventricle: The cavity size was normal. Wall thickness was  normal. Systolic function was normal. The estimated ejection  fraction was in the range of 60% to 65%. Mildly abnormal GLPSS at  -17%, more regional inferior strain. Wall motion was normal;  there were no regional wall motion abnormalities. Doppler  parameters are consistent with abnormal left ventricular  relaxation (grade 1 diastolic dysfunction). The E/e&' ratio is  between 8-15, suggesting indeterminate LV filling pressure. - Left atrium: The atrium was normal in size. - Tricuspid valve: There was no significant regurgitation. - Inferior vena cava: The vessel was normal in size. The  respirophasic diameter changes were in the normal range (= 50%),  consistent with normal central venous pressure.  Impressions:  - LVEF 60-65%, normal wall thickness,  normal wall motion, mildly  abnormal GLPSS at -17% (inferior), Grade 1 DD with indeterminate  LV filling pressure, normal chamber sizes, normal IVC.  Carotid Doppler 01/12/16: Intimal thickening bilaterally.  1-39% ICA stenosis bilaterally.    Recent Labs: 06/12/2016: ALT 9; BUN 12; Creatinine, Ser 0.76; Hemoglobin 13.3; Platelets 395.0; Potassium 4.5; Sodium 140; TSH 1.23    Lipid Panel    Component Value Date/Time   CHOL 124 06/12/2016 1442   TRIG 53.0 06/12/2016 1442   HDL 44.70 06/12/2016 1442   CHOLHDL 3 06/12/2016 1442   VLDL 10.6 06/12/2016 1442   LDLCALC 69 06/12/2016 1442      Wt Readings from Last 3 Encounters:  07/24/16 77.9 kg (171 lb 12.8 oz)  07/04/16 75.8 kg (167 lb)  06/12/16 76.1 kg (167 lb 12.8 oz)      ASSESSMENT AND PLAN:  # Spontaneous coronary artery dissection:  # Fibromuscular dysplasia: # ICA stenosis: Ms. Bianchini had SCAD of D1 and has bilateral ICA stenosis.  She is currently asymptomatic from the ICA stenosis.  The stenosis likely is not amenable to surgical intervention. Appreciate vascular surgery consultation.  Renal  arteries were patent, though she reports previously placed renal artery stents.  Continue aspirin and nebivolol.  We will check a bmp in 1 week.  # Hypertension:  Blood pressure better remains above goal but remains above goal. Increase losartan to 100 mg daily and continue nebivolol.    Current medicines are reviewed at length with the patient today.  The patient does not have concerns regarding medicines.  The following changes have been made:  Increase nebivolol to 5 mg daily.  Labs/ tests ordered today include:   No orders of the defined types were placed in this encounter.    Disposition:   FU with Dorean Hiebert C. Oval Linsey, MD, [facility] in 1 year.   This note was written with the assistance of speech recognition software.  Please excuse any transcriptional errors.  Signed, Deretha Ertle C. Oval Linsey, MD, [facility]  07/24/2016 8:55 AM    [place]

## 2016-07-24 ENCOUNTER — Ambulatory Visit (INDEPENDENT_AMBULATORY_CARE_PROVIDER_SITE_OTHER): Payer: BLUE CROSS/BLUE SHIELD | Admitting: Cardiovascular Disease

## 2016-07-24 ENCOUNTER — Encounter: Payer: Self-pay | Admitting: Cardiovascular Disease

## 2016-07-24 VITALS — BP 146/94 | HR 70 | Ht 66.0 in | Wt 171.8 lb

## 2016-07-24 DIAGNOSIS — I10 Essential (primary) hypertension: Secondary | ICD-10-CM | POA: Diagnosis not present

## 2016-07-24 DIAGNOSIS — I773 Arterial fibromuscular dysplasia: Secondary | ICD-10-CM

## 2016-07-24 DIAGNOSIS — R42 Dizziness and giddiness: Secondary | ICD-10-CM | POA: Diagnosis not present

## 2016-07-24 DIAGNOSIS — Z79899 Other long term (current) drug therapy: Secondary | ICD-10-CM

## 2016-07-24 MED ORDER — LOSARTAN POTASSIUM 100 MG PO TABS: 100.0000 mg | ORAL_TABLET | Freq: Every day | ORAL | 3 refills | Status: DC

## 2016-07-24 NOTE — Patient Instructions (Addendum)
Medication Instructions:  INCREASE YOUR LOSARTAN TO 100 MG DAILY Labwork: BMET IN 1-2 WEEKS AT [facility] ON THE FIRST FLOOR   Testing/Procedures: NONE  Follow-Up: Your physician wants you to follow-up in: [place] will receive a reminder letter in the mail two months in advance. If you don't receive a letter, please call our office to schedule the follow-up appointment.  If you need a refill on your cardiac medications before your next appointment, please call your pharmacy.

## 2016-08-07 DIAGNOSIS — Z79899 Other long term (current) drug therapy: Secondary | ICD-10-CM | POA: Diagnosis not present

## 2016-08-08 LAB — BASIC METABOLIC PANEL
BUN: 15 mg/dL (ref 7–25)
CALCIUM: 9.5 mg/dL (ref 8.6–10.4)
CO2: 28 mmol/L (ref 20–31)
CREATININE: 0.92 mg/dL (ref 0.50–1.05)
Chloride: 107 mmol/L (ref 98–110)
GLUCOSE: 83 mg/dL (ref 65–99)
Potassium: 4.6 mmol/L (ref 3.5–5.3)
Sodium: 140 mmol/L (ref 135–146)

## 2016-08-17 DIAGNOSIS — R7989 Other specified abnormal findings of blood chemistry: Secondary | ICD-10-CM | POA: Diagnosis not present

## 2016-08-17 DIAGNOSIS — Z79899 Other long term (current) drug therapy: Secondary | ICD-10-CM | POA: Diagnosis not present

## 2016-08-31 DIAGNOSIS — K648 Other hemorrhoids: Secondary | ICD-10-CM | POA: Diagnosis not present

## 2016-08-31 DIAGNOSIS — K5080 Crohn's disease of both small and large intestine without complications: Secondary | ICD-10-CM | POA: Diagnosis not present

## 2016-08-31 DIAGNOSIS — K5010 Crohn's disease of large intestine without complications: Secondary | ICD-10-CM | POA: Diagnosis not present

## 2016-08-31 DIAGNOSIS — K633 Ulcer of intestine: Secondary | ICD-10-CM | POA: Diagnosis not present

## 2016-08-31 DIAGNOSIS — K529 Noninfective gastroenteritis and colitis, unspecified: Secondary | ICD-10-CM | POA: Diagnosis not present

## 2016-10-24 DIAGNOSIS — K5080 Crohn's disease of both small and large intestine without complications: Secondary | ICD-10-CM | POA: Diagnosis not present

## 2016-11-24 ENCOUNTER — Other Ambulatory Visit: Payer: Self-pay | Admitting: Cardiovascular Disease

## 2016-11-26 NOTE — Telephone Encounter (Signed)
Rx(s) sent to pharmacy electronically.  

## 2016-11-26 NOTE — Telephone Encounter (Signed)
Please review for refill. Thanks!  

## 2016-12-13 ENCOUNTER — Other Ambulatory Visit (HOSPITAL_COMMUNITY)
Admission: RE | Admit: 2016-12-13 | Discharge: 2016-12-13 | Disposition: A | Discharge status: Home / Self Care | Payer: BLUE CROSS/BLUE SHIELD | Source: Ambulatory Visit | Attending: Family Medicine | Admitting: Family Medicine

## 2016-12-13 ENCOUNTER — Encounter: Payer: Self-pay | Admitting: Family Medicine

## 2016-12-13 ENCOUNTER — Ambulatory Visit (INDEPENDENT_AMBULATORY_CARE_PROVIDER_SITE_OTHER): Payer: BLUE CROSS/BLUE SHIELD | Admitting: Family Medicine

## 2016-12-13 VITALS — BP 136/86 | HR 74 | Temp 98.5°F | Resp 16 | Ht 66.0 in | Wt 170.0 lb

## 2016-12-13 DIAGNOSIS — Z01419 Encounter for gynecological examination (general) (routine) without abnormal findings: Secondary | ICD-10-CM | POA: Diagnosis not present

## 2016-12-13 DIAGNOSIS — Z1151 Encounter for screening for human papillomavirus (HPV): Secondary | ICD-10-CM | POA: Diagnosis not present

## 2016-12-13 NOTE — Progress Notes (Signed)
Patient ID: Melody Gardner, female   DOB: 1965/11/05, 51 y.o.   MRN: [id number]     Subjective:  I acted as a Education administrator for Dr. Carollee Herter.  Melody Gardner, [place]   Patient ID: Melody Gardner, female    DOB: 09-Jul-1966, 51 y.o.   MRN: [id number]  Chief Complaint  Patient presents with  . repeat pap smear    HPI  Patient is in today for repeat pap smear.  Last pap there was not enough cells.  Patient Care Team: Ann Held, DO as PCP - General (Family Medicine) Wynona Neat. Shana Chute, MD as Referring Physician (Gastroenterology) Skeet Latch, MD as Attending Physician (Cardiology) Renato Shin, MD as Consulting Physician (Endocrinology)   Past Medical History:  Diagnosis Date  . Acute MI, lateral wall, initial episode of care ([facility]) 12/18/2015  . Anemia 2013  . Crohn's disease ([facility])   . Depression    pt states she is depressed but not medically diagnosed  . Hypertension 2012  . Hypertensive heart disease 12/19/2015    Past Surgical History:  Procedure Laterality Date  . CARDIAC CATHETERIZATION N/A 12/18/2015   Procedure: Left Heart Cath and Coronary Angiography;  Surgeon: Sherren Mocha, MD;  Location: [facility];  Service: Cardiovascular;  Laterality: N/A;  . ENDOMETRIAL ABLATION  2013  . LAPAROSCOPY FOR ECTOPIC PREGNANCY    . RENAL ARTERY STENT    . TUBAL LIGATION  1989    Family History  Problem Relation Age of Onset  . Hypertension Mother   . Cancer Brother   . Diabetes Brother   . Diabetes Father   . Stroke Father   . Neuropathy Father   . Hyperlipidemia Father   . Neuropathy Brother   . Thyroid disease Neg Hx     Social History   Social History  . Marital status: Married    Spouse name: N/A  . Number of children: N/A  . Years of education: N/A   Occupational History  .      [employer]   Social History Main Topics  . Smoking status: Never Smoker  . Smokeless tobacco: Never Used  . Alcohol use No  . Drug use: No  . Sexual activity: Yes   Birth control/ protection: Surgical   Other Topics Concern  . Not on file   Social History Narrative  . No narrative on file    Outpatient Medications Prior to Visit  Medication Sig Dispense Refill  . aspirin EC 81 MG tablet Take 1 tablet (81 mg total) by mouth daily. 30 tablet 12  . azaTHIOprine (IMURAN) 50 MG tablet Take 50 mg by mouth 3 (three) times daily.    . balsalazide (COLAZAL) 750 MG capsule Take 2,250 mg by mouth 3 (three) times daily.    Marland Kitchen losartan (COZAAR) 100 MG tablet Take 1 tablet (100 mg total) by mouth daily. 90 tablet 3  . Multiple Vitamins-Minerals (WOMENS ONE DAILY PO) Take 1 tablet by mouth daily.    . nebivolol (BYSTOLIC) 5 MG tablet Take 1 tablet (5 mg total) by mouth daily. 90 tablet 3  . nitroGLYCERIN (NITROSTAT) 0.4 MG SL tablet Place 1 tablet (0.4 mg total) under the tongue every 5 (five) minutes as needed for chest pain. 25 tablet 1  . traMADol (ULTRAM) 50 MG tablet Take by mouth every 6 (six) hours as needed.    Marland Kitchen BYSTOLIC 2.5 MG tablet TAKE 1 TABLET(2.5 MG) BY MOUTH DAILY 30 tablet 7   No facility-administered medications prior to visit.  Allergies  Allergen Reactions  . Folic Acid Rash    Pt declines this allergy  . Sulfa Antibiotics Rash  . Sulfasalazine Itching and Rash    Review of Systems  Constitutional: Negative for fever and malaise/fatigue.  HENT: Negative for congestion.   Eyes: Negative for blurred vision.  Respiratory: Negative for cough and shortness of breath.   Cardiovascular: Negative for chest pain, palpitations and leg swelling.  Gastrointestinal: Negative for vomiting.  Musculoskeletal: Negative for back pain.  Skin: Negative for rash.  Neurological: Negative for loss of consciousness and headaches.       Objective:    Physical Exam  Constitutional: She is oriented to person, place, and time. She appears well-developed and well-nourished. No distress.  HENT:  Head: Normocephalic and atraumatic.  Eyes: Conjunctivae  are normal.  Neck: Normal range of motion.  Abdominal: Soft. Bowel sounds are normal. There is no tenderness.  Genitourinary:    No labial fusion. There is no rash, tenderness, lesion or injury on the right labia. There is no rash, tenderness, lesion or injury on the left labia. Uterus is not deviated, not enlarged, not fixed and not tender. Cervix exhibits discharge. Cervix exhibits no motion tenderness and no friability. No erythema, tenderness or bleeding in the vagina. No foreign body in the vagina. No signs of injury around the vagina. No vaginal discharge found.  Musculoskeletal: Normal range of motion.  Neurological: She is alert and oriented to person, place, and time.  Skin: Skin is warm and dry. She is not diaphoretic.  Psychiatric: She has a normal mood and affect.  Nursing note and vitals reviewed.   BP 136/86 (Cuff Size: Normal)   Pulse 74   Temp 98.5 F (36.9 C) (Oral)   Resp 16   Ht 5' 6"  (1.676 m)   Wt 170 lb (77.1 kg)   LMP 12/08/2016   SpO2 99%   BMI 27.44 kg/m      Immunization History  Administered Date(s) Administered  . Tdap 01/06/2014    Health Maintenance  Topic Date Due  . INFLUENZA VACCINE  06/12/2017 (Originally 05/01/2016)  . HIV Screening  06/12/2017 (Originally 01/18/1981)  . MAMMOGRAM  06/12/2018  . PAP SMEAR  06/13/2019  . TETANUS/TDAP  01/07/2024  . COLONOSCOPY  09/22/2025    Lab Results  Component Value Date   WBC 6.1 06/12/2016   HGB 13.3 06/12/2016   HCT 39.3 06/12/2016   PLT 395.0 06/12/2016   GLUCOSE 83 08/07/2016   CHOL 124 06/12/2016   TRIG 53.0 06/12/2016   HDL 44.70 06/12/2016   LDLCALC 69 06/12/2016   ALT 9 06/12/2016   AST 18 06/12/2016   NA 140 08/07/2016   K 4.6 08/07/2016   CL 107 08/07/2016   CREATININE 0.92 08/07/2016   BUN 15 08/07/2016   CO2 28 08/07/2016   TSH 1.23 06/12/2016    Lab Results  Component Value Date   TSH 1.23 06/12/2016   Lab Results  Component Value Date   WBC 6.1 06/12/2016   HGB  13.3 06/12/2016   HCT 39.3 06/12/2016   MCV 89.6 06/12/2016   PLT 395.0 06/12/2016   Lab Results  Component Value Date   NA 140 08/07/2016   K 4.6 08/07/2016   CO2 28 08/07/2016   GLUCOSE 83 08/07/2016   BUN 15 08/07/2016   CREATININE 0.92 08/07/2016   BILITOT 1.3 (H) 06/12/2016   ALKPHOS 58 06/12/2016   AST 18 06/12/2016   ALT 9 06/12/2016   PROT 7.6  06/12/2016   ALBUMIN 4.1 06/12/2016   CALCIUM 9.5 08/07/2016   ANIONGAP 4 (L) 02/06/2016   GFR 103.43 06/12/2016   Lab Results  Component Value Date   CHOL 124 06/12/2016   Lab Results  Component Value Date   HDL 44.70 06/12/2016   Lab Results  Component Value Date   LDLCALC 69 06/12/2016   Lab Results  Component Value Date   TRIG 53.0 06/12/2016   Lab Results  Component Value Date   CHOLHDL 3 06/12/2016   No results found for: HGBA1C       Assessment & Plan:   Problem List Items Addressed This Visit    None    Visit Diagnoses    Visit for gynecologic examination    -  Primary   Relevant Orders   Cytology - PAP      I have discontinued Ms. Mckiver's BYSTOLIC. I am also having her maintain her balsalazide, nitroGLYCERIN, aspirin EC, Multiple Vitamins-Minerals (WOMENS ONE DAILY PO), azaTHIOprine, nebivolol, traMADol, and losartan.  No orders of the defined types were placed in this encounter.   CMA served as Education administrator during this visit. History, Physical and Plan performed by medical provider. Documentation and orders reviewed and attested to.  Ann Held, DO

## 2016-12-13 NOTE — Patient Instructions (Signed)
DASH Eating Plan DASH stands for "Dietary Approaches to Stop Hypertension." The DASH eating plan is a healthy eating plan that has been shown to reduce high blood pressure (hypertension). It may also reduce your risk for type 2 diabetes, heart disease, and stroke. The DASH eating plan may also help with weight loss. What are tips for following this plan? General guidelines  Avoid eating more than 2,300 mg (milligrams) of salt (sodium) a day. If you have hypertension, you may need to reduce your sodium intake to 1,500 mg a day.  Limit alcohol intake to no more than 1 drink a day for nonpregnant women and 2 drinks a day for men. One drink equals 12 oz of beer, 5 oz of wine, or 1 oz of hard liquor.  Work with your health care provider to maintain a healthy body weight or to lose weight. Ask what an ideal weight is for you.  Get at least 30 minutes of exercise that causes your heart to beat faster (aerobic exercise) most days of the week. Activities may include walking, swimming, or biking.  Work with your health care provider or diet and nutrition specialist (dietitian) to adjust your eating plan to your individual calorie needs. Reading food labels  Check food labels for the amount of sodium per serving. Choose foods with less than 5 percent of the Daily Value of sodium. Generally, foods with less than 300 mg of sodium per serving fit into this eating plan.  To find whole grains, look for the word "whole" as the first word in the ingredient list. Shopping  Buy products labeled as "low-sodium" or "no salt added."  Buy fresh foods. Avoid canned foods and premade or frozen meals. Cooking  Avoid adding salt when cooking. Use salt-free seasonings or herbs instead of table salt or sea salt. Check with your health care provider or pharmacist before using salt substitutes.  Do not fry foods. Cook foods using healthy methods such as baking, boiling, grilling, and broiling instead.  Cook with  heart-healthy oils, such as olive, canola, soybean, or sunflower oil. Meal planning   Eat a balanced diet that includes: ? 5 or more servings of fruits and vegetables each day. At each meal, try to fill half of your plate with fruits and vegetables. ? Up to 6-8 servings of whole grains each day. ? Less than 6 oz of lean meat, poultry, or fish each day. A 3-oz serving of meat is about the same size as a deck of cards. One egg equals 1 oz. ? 2 servings of low-fat dairy each day. ? A serving of nuts, seeds, or beans 5 times each week. ? Heart-healthy fats. Healthy fats called Omega-3 fatty acids are found in foods such as flaxseeds and coldwater fish, like sardines, salmon, and mackerel.  Limit how much you eat of the following: ? Canned or prepackaged foods. ? Food that is high in trans fat, such as fried foods. ? Food that is high in saturated fat, such as fatty meat. ? Sweets, desserts, sugary drinks, and other foods with added sugar. ? Full-fat dairy products.  Do not salt foods before eating.  Try to eat at least 2 vegetarian meals each week.  Eat more home-cooked food and less restaurant, buffet, and fast food.  When eating at a restaurant, ask that your food be prepared with less salt or no salt, if possible. What foods are recommended? The items listed may not be a complete list. Talk with your dietitian about what   dietary choices are best for you. Grains Whole-grain or whole-wheat bread. Whole-grain or whole-wheat pasta. Melody Gardner. Oatmeal. Quinoa. Bulgur. Whole-grain and low-sodium cereals. Pita bread. Low-fat, low-sodium crackers. Whole-wheat flour tortillas. Vegetables Fresh or frozen vegetables (raw, steamed, roasted, or grilled). Low-sodium or reduced-sodium tomato and vegetable juice. Low-sodium or reduced-sodium tomato sauce and tomato paste. Low-sodium or reduced-sodium canned vegetables. Fruits All fresh, dried, or frozen fruit. Canned fruit in natural juice (without  added sugar). Meat and other protein foods Skinless chicken or turkey. Ground chicken or turkey. Pork with fat trimmed off. Fish and seafood. Egg whites. Dried beans, peas, or lentils. Unsalted nuts, nut butters, and seeds. Unsalted canned beans. Lean cuts of beef with fat trimmed off. Low-sodium, lean deli meat. Dairy Low-fat (1%) or fat-free (skim) milk. Fat-free, low-fat, or reduced-fat cheeses. Nonfat, low-sodium ricotta or cottage cheese. Low-fat or nonfat yogurt. Low-fat, low-sodium cheese. Fats and oils Soft margarine without trans fats. Vegetable oil. Low-fat, reduced-fat, or light mayonnaise and salad dressings (reduced-sodium). Canola, safflower, olive, soybean, and sunflower oils. Avocado. Seasoning and other foods Herbs. Spices. Seasoning mixes without salt. Unsalted popcorn and pretzels. Fat-free sweets. What foods are not recommended? The items listed may not be a complete list. Talk with your dietitian about what dietary choices are best for you. Grains Baked goods made with fat, such as croissants, muffins, or some breads. Dry pasta or Gardner meal packs. Vegetables Creamed or fried vegetables. Vegetables in a cheese sauce. Regular canned vegetables (not low-sodium or reduced-sodium). Regular canned tomato sauce and paste (not low-sodium or reduced-sodium). Regular tomato and vegetable juice (not low-sodium or reduced-sodium). Pickles. Olives. Fruits Canned fruit in a light or heavy syrup. Fried fruit. Fruit in cream or butter sauce. Meat and other protein foods Fatty cuts of meat. Ribs. Fried meat. Bacon. Sausage. Bologna and other processed lunch meats. Salami. Fatback. Hotdogs. Bratwurst. Salted nuts and seeds. Canned beans with added salt. Canned or smoked fish. Whole eggs or egg yolks. Chicken or turkey with skin. Dairy Whole or 2% milk, cream, and half-and-half. Whole or full-fat cream cheese. Whole-fat or sweetened yogurt. Full-fat cheese. Nondairy creamers. Whipped toppings.  Processed cheese and cheese spreads. Fats and oils Butter. Stick margarine. Lard. Shortening. Ghee. Bacon fat. Tropical oils, such as coconut, palm kernel, or palm oil. Seasoning and other foods Salted popcorn and pretzels. Onion salt, garlic salt, seasoned salt, table salt, and sea salt. Worcestershire sauce. Tartar sauce. Barbecue sauce. Teriyaki sauce. Soy sauce, including reduced-sodium. Steak sauce. Canned and packaged gravies. Fish sauce. Oyster sauce. Cocktail sauce. Horseradish that you find on the shelf. Ketchup. Mustard. Meat flavorings and tenderizers. Bouillon cubes. Hot sauce and Tabasco sauce. Premade or packaged marinades. Premade or packaged taco seasonings. Relishes. Regular salad dressings. Where to find more information:  National Heart, Lung, and Blood Institute: www.nhlbi.nih.gov  American Heart Association: www.heart.org Summary  The DASH eating plan is a healthy eating plan that has been shown to reduce high blood pressure (hypertension). It may also reduce your risk for type 2 diabetes, heart disease, and stroke.  With the DASH eating plan, you should limit salt (sodium) intake to 2,300 mg a day. If you have hypertension, you may need to reduce your sodium intake to 1,500 mg a day.  When on the DASH eating plan, aim to eat more fresh fruits and vegetables, whole grains, lean proteins, low-fat dairy, and heart-healthy fats.  Work with your health care provider or diet and nutrition specialist (dietitian) to adjust your eating plan to your individual   calorie needs. This information is not intended to replace advice given to you by your health care provider. Make sure you discuss any questions you have with your health care provider. Document Released: 09/06/2011 Document Revised: 09/10/2016 Document Reviewed: 09/10/2016 Elsevier Interactive Patient Education  2017 Elsevier Inc.  

## 2016-12-13 NOTE — Progress Notes (Signed)
Pre visit review using our clinic review tool, if applicable. No additional management support is needed unless otherwise documented below in the visit note. 

## 2016-12-17 LAB — CYTOLOGY - PAP
ADEQUACY: ABSENT
DIAGNOSIS: NEGATIVE
HPV (WINDOPATH): DETECTED — AB

## 2016-12-18 ENCOUNTER — Other Ambulatory Visit: Payer: Self-pay | Admitting: Family Medicine

## 2016-12-18 MED ORDER — METRONIDAZOLE 0.75 % VA GEL: 1.0000 | Freq: Every day | VAGINAL | 0 refills | Status: DC

## 2016-12-27 DIAGNOSIS — Z79899 Other long term (current) drug therapy: Secondary | ICD-10-CM | POA: Diagnosis not present

## 2016-12-27 DIAGNOSIS — R7989 Other specified abnormal findings of blood chemistry: Secondary | ICD-10-CM | POA: Diagnosis not present

## 2017-01-10 ENCOUNTER — Telehealth: Payer: Self-pay | Admitting: Family Medicine

## 2017-01-10 NOTE — Telephone Encounter (Signed)
°  Relation to VY:XAJL Call back number:[phone]   Reason for call:  Patient scheduled TB with the nurse for 01/11/17.

## 2017-01-10 NOTE — Telephone Encounter (Signed)
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

## 2017-01-10 NOTE — Telephone Encounter (Signed)
I can schedule just let me know if ok to do since PCP out of the office.

## 2017-01-10 NOTE — Telephone Encounter (Signed)
OK to schedule for TB test.

## 2017-01-10 NOTE — Telephone Encounter (Signed)
Called to inquire why she needed a TB skin test---left message to call back.

## 2017-01-11 ENCOUNTER — Ambulatory Visit (INDEPENDENT_AMBULATORY_CARE_PROVIDER_SITE_OTHER): Payer: BLUE CROSS/BLUE SHIELD | Admitting: Behavioral Health

## 2017-01-11 DIAGNOSIS — Z23 Encounter for immunization: Secondary | ICD-10-CM

## 2017-01-11 NOTE — Progress Notes (Signed)
Pre visit review using our clinic review tool, if applicable. No additional management support is needed unless otherwise documented below in the visit note.  Patient in clinic for PPD placement. Intradermal injection given in left arm. Patient tolerated it well. Advised patient to return to the office in 72 hours to have TB skin test read. She understood and did not have any questions or concerns before leaving the visit.

## 2017-01-14 ENCOUNTER — Encounter: Payer: Self-pay | Admitting: Behavioral Health

## 2017-01-14 LAB — TB SKIN TEST
INDURATION: 0 mm
TB Skin Test: NEGATIVE

## 2017-02-01 DIAGNOSIS — R748 Abnormal levels of other serum enzymes: Secondary | ICD-10-CM | POA: Diagnosis not present

## 2017-03-15 IMAGING — CT CT ANGIO NECK
1 of 22 series · 3 of 34 positions shown · IV contrast (Iohexol (Omnipaque 350))
Comparison: Chest CTA from today is reported separately.

CLINICAL DATA: 50-year-old female with spontaneous coronary artery
dissection, decreased pulse in the right arm. Subsequent encounter.

EXAM:
CT ANGIOGRAPHY HEAD AND NECK
TECHNIQUE: Multidetector CT imaging of the head and neck was performed using
the standard protocol during bolus administration of intravenous
contrast. Multiplanar CT image reconstructions and MIPs were
obtained to evaluate the vascular anatomy. Carotid stenosis
measurements (when applicable) are obtained utilizing NASCET
criteria, using the distal internal carotid diameter as the
denominator.
CONTRAST:  100 mL Isovue 370 in conjunction with contrast enhanced
imaging of the chest reported separately.

[Series 5011: 1x1 axial · axial · 0.45mm/px · z∈[-61,+109]mm · 3 of 341 slices shown]
[im 86/341  soft-tissue]
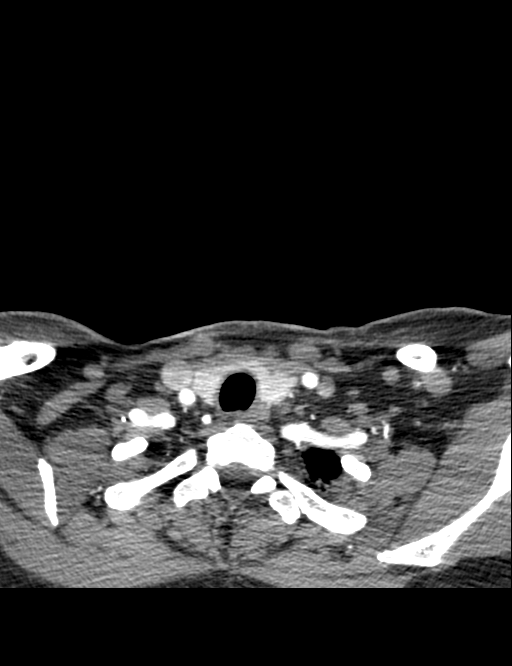
[im 171/341  bone]
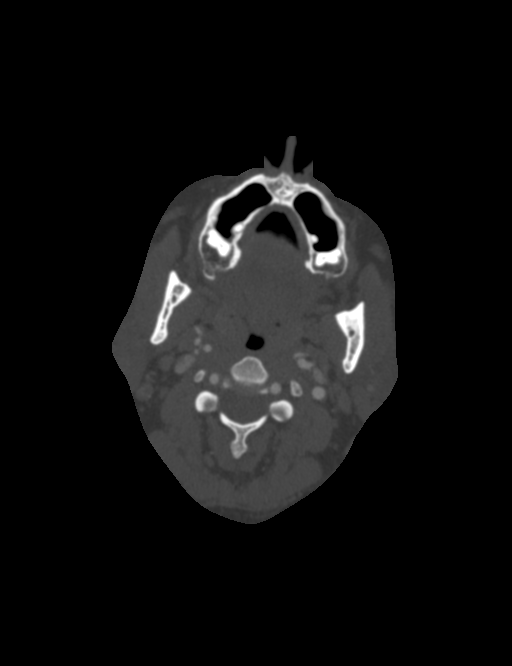
[im 256/341  soft-tissue]
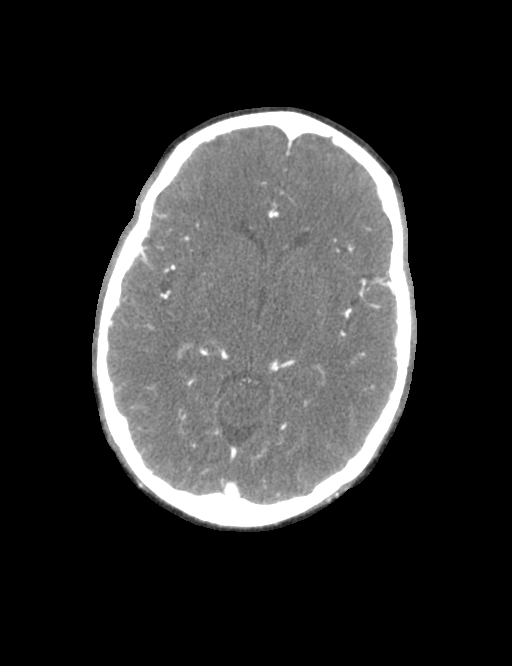

[3 of 34 positions shown; findings below may reference images not displayed]

FINDINGS: CT HEAD

Brain: Cerebral volume is within normal limits. No midline shift,
ventriculomegaly, mass effect, evidence of mass lesion, intracranial
hemorrhage or evidence of cortically based acute infarction.
Gray-white matter differentiation is within normal limits throughout
the brain.

Calvarium and skull base: Negative.

Paranasal sinuses: Clear.

Orbits: Negative.  Negative scalp soft tissues.

CTA NECK

Skeleton: Advanced chronic C5-C6 disc and endplate degeneration with
no other cervical spine degeneration. Scattered poor dentition with
large areas of dental caries and intermittent periapical lucencies.
No acute osseous abnormality identified.

Other neck: Chest findings today are reported separately.

Small hyperenhancing are calcified left thyroid nodule does not meet
size criteria for ultrasound follow-up. Larynx, pharynx (retained
secretions in the left vallecula), parapharyngeal spaces,
retropharyngeal space, sublingual space, submandibular glands, and
parotid glands are within normal limits. A small 12 mm oval soft
tissue nodule situated among normal right level 2 B lymph nodes
demonstrates a prominent hilar vessel and early enhancement
(sagittal image 27 of series 61882. Otherwise no cervical
lymphadenopathy. No cystic or necrotic nodes.

Aortic arch: 3 vessel arch configuration. Chest CTA findings
reported separately. No arch atherosclerosis or great vessel origin
stenosis.

Right carotid system: Negative right CCA and right carotid
bifurcation. The right CCA distal to the bulb is somewhat diminutive
with a beaded appearance and tortuosity (series 9869, image 21).
Otherwise no cervical right ICA stenosis.

Left carotid system: Tortuous but otherwise negative left CCA.
Negative left carotid bifurcation. Similar to the right side, the
cervical ICA distal to the bulb is beaded and mildly tortuous
(series 1498, image 19). No associated left ICA stenosis in the
neck.

Vertebral arteries:No proximal subclavian artery stenosis. Normal
vertebral artery origins. Tortuous left V1 segment. Both vertebral
arteries appear normal to the skullbase.

CTA HEAD

Posterior circulation: Normal distal vertebral arteries and
vertebrobasilar junction. Normal PICA origins. Normal basilar
artery. Normal SCA and PCA origins. Both posterior communicating
arteries are present and appear normal. Bilateral PCA branches are
within normal limits.

Anterior circulation: Both ICA siphons are somewhat tortuous but
without atherosclerosis or stenosis. Ophthalmic and posterior
communicating artery origins are normal with a small right Pcomm
infundibulum suspected. Normal carotid termini. Normal MCA and ACA
origins. Normal anterior communicating artery. Bilateral ACA
branches are normal. Left MCA M1 segment, bifurcation, and left MCA
branches appear normal. Right MCA M1 segment, bifurcation, and right
MCA branches appear normal.

Venous sinuses: Patent.

Anatomic variants: None.

Delayed phase: No abnormal enhancement identified.
IMPRESSION: 1. Positive for bilateral cervical ICA fibromuscular dysplasia
(FMD). No associated carotid dissection or hemodynamically
significant stenosis.
2. Negative vertebral arteries.  Negative intracranial circulation.
3. Chest CTA findings are reported separately today.
4.  Normal CT appearance of the brain.
5. Nonspecific 12 mm oval soft tissue nodule in the right neck at
the level IIa nodal station with early enhancement. Such an
appearance can be seen with ectopic parathyroid adenoma. Query
endocrinopathy.
6. Scattered dental caries and poor dentition.

## 2017-03-22 DIAGNOSIS — R945 Abnormal results of liver function studies: Secondary | ICD-10-CM | POA: Diagnosis not present

## 2017-05-10 ENCOUNTER — Other Ambulatory Visit: Payer: Self-pay | Admitting: Cardiovascular Disease

## 2017-05-10 ENCOUNTER — Other Ambulatory Visit: Payer: Self-pay | Admitting: Medical

## 2017-05-10 DIAGNOSIS — I252 Old myocardial infarction: Secondary | ICD-10-CM | POA: Diagnosis not present

## 2017-05-10 DIAGNOSIS — G319 Degenerative disease of nervous system, unspecified: Secondary | ICD-10-CM | POA: Diagnosis not present

## 2017-05-10 DIAGNOSIS — R9082 White matter disease, unspecified: Secondary | ICD-10-CM | POA: Diagnosis not present

## 2017-05-10 DIAGNOSIS — I10 Essential (primary) hypertension: Secondary | ICD-10-CM | POA: Diagnosis not present

## 2017-05-10 DIAGNOSIS — R42 Dizziness and giddiness: Secondary | ICD-10-CM | POA: Diagnosis not present

## 2017-05-10 DIAGNOSIS — K5090 Crohn's disease, unspecified, without complications: Secondary | ICD-10-CM | POA: Diagnosis not present

## 2017-05-10 DIAGNOSIS — H538 Other visual disturbances: Secondary | ICD-10-CM | POA: Diagnosis not present

## 2017-05-10 NOTE — Telephone Encounter (Signed)
Please review for refill, thanks ! 

## 2017-05-13 DIAGNOSIS — K5080 Crohn's disease of both small and large intestine without complications: Secondary | ICD-10-CM | POA: Diagnosis not present

## 2017-06-14 ENCOUNTER — Encounter: Payer: Self-pay | Admitting: Family Medicine

## 2017-06-14 ENCOUNTER — Ambulatory Visit (INDEPENDENT_AMBULATORY_CARE_PROVIDER_SITE_OTHER): Payer: Self-pay | Admitting: Family Medicine

## 2017-06-14 VITALS — BP 106/78 | HR 81 | Temp 98.4°F | Ht 66.0 in | Wt 162.0 lb

## 2017-06-14 DIAGNOSIS — I119 Hypertensive heart disease without heart failure: Secondary | ICD-10-CM

## 2017-06-14 DIAGNOSIS — I2129 ST elevation (STEMI) myocardial infarction involving other sites: Secondary | ICD-10-CM

## 2017-06-14 DIAGNOSIS — Z0000 Encounter for general adult medical examination without abnormal findings: Secondary | ICD-10-CM

## 2017-06-14 DIAGNOSIS — D259 Leiomyoma of uterus, unspecified: Secondary | ICD-10-CM

## 2017-06-14 NOTE — Patient Instructions (Signed)
Preventive Care 40-64 Years, Female Preventive care refers to lifestyle choices and visits with your health care provider that can promote health and wellness. What does preventive care include?  A yearly physical exam. This is also called an annual well check.  Dental exams once or twice a year.  Routine eye exams. Ask your health care provider how often you should have your eyes checked.  Personal lifestyle choices, including: ? Daily care of your teeth and gums. ? Regular physical activity. ? Eating a healthy diet. ? Avoiding tobacco and drug use. ? Limiting alcohol use. ? Practicing safe sex. ? Taking low-dose aspirin daily starting at age 58. ? Taking vitamin and mineral supplements as recommended by your health care provider. What happens during an annual well check? The services and screenings done by your health care provider during your annual well check will depend on your age, overall health, lifestyle risk factors, and family history of disease. Counseling Your health care provider may ask you questions about your:  Alcohol use.  Tobacco use.  Drug use.  Emotional well-being.  Home and relationship well-being.  Sexual activity.  Eating habits.  Work and work Statistician.  Method of birth control.  Menstrual cycle.  Pregnancy history.  Screening You may have the following tests or measurements:  Height, weight, and BMI.  Blood pressure.  Lipid and cholesterol levels. These may be checked every 5 years, or more frequently if you are over 81 years old.  Skin check.  Lung cancer screening. You may have this screening every year starting at age 78 if you have a 30-pack-year history of smoking and currently smoke or have quit within the past 15 years.  Fecal occult blood test (FOBT) of the stool. You may have this test every year starting at age 65.  Flexible sigmoidoscopy or colonoscopy. You may have a sigmoidoscopy every 5 years or a colonoscopy  every 10 years starting at age 30.  Hepatitis C blood test.  Hepatitis B blood test.  Sexually transmitted disease (STD) testing.  Diabetes screening. This is done by checking your blood sugar (glucose) after you have not eaten for a while (fasting). You may have this done every 1-3 years.  Mammogram. This may be done every 1-2 years. Talk to your health care provider about when you should start having regular mammograms. This may depend on whether you have a family history of breast cancer.  BRCA-related cancer screening. This may be done if you have a family history of breast, ovarian, tubal, or peritoneal cancers.  Pelvic exam and Pap test. This may be done every 3 years starting at age 80. Starting at age 36, this may be done every 5 years if you have a Pap test in combination with an HPV test.  Bone density scan. This is done to screen for osteoporosis. You may have this scan if you are at high risk for osteoporosis.  Discuss your test results, treatment options, and if necessary, the need for more tests with your health care provider. Vaccines Your health care provider may recommend certain vaccines, such as:  Influenza vaccine. This is recommended every year.  Tetanus, diphtheria, and acellular pertussis (Tdap, Td) vaccine. You may need a Td booster every 10 years.  Varicella vaccine. You may need this if you have not been vaccinated.  Zoster vaccine. You may need this after age 5.  Measles, mumps, and rubella (MMR) vaccine. You may need at least one dose of MMR if you were born in  1957 or later. You may also need a second dose.  Pneumococcal 13-valent conjugate (PCV13) vaccine. You may need this if you have certain conditions and were not previously vaccinated.  Pneumococcal polysaccharide (PPSV23) vaccine. You may need one or two doses if you smoke cigarettes or if you have certain conditions.  Meningococcal vaccine. You may need this if you have certain  conditions.  Hepatitis A vaccine. You may need this if you have certain conditions or if you travel or work in places where you may be exposed to hepatitis A.  Hepatitis B vaccine. You may need this if you have certain conditions or if you travel or work in places where you may be exposed to hepatitis B.  Haemophilus influenzae type b (Hib) vaccine. You may need this if you have certain conditions.  Talk to your health care provider about which screenings and vaccines you need and how often you need them. This information is not intended to replace advice given to you by your health care provider. Make sure you discuss any questions you have with your health care provider. Document Released: 10/14/2015 Document Revised: 06/06/2016 Document Reviewed: 07/19/2015 Elsevier Interactive Patient Education  2017 [employer].

## 2017-06-14 NOTE — Assessment & Plan Note (Signed)
Per cardiology 

## 2017-06-14 NOTE — Assessment & Plan Note (Signed)
Per gyn 

## 2017-06-14 NOTE — Assessment & Plan Note (Signed)
F/u cardiology

## 2017-06-14 NOTE — Progress Notes (Signed)
Subjective:     Melody Gardner is a 51 y.o. female and is here for a comprehensive physical exam. The patient reports no problems.  Social History   Social History  . Marital status: Married    Spouse name: N/A  . Number of children: N/A  . Years of education: N/A   Occupational History  .      [employer]   Social History Main Topics  . Smoking status: Never Smoker  . Smokeless tobacco: Never Used  . Alcohol use No  . Drug use: No  . Sexual activity: Yes    Birth control/ protection: Surgical   Other Topics Concern  . Not on file   Social History Narrative  . No narrative on file   Health Maintenance  Topic Date Due  . HIV Screening  01/18/1981  . INFLUENZA VACCINE  12/29/2017 (Originally 05/01/2017)  . MAMMOGRAM  06/12/2018  . PAP SMEAR  12/14/2019  . TETANUS/TDAP  01/07/2024  . COLONOSCOPY  09/22/2025    The following portions of the patient's history were reviewed and updated as appropriate:  She  has a past medical history of Acute MI, lateral wall, initial episode of care ([place]) (12/18/2015); Anemia (2013); Crohn's disease ([place]); Depression; Hypertension (2012); and Hypertensive heart disease (12/19/2015). She  does not have any pertinent problems on file. She  has a past surgical history that includes Renal artery stent; Laparoscopy for ectopic pregnancy; Tubal ligation (1989); Endometrial ablation (2013); and Cardiac catheterization (N/A, 12/18/2015). Her family history includes Cancer in her brother; Diabetes in her brother and father; Hyperlipidemia in her father; Hypertension in her mother; Neuropathy in her brother and father; Stroke in her father. She  reports that she has never smoked. She has never used smokeless tobacco. She reports that she does not drink alcohol or use drugs. She has a current medication list which includes the following prescription(s): aspirin ec, azathioprine, balsalazide, bystolic, fluticasone, losartan, multiple vitamins-minerals,  nitroglycerin, and tramadol. Current Outpatient Prescriptions on File Prior to Visit  Medication Sig Dispense Refill  . aspirin EC 81 MG tablet Take 1 tablet (81 mg total) by mouth daily. 30 tablet 12  . azaTHIOprine (IMURAN) 50 MG tablet Take 50 mg by mouth 3 (three) times daily.    . balsalazide (COLAZAL) 750 MG capsule Take 2,250 mg by mouth 3 (three) times daily.    Marland Kitchen BYSTOLIC 5 MG tablet TAKE 1 TABLET(5 MG) BY MOUTH DAILY 90 tablet 0  . fluticasone (FLONASE) 50 MCG/ACT nasal spray SHAKE LIQUID AND USE 2 SPRAYS IN EACH NOSTRIL DAILY 16 g 1  . losartan (COZAAR) 100 MG tablet Take 1 tablet (100 mg total) by mouth daily. 90 tablet 3  . Multiple Vitamins-Minerals (WOMENS ONE DAILY PO) Take 1 tablet by mouth daily.    . nitroGLYCERIN (NITROSTAT) 0.4 MG SL tablet Place 1 tablet (0.4 mg total) under the tongue every 5 (five) minutes as needed for chest pain. 25 tablet 1  . traMADol (ULTRAM) 50 MG tablet Take by mouth every 6 (six) hours as needed.     No current facility-administered medications on file prior to visit.    She is allergic to folic acid; sulfa antibiotics; and sulfasalazine..  Review of Systems Review of Systems  Constitutional: Negative for activity change, appetite change and fatigue.  HENT: Negative for hearing loss, congestion, tinnitus and ear discharge.  dentist -due Eyes: Negative for visual disturbance (see optho--due).  Respiratory: Negative for cough, chest tightness and shortness of breath.  Cardiovascular: Negative for chest pain, palpitations and leg swelling.  Gastrointestinal: Negative for abdominal pain, diarrhea, constipation and abdominal distention.  Genitourinary: Negative for urgency, frequency, decreased urine volume and difficulty urinating.  Musculoskeletal: Negative for back pain, arthralgias and gait problem.  Skin: Negative for color change, pallor and rash.  Neurological: Negative for dizziness, light-headedness, numbness and headaches.   Hematological: Negative for adenopathy. Does not bruise/bleed easily.  Psychiatric/Behavioral: Negative for suicidal ideas, confusion, sleep disturbance, self-injury, dysphoric mood, decreased concentration and agitation.      Objective:    BP 106/78 (BP Location: Right Arm, Patient Position: Sitting, Cuff Size: Normal)   Pulse 81   Temp 98.4 F (36.9 C) (Oral)   Ht 5' 6"  (1.676 m)   Wt 162 lb (73.5 kg)   LMP 06/04/2017   SpO2 95%   BMI 26.15 kg/m  General appearance: alert, cooperative, appears stated age and no distress Head: Normocephalic, without obvious abnormality, atraumatic Eyes: negative findings: lids and lashes normal, conjunctivae and sclerae normal and pupils equal, round, reactive to light and accomodation Ears: normal TM's and external ear canals both ears Nose: Nares normal. Septum midline. Mucosa normal. No drainage or sinus tenderness. Throat: lips, mucosa, and tongue normal; teeth and gums normal Neck: no adenopathy, no carotid bruit, no JVD, supple, symmetrical, trachea midline and thyroid not enlarged, symmetric, no tenderness/mass/nodules Back: symmetric, no curvature. ROM normal. No CVA tenderness. Lungs: clear to auscultation bilaterally Breasts: gyn Heart: regular rate and rhythm, S1, S2 normal, no murmur, click, rub or gallop Abdomen: soft, non-tender; bowel sounds normal; no masses,  no organomegaly Pelvic: deferred--gyn Extremities: extremities normal, atraumatic, no cyanosis or edema Pulses: 2+ and symmetric Skin: Skin color, texture, turgor normal. No rashes or lesions Lymph nodes: Cervical, supraclavicular, and axillary nodes normal. Neurologic: Alert and oriented X 3, normal strength and tone. Normal symmetric reflexes. Normal coordination and gait    Assessment:    Healthy female exam.      Plan:    ghm utd Check labs See After Visit Summary for Counseling Recommendations    1. Preventative health care See above  - TSH; Future -  Lipid panel; Future - CBC with Differential/Platelet; Future - Comprehensive metabolic panel; Future - POCT Urinalysis Dipstick (Automated); Future  2. Hypertensive heart disease without heart failure Per cardiology  3. Uterine leiomyoma, unspecified location Per gyn  4. Acute MI, lateral wall, initial episode of care [facility]) Per cardiology

## 2017-06-21 ENCOUNTER — Ambulatory Visit: Payer: BLUE CROSS/BLUE SHIELD | Admitting: Family Medicine

## 2017-07-11 ENCOUNTER — Ambulatory Visit: Payer: Self-pay | Admitting: Family Medicine

## 2017-07-23 ENCOUNTER — Other Ambulatory Visit: Payer: Self-pay | Admitting: Cardiovascular Disease

## 2017-07-23 DIAGNOSIS — I10 Essential (primary) hypertension: Secondary | ICD-10-CM

## 2017-07-23 NOTE — Telephone Encounter (Signed)
Please review for refill. Thanks!  

## 2017-07-23 NOTE — Telephone Encounter (Signed)
REFIL

## 2017-07-29 ENCOUNTER — Ambulatory Visit: Payer: Self-pay | Admitting: Cardiovascular Disease

## 2017-08-15 ENCOUNTER — Other Ambulatory Visit: Payer: Self-pay | Admitting: Cardiovascular Disease

## 2017-08-16 ENCOUNTER — Telehealth: Payer: Self-pay | Admitting: Cardiovascular Disease

## 2017-08-16 MED ORDER — NEBIVOLOL HCL 5 MG PO TABS: ORAL_TABLET | ORAL | 0 refills | Status: DC

## 2017-08-16 NOTE — Telephone Encounter (Signed)
Rx(s) sent to pharmacy electronically.  

## 2017-08-16 NOTE — Telephone Encounter (Signed)
Patient calling, states that she needs Dr. Blenda Mounts approval to get her bystolic 2.5 mg refilled. Patient only has two pills.

## 2017-08-16 NOTE — Telephone Encounter (Signed)
Refill Request.  

## 2017-10-03 ENCOUNTER — Other Ambulatory Visit: Payer: Self-pay | Admitting: Cardiovascular Disease

## 2017-10-03 NOTE — Telephone Encounter (Signed)
LMTCB concerning what Bystolic dosage patient is actually taking. Her chart shows both 2.52m and 5 mg.

## 2017-10-03 NOTE — Telephone Encounter (Signed)
Please review for refill, Thanks !  

## 2017-10-07 ENCOUNTER — Other Ambulatory Visit: Payer: Self-pay

## 2017-10-07 MED ORDER — NEBIVOLOL HCL 2.5 MG PO TABS: 2.5000 mg | ORAL_TABLET | Freq: Every day | ORAL | 0 refills | Status: DC

## 2017-10-07 NOTE — Telephone Encounter (Signed)
Rx(s) sent to pharmacy electronically.  

## 2017-10-14 ENCOUNTER — Emergency Department (HOSPITAL_BASED_OUTPATIENT_CLINIC_OR_DEPARTMENT_OTHER): Payer: BLUE CROSS/BLUE SHIELD

## 2017-10-14 ENCOUNTER — Encounter (HOSPITAL_BASED_OUTPATIENT_CLINIC_OR_DEPARTMENT_OTHER): Payer: Self-pay | Admitting: Emergency Medicine

## 2017-10-14 ENCOUNTER — Other Ambulatory Visit: Payer: Self-pay

## 2017-10-14 ENCOUNTER — Emergency Department (HOSPITAL_BASED_OUTPATIENT_CLINIC_OR_DEPARTMENT_OTHER)
Admission: EM | Admit: 2017-10-14 | Discharge: 2017-10-14 | Disposition: A | Discharge status: Home / Self Care | Payer: BLUE CROSS/BLUE SHIELD | Attending: Emergency Medicine | Admitting: Emergency Medicine

## 2017-10-14 DIAGNOSIS — I252 Old myocardial infarction: Secondary | ICD-10-CM | POA: Diagnosis not present

## 2017-10-14 DIAGNOSIS — I10 Essential (primary) hypertension: Secondary | ICD-10-CM | POA: Diagnosis not present

## 2017-10-14 DIAGNOSIS — R0789 Other chest pain: Secondary | ICD-10-CM | POA: Insufficient documentation

## 2017-10-14 DIAGNOSIS — R079 Chest pain, unspecified: Secondary | ICD-10-CM | POA: Diagnosis not present

## 2017-10-14 DIAGNOSIS — Z79899 Other long term (current) drug therapy: Secondary | ICD-10-CM | POA: Insufficient documentation

## 2017-10-14 DIAGNOSIS — Z7982 Long term (current) use of aspirin: Secondary | ICD-10-CM | POA: Insufficient documentation

## 2017-10-14 DIAGNOSIS — R5383 Other fatigue: Secondary | ICD-10-CM | POA: Diagnosis not present

## 2017-10-14 DIAGNOSIS — R1013 Epigastric pain: Secondary | ICD-10-CM | POA: Diagnosis not present

## 2017-10-14 LAB — COMPREHENSIVE METABOLIC PANEL
ALT: 14 U/L (ref 14–54)
AST: 23 U/L (ref 15–41)
Albumin: 3.8 g/dL (ref 3.5–5.0)
Alkaline Phosphatase: 57 U/L (ref 38–126)
Anion gap: 8 (ref 5–15)
BUN: 11 mg/dL (ref 6–20)
CHLORIDE: 105 mmol/L (ref 101–111)
CO2: 25 mmol/L (ref 22–32)
Calcium: 9.2 mg/dL (ref 8.9–10.3)
Creatinine, Ser: 0.88 mg/dL (ref 0.44–1.00)
GFR calc Af Amer: >60 mL/min (ref >60)
GFR calc non Af Amer: >60 mL/min (ref >60)
Glucose, Bld: 84 mg/dL (ref 65–99)
POTASSIUM: 3.3 mmol/L — AB (ref 3.5–5.1)
Sodium: 138 mmol/L (ref 135–145)
Total Bilirubin: 1.7 mg/dL — ABNORMAL HIGH (ref 0.3–1.2)
Total Protein: 7.7 g/dL (ref 6.5–8.1)

## 2017-10-14 LAB — CBC
HEMATOCRIT: 39.9 % (ref 36.0–46.0)
Hemoglobin: 13.2 g/dL (ref 12.0–15.0)
MCH: 31 pg (ref 26.0–34.0)
MCHC: 33.1 g/dL (ref 30.0–36.0)
MCV: 93.7 fL (ref 78.0–100.0)
Platelets: 361 10*3/uL (ref 150–400)
RBC: 4.26 MIL/uL (ref 3.87–5.11)
RDW: 13.6 % (ref 11.5–15.5)
WBC: 5.4 10*3/uL (ref 4.0–10.5)

## 2017-10-14 LAB — PREGNANCY, URINE: PREG TEST UR: NEGATIVE

## 2017-10-14 LAB — TROPONIN I
Troponin I: <0.03 ng/mL (ref <0.03)
Troponin I: <0.03 ng/mL (ref <0.03)

## 2017-10-14 LAB — D-DIMER, QUANTITATIVE (NOT AT ARMC): D DIMER QUANT: 0.51 ug{FEU}/mL — AB (ref 0.00–0.50)

## 2017-10-14 LAB — LIPASE, BLOOD: LIPASE: 32 U/L (ref 11–51)

## 2017-10-14 MED ORDER — IOPAMIDOL (ISOVUE-370) INJECTION 76%
100.0000 mL | Freq: Once | INTRAVENOUS | Status: AC | PRN
  Administered 2017-10-14: 100 mL via INTRAVENOUS

## 2017-10-14 MED ORDER — METHOCARBAMOL 500 MG PO TABS: 500.0000 mg | ORAL_TABLET | Freq: Three times a day (TID) | ORAL | 0 refills | Status: DC | PRN

## 2017-10-14 MED ORDER — MORPHINE SULFATE (PF) 4 MG/ML IV SOLN: 4.0000 mg | Freq: Once | INTRAVENOUS | Status: DC

## 2017-10-14 MED ORDER — IOPAMIDOL (ISOVUE-300) INJECTION 61%: 100.0000 mL | Freq: Once | INTRAVENOUS | Status: DC | PRN

## 2017-10-14 MED ORDER — GI COCKTAIL ~~LOC~~
30.0000 mL | Freq: Once | ORAL | Status: AC
  Administered 2017-10-14: 30 mL via ORAL
  Filled 2017-10-14: qty 30

## 2017-10-14 MED ORDER — POTASSIUM CHLORIDE CRYS ER 20 MEQ PO TBCR
40.0000 meq | EXTENDED_RELEASE_TABLET | Freq: Once | ORAL | Status: AC
  Administered 2017-10-14: 40 meq via ORAL
  Filled 2017-10-14: qty 2

## 2017-10-14 MED ORDER — ONDANSETRON HCL 4 MG/2ML IJ SOLN: 4.0000 mg | Freq: Once | INTRAMUSCULAR | Status: DC

## 2017-10-14 MED ORDER — SODIUM CHLORIDE 0.9 % IV BOLUS (SEPSIS)
1000.0000 mL | Freq: Once | INTRAVENOUS | Status: AC
  Administered 2017-10-14: 1000 mL via INTRAVENOUS

## 2017-10-14 MED ORDER — TRAMADOL HCL 50 MG PO TABS: 50.0000 mg | ORAL_TABLET | Freq: Four times a day (QID) | ORAL | 0 refills | Status: DC | PRN

## 2017-10-14 MED FILL — traMADol HCL 50 MG TABS: 50 | 3 days supply | Qty: 15 | Fill #0

## 2017-10-14 MED FILL — METHOCARBAMOL 500 MG TABLET: 500 | 10 days supply | Qty: 30 | Fill #0

## 2017-10-14 NOTE — ED Triage Notes (Signed)
Patient states that she has had pain to her right chest  - states that it hurts worse with movement. Patient states that it started 2 days ago while she was working  -

## 2017-10-14 NOTE — ED Notes (Signed)
Patient transported to CT 

## 2017-10-14 NOTE — ED Provider Notes (Signed)
[place] EMERGENCY DEPARTMENT Provider Note   CSN: [id number] Arrival date & time: 10/14/17  6270     History   Chief Complaint Chief Complaint  Patient presents with  . Chest Pain    HPI Melody Gardner is a 52 y.o. female.  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  No patient states she was at work on Friday bending over when she developed sharp, right-sided, upper chest pain.  Since then, she has had right but also epigastric pain.  The pain has now become more of an aching, throbbing sensation that she describes as possible indigestion.  No nausea or vomiting.  Does seem to get mildly worse with eating.  Denies any right upper quadrant pain with it.  She has a history of coronary dissection and states that her pain during that episode was significantly worse as well as in a different position.  Denies any shortness of breath, nausea, or diaphoresis with this.  Denies any other medical complaints.  Pain is worse with palpation and movement.  No alleviating factors.  Past Medical History:  Diagnosis Date  . Acute MI, lateral wall, initial episode of care ([place]) 12/18/2015  . Anemia 2013  . Crohn's disease ([place])   . Depression    pt states she is depressed but not medically diagnosed  . Hypertension 2012  . Hypertensive heart disease 12/19/2015    Patient Active Problem List   Diagnosis Date Noted  . Neck nodule 05/17/2016  . Hypertensive heart disease 12/19/2015  . 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 Acute MI, lateral wall, initial episode of care ([place]) 12/18/2015  . HTN (hypertension) 01/07/2015  . S/P hydrothermal endometrial ablation on 03/31/2012 03/31/2012  . Endometrial polyp 01/31/2012  . Fibroids 01/30/2012  . Abnormal uterine bleeding 01/30/2012    Past Surgical History:  Procedure Laterality Date  . CARDIAC CATHETERIZATION N/A 12/18/2015   Procedure: Left Heart Cath and Coronary Angiography;  Surgeon: Sherren Mocha, MD;  Location: [facility] CV LAB;  Service: Cardiovascular;  Laterality: N/A;  . ENDOMETRIAL ABLATION  2013  . LAPAROSCOPY FOR ECTOPIC PREGNANCY    . RENAL ARTERY STENT    . TUBAL LIGATION  1989    OB History    Gravida Para Term Preterm AB Living   3 2 2   1 2    SAB TAB Ectopic Multiple Live Births       1          Obstetric Comments   01/20/86 pregnancy was twin pregnancy, one fetus did not develop.       Home Medications    Prior to Admission medications   Medication Sig Start Date End Date Taking? Authorizing Provider  aspirin EC 81 MG tablet Take 1 tablet (81 mg total) by mouth daily. 12/22/15   Arbutus Leas, NP  azaTHIOprine (IMURAN) 50 MG tablet Take 50 mg by mouth 3 (three) times daily.    [provider]  balsalazide (COLAZAL) 750 MG capsule Take 2,250 mg by mouth 3 (three) times daily.    [provider]  fluticasone (FLONASE) 50 MCG/ACT nasal spray SHAKE LIQUID AND USE 2 SPRAYS IN [facility] NOSTRIL DAILY 05/14/17   Saguier, Percell Miller, PA-C  losartan (COZAAR) 100 MG tablet Take 1 tablet (100 mg total) by mouth daily. KEEP OV. 07/23/17   Skeet Latch, MD  methocarbamol (ROBAXIN) 500 MG tablet Take 1 tablet (500 mg total) by mouth every 8 (  eight) hours as needed for muscle spasms. 10/14/17   Duffy Bruce, MD  Multiple Vitamins-Minerals (WOMENS ONE DAILY PO) Take 1 tablet by mouth daily.    [provider]  nebivolol (BYSTOLIC) 2.5 MG tablet Take 1 tablet (2.5 mg total) by mouth daily. NEEDS APPOINTMENT FOR FUTURE REFILLS 10/07/17   Skeet Latch, MD  nebivolol (BYSTOLIC) 5 MG tablet TAKE 1 TABLET(5 MG) BY MOUTH DAILY 08/16/17   Skeet Latch, MD  nitroGLYCERIN (NITROSTAT) 0.4 MG SL tablet Place 1 tablet (0.4 mg total) under the tongue every 5 (five) minutes as needed for chest pain. 12/22/15   Arbutus Leas, NP  traMADol (ULTRAM) 50 MG tablet Take 1 tablet (50 mg total) by mouth every 6 (six) hours as needed for severe pain. 10/14/17    Duffy Bruce, MD    Family History Family History  Problem Relation Age of Onset  . Hypertension Mother   . Cancer Brother   . Diabetes Brother   . Diabetes Father   . Stroke Father   . Neuropathy Father   . Hyperlipidemia Father   . Neuropathy Brother   . Thyroid disease Neg Hx     Social History Social History   Tobacco Use  . Smoking status: Never Smoker  . Smokeless tobacco: Never Used  Substance Use Topics  . Alcohol use: No  . Drug use: No     Allergies   Folic acid; Sulfa antibiotics; and Sulfasalazine   Review of Systems Review of Systems  Constitutional: Positive for fatigue. Negative for chills and fever.  HENT: Negative for congestion, rhinorrhea and sore throat.   Eyes: Negative for visual disturbance.  Respiratory: Negative for cough, shortness of breath and wheezing.   Cardiovascular: Positive for chest pain. Negative for leg swelling.  Gastrointestinal: Negative for abdominal pain, diarrhea, nausea and vomiting.  Genitourinary: Negative for dysuria, flank pain, vaginal bleeding and vaginal discharge.  Musculoskeletal: Negative for neck pain.  Skin: Negative for rash.  Allergic/Immunologic: Negative for immunocompromised state.  Neurological: Negative for syncope and headaches.  Hematological: Does not bruise/bleed easily.  All other systems reviewed and are negative.    Physical Exam Updated Vital Signs BP (!) 160/93 (BP Location: Right Arm)   Pulse 60   Temp 98.7 F (37.1 C) (Oral)   Resp 18   Ht 5' 7"  (1.702 m)   Wt 72.6 kg (160 lb)   LMP 09/30/2017   SpO2 100%   BMI 25.06 kg/m   Physical Exam  Constitutional: She is oriented to person, place, and time. She appears well-developed and well-nourished. No distress.  HENT:  Head: Normocephalic and atraumatic.  Eyes: Conjunctivae are normal.  Neck: Neck supple.  Cardiovascular: Normal rate, regular rhythm and normal heart sounds. Exam reveals no friction rub.  No murmur  heard. Pulmonary/Chest: Effort normal and breath sounds normal. No respiratory distress. She has no wheezes. She has no rales.  Significant pinpoint TTP over right upper chest wall intercostal spaces. No deformity. No bruising.  Abdominal: She exhibits no distension.  Musculoskeletal: She exhibits no edema.  Neurological: She is alert and oriented to person, place, and time. She exhibits normal muscle tone.  Skin: Skin is warm. Capillary refill takes less than 2 seconds.  Psychiatric: She has a normal mood and affect.  Nursing note and vitals reviewed.    ED Treatments / Results  Labs (all labs ordered are listed, but only abnormal results are displayed) Labs Reviewed  D-DIMER, QUANTITATIVE (NOT AT [facility]) - Abnormal; Notable for  the following components:      Result Value   D-Dimer, Quant 0.51 (*)    All other components within normal limits  COMPREHENSIVE METABOLIC PANEL - Abnormal; Notable for the following components:   Potassium 3.3 (*)    Total Bilirubin 1.7 (*)    All other components within normal limits  CBC  TROPONIN I  PREGNANCY, URINE  LIPASE, BLOOD  TROPONIN I    EKG  EKG Interpretation  Date/Time:  Monday October 14 2017 09:43:09 EST Ventricular Rate:  73 PR Interval:    QRS Duration: 105 QT Interval:  398 QTC Calculation: 439 R Axis:   31 Text Interpretation:  Sinus rhythm No significant change since last tracing Confirmed by Duffy Bruce [phone]) on 10/14/2017 10:11:28 AM       Radiology Dg Chest 2 View  Result Date: 10/14/2017 CLINICAL DATA:  Right-sided chest pain. EXAM: CHEST  2 VIEW COMPARISON:  Chest x-rays dated 05/10/2017 and 02/06/2016 FINDINGS: The heart size and mediastinal contours are within normal limits. Both lungs are clear. Chronic mild thoracic scoliosis. IMPRESSION: No active cardiopulmonary disease. Electronically Signed   By: Lorriane Shire M.D.   On: 10/14/2017 10:13   Ct Angio Chest Pe W And/or Wo Contrast  Result Date:  10/14/2017 CLINICAL DATA:  Chest pain.  History of Crohn's disease EXAM: CT ANGIOGRAPHY CHEST WITH CONTRAST TECHNIQUE: Multidetector CT imaging of the chest was performed using the standard protocol during bolus administration of intravenous contrast. Multiplanar CT image reconstructions and MIPs were obtained to evaluate the vascular anatomy. CONTRAST:  149m ISOVUE-370 IOPAMIDOL (ISOVUE-370) INJECTION 76% COMPARISON:  Chest CT December 18, 2015; chest radiograph October 14, 2017 FINDINGS: Cardiovascular: There is no demonstrable pulmonary embolus. There is no appreciable thoracic aortic aneurysm or dissection. The visualized great vessels appear unremarkable. There is no appreciable pericardial effusion or pericardial thickening. Mediastinum/Nodes: Thyroid appears unremarkable. There is no appreciable thoracic adenopathy. There is a small hiatal hernia. Lungs/Pleura: Lungs are clear. There is no edema or consolidation. No pleural effusion or pleural thickening evident. Upper Abdomen: Visualized upper abdominal structures are normal. Musculoskeletal: There is midthoracic dextroscoliosis. There are no blastic or lytic bone lesions. Review of the MIP images confirms the above findings. IMPRESSION: 1. No demonstrable pulmonary embolus. No thoracic aortic lesion evident. 2.  Lungs clear. 3.  Small hiatal hernia. 4.  No appreciable adenopathy. Electronically Signed   By: WLowella GripIII M.D.   On: 10/14/2017 10:48    Procedures Procedures (including critical care time)  Medications Ordered in ED Medications  ondansetron (ZOFRAN) injection 4 mg (4 mg Intravenous Not Given 10/14/17 1027)  sodium chloride 0.9 % bolus 1,000 mL (0 mLs Intravenous Stopped 10/14/17 1055)  gi cocktail (Maalox,Lidocaine,Donnatal) (30 mLs Oral Given 10/14/17 1027)  iopamidol (ISOVUE-370) 76 % injection 100 mL (100 mLs Intravenous Contrast Given 10/14/17 1032)  potassium chloride SA (K-DUR,KLOR-CON) CR tablet 40 mEq (40 mEq Oral Given  10/14/17 1113)     Initial Impression / Assessment and Plan / ED Course  I have reviewed the triage vital signs and the nursing notes.  Pertinent labs & imaging results that were available during my care of the patient were reviewed by me and considered in my medical decision making (see chart for details).     52yo F with h/o SCAD here with right upper chest wall pain. Exam, history is most c/w likely MSk chest wall pain that began while bending at work. That being said, given her coronary history will  check labs, also check D-Dimer given mild pleuritic component as well as h/o Crohn's. No RUQ pain, no nausea/vomiting, doubt biliary disease. Pain does not radiate to back, pulses symmetric, doubt dissection. Will f/u labs, imaging, and re-assess. Of note, remainder of coronary arteries were normal on cath 3/17.  Trop neg, EKG non-ischemic. Doubt ACS. Given her h/o SCAD, will discuss with Cardiology but suspect that she can be followed up as outpt for this. Otherwise, she has mildly elevated D-Dimer - will check CT Angio, continue supportive care.  CT angios negative.  The patient feels improved in the ED.  Potassium has been replaced.  Discussed with Dr. Marlou Porch of cardiology.  Given absence of other coronary disease and negative troponin despite constant symptoms, doubt that this is coronary disease or recurrent dissection and she can be followed up as an outpatient from a cardiac perspective.  Discussed with the patient who is in agreement.  Will discharge with supportive care for possible chest wall pain, and good return precautions.  Final Clinical Impressions(s) / ED Diagnoses   Final diagnoses:  Atypical chest pain    ED Discharge Orders        Ordered    methocarbamol (ROBAXIN) 500 MG tablet  Every 8 hours PRN     10/14/17 1124    traMADol (ULTRAM) 50 MG tablet  Every 6 hours PRN     10/14/17 1124       Duffy Bruce, MD 10/14/17 1242

## 2017-10-18 ENCOUNTER — Other Ambulatory Visit: Payer: Self-pay | Admitting: *Deleted

## 2017-10-25 ENCOUNTER — Ambulatory Visit: Payer: BLUE CROSS/BLUE SHIELD | Admitting: Family Medicine

## 2017-10-28 ENCOUNTER — Ambulatory Visit: Payer: BLUE CROSS/BLUE SHIELD | Admitting: Family Medicine

## 2017-10-28 ENCOUNTER — Encounter: Payer: Self-pay | Admitting: Family Medicine

## 2017-10-28 VITALS — BP 136/86 | HR 75 | Temp 98.3°F | Resp 16 | Ht 66.0 in | Wt 162.6 lb

## 2017-10-28 DIAGNOSIS — N760 Acute vaginitis: Secondary | ICD-10-CM

## 2017-10-28 DIAGNOSIS — R300 Dysuria: Secondary | ICD-10-CM

## 2017-10-28 DIAGNOSIS — K50118 Crohn's disease of large intestine with other complication: Secondary | ICD-10-CM | POA: Diagnosis not present

## 2017-10-28 LAB — POC URINALSYSI DIPSTICK (AUTOMATED)
BILIRUBIN UA: NEGATIVE
Blood, UA: NEGATIVE
GLUCOSE UA: NEGATIVE
KETONES UA: NEGATIVE
LEUKOCYTES UA: NEGATIVE
NITRITE UA: NEGATIVE
PH UA: 6 (ref 5.0–8.0)
Protein, UA: NEGATIVE
Spec Grav, UA: 1.025 (ref 1.010–1.025)
Urobilinogen, UA: 0.2 E.U./dL

## 2017-10-28 MED ORDER — FLUCONAZOLE 150 MG PO TABS: ORAL_TABLET | ORAL | 0 refills | Status: DC

## 2017-10-28 NOTE — Progress Notes (Signed)
Patient ID: Melody Gardner, female   DOB: 1966/09/26, 52 y.o.   MRN: [id number]     Subjective:  I acted as a Education administrator for Dr. Carollee Herter.  Guerry Bruin, [place]   Patient ID: Melody Gardner, female    DOB: 11/18/1965, 52 y.o.   MRN: [id number]  Chief Complaint  Patient presents with  . urine smells  . Vaginal Discharge    HPI   Patient is in today for vaginal discharge and smelly urine.   Vaginal discharge has been going on for about 3 weeks.  Dysuria and smelly urine has been going on for about 3 weeks as well.  Patient Care Team: Carollee Herter, Alferd Apa, DO as PCP - General (Family Medicine) Hale Bogus., MD as Referring Physician (Gastroenterology) Skeet Latch, MD as Attending Physician (Cardiology) Renato Shin, MD as Consulting Physician (Endocrinology)   Past Medical History:  Diagnosis Date  . Acute MI, lateral wall, initial episode of care ([place]) 12/18/2015  . Anemia 2013  . Crohn's disease ([place])   . Depression    pt states she is depressed but not medically diagnosed  . Hypertension 2012  . Hypertensive heart disease 12/19/2015    Past Surgical History:  Procedure Laterality Date  . CARDIAC CATHETERIZATION N/A 12/18/2015   Procedure: Left Heart Cath and Coronary Angiography;  Surgeon: Sherren Mocha, MD;  Location: [facility];  Service: Cardiovascular;  Laterality: N/A;  . ENDOMETRIAL ABLATION  2013  . LAPAROSCOPY FOR ECTOPIC PREGNANCY    . RENAL ARTERY STENT    . TUBAL LIGATION  1989    Family History  Problem Relation Age of Onset  . Hypertension Mother   . Cancer Brother   . Diabetes Brother   . Diabetes Father   . Stroke Father   . Neuropathy Father   . Hyperlipidemia Father   . Neuropathy Brother   . Thyroid disease Neg Hx     Social History   Socioeconomic History  . Marital status: Married    Spouse name: Not on file  . Number of children: Not on file  . Years of education: Not on file  . Highest education level: Not on file  Social Needs    . Financial resource strain: Not on file  . Food insecurity - worry: Not on file  . Food insecurity - inability: Not on file  . Transportation needs - medical: Not on file  . Transportation needs - non-medical: Not on file  Occupational History    Comment: [employer]  Tobacco Use  . Smoking status: Never Smoker  . Smokeless tobacco: Never Used  Substance and Sexual Activity  . Alcohol use: No  . Drug use: No  . Sexual activity: Yes    Birth control/protection: Surgical  Other Topics Concern  . Not on file  Social History Narrative  . Not on file    Outpatient Medications Prior to Visit  Medication Sig Dispense Refill  . aspirin EC 81 MG tablet Take 1 tablet (81 mg total) by mouth daily. 30 tablet 12  . azaTHIOprine (IMURAN) 50 MG tablet Take 50 mg by mouth 3 (three) times daily.    . balsalazide (COLAZAL) 750 MG capsule Take 2,250 mg by mouth 3 (three) times daily.    Marland Kitchen losartan (COZAAR) 100 MG tablet Take 1 tablet (100 mg total) by mouth daily. KEEP OV. 90 tablet 0  . Multiple Vitamins-Minerals (WOMENS ONE DAILY PO) Take 1 tablet by mouth daily.    . nebivolol (BYSTOLIC) 2.5  MG tablet Take 1 tablet (2.5 mg total) by mouth daily. NEEDS APPOINTMENT FOR FUTURE REFILLS 15 tablet 0  . nitroGLYCERIN (NITROSTAT) 0.4 MG SL tablet Place 1 tablet (0.4 mg total) under the tongue every 5 (five) minutes as needed for chest pain. 25 tablet 1  . BYSTOLIC 2.5 MG tablet TAKE 1 TABLET(2.5 MG) BY MOUTH DAILY 30 tablet 0  . fluticasone (FLONASE) 50 MCG/ACT nasal spray SHAKE LIQUID AND USE 2 SPRAYS IN EACH NOSTRIL DAILY 16 g 1  . methocarbamol (ROBAXIN) 500 MG tablet Take 1 tablet (500 mg total) by mouth every 8 (eight) hours as needed for muscle spasms. 30 tablet 0  . traMADol (ULTRAM) 50 MG tablet Take 1 tablet (50 mg total) by mouth every 6 (six) hours as needed for severe pain. 15 tablet 0   No facility-administered medications prior to visit.     Allergies  Allergen Reactions  . Folic  Acid Rash    Pt declines this allergy  . Sulfa Antibiotics Rash  . Sulfasalazine Itching and Rash    Review of Systems  Constitutional: Negative for fever and malaise/fatigue.  HENT: Negative for congestion.   Eyes: Negative for blurred vision.  Respiratory: Negative for cough and shortness of breath.   Cardiovascular: Negative for chest pain, palpitations and leg swelling.  Gastrointestinal: Negative for vomiting.  Genitourinary: Positive for dysuria.  Musculoskeletal: Negative for back pain.  Skin: Negative for rash.  Neurological: Negative for loss of consciousness and headaches.       Objective:    Physical Exam  Constitutional: She is oriented to person, place, and time. She appears well-developed and well-nourished.  HENT:  Head: Normocephalic and atraumatic.  Eyes: Conjunctivae and EOM are normal.  Neck: Normal range of motion. Neck supple. No JVD present. Carotid bruit is not present. No thyromegaly present.  Cardiovascular: Normal rate, regular rhythm and normal heart sounds.  No murmur heard. Pulmonary/Chest: Effort normal and breath sounds normal. No respiratory distress. She has no wheezes. She has no rales. She exhibits no tenderness.  Genitourinary: Cervix exhibits discharge. Cervix exhibits no motion tenderness and no friability. Vaginal discharge found.  Musculoskeletal: She exhibits no edema.  Neurological: She is alert and oriented to person, place, and time.  Psychiatric: She has a normal mood and affect.  Nursing note and vitals reviewed.   BP 136/86 (BP Location: Left Arm, Cuff Size: Normal)   Pulse 75   Temp 98.3 F (36.8 C) (Oral)   Resp 16   Ht 5' 6"  (1.676 m)   Wt 162 lb 9.6 oz (73.8 kg)   LMP 10/20/2017   SpO2 98%   BMI 26.24 kg/m  Wt Readings from Last 3 Encounters:  10/28/17 162 lb 9.6 oz (73.8 kg)  10/14/17 160 lb (72.6 kg)  06/14/17 162 lb (73.5 kg)   BP Readings from Last 3 Encounters:  10/28/17 136/86  10/14/17 (!) 157/99    06/14/17 106/78     Immunization History  Administered Date(s) Administered  . PPD Test 03/22/2014, 01/11/2017  . Tdap 01/06/2014    Health Maintenance  Topic Date Due  . HIV Screening  01/18/1981  . INFLUENZA VACCINE  12/29/2017 (Originally 05/01/2017)  . MAMMOGRAM  06/12/2018  . PAP SMEAR  12/14/2019  . TETANUS/TDAP  01/07/2024  . COLONOSCOPY  09/22/2025    Lab Results  Component Value Date   WBC 5.4 10/14/2017   HGB 13.2 10/14/2017   HCT 39.9 10/14/2017   PLT 361 10/14/2017   GLUCOSE 84 10/14/2017  CHOL 124 06/12/2016   TRIG 53.0 06/12/2016   HDL 44.70 06/12/2016   LDLCALC 69 06/12/2016   ALT 14 10/14/2017   AST 23 10/14/2017   NA 138 10/14/2017   K 3.3 (L) 10/14/2017   CL 105 10/14/2017   CREATININE 0.88 10/14/2017   BUN 11 10/14/2017   CO2 25 10/14/2017   TSH 1.23 06/12/2016    Lab Results  Component Value Date   TSH 1.23 06/12/2016   Lab Results  Component Value Date   WBC 5.4 10/14/2017   HGB 13.2 10/14/2017   HCT 39.9 10/14/2017   MCV 93.7 10/14/2017   PLT 361 10/14/2017   Lab Results  Component Value Date   NA 138 10/14/2017   K 3.3 (L) 10/14/2017   CO2 25 10/14/2017   GLUCOSE 84 10/14/2017   BUN 11 10/14/2017   CREATININE 0.88 10/14/2017   BILITOT 1.7 (H) 10/14/2017   ALKPHOS 57 10/14/2017   AST 23 10/14/2017   ALT 14 10/14/2017   PROT 7.7 10/14/2017   ALBUMIN 3.8 10/14/2017   CALCIUM 9.2 10/14/2017   ANIONGAP 8 10/14/2017   GFR 103.43 06/12/2016   Lab Results  Component Value Date   CHOL 124 06/12/2016   Lab Results  Component Value Date   HDL 44.70 06/12/2016   Lab Results  Component Value Date   LDLCALC 69 06/12/2016   Lab Results  Component Value Date   TRIG 53.0 06/12/2016   Lab Results  Component Value Date   CHOLHDL 3 06/12/2016   No results found for: HGBA1C       Assessment & Plan:   Problem List Items Addressed This Visit    None    Visit Diagnoses    Acute vaginitis    -  Primary   Relevant  Medications   fluconazole (DIFLUCAN) 150 MG tablet   Other Relevant Orders   Cervicovaginal ancillary only   Dysuria       Relevant Orders   POCT Urinalysis Dipstick (Automated) (Completed)   Urine Culture      I have discontinued Loukisha Usrey's fluticasone, BYSTOLIC, methocarbamol, and traMADol. I am also having her start on fluconazole. Additionally, I am having her maintain her balsalazide, nitroGLYCERIN, aspirin EC, Multiple Vitamins-Minerals (WOMENS ONE DAILY PO), azaTHIOprine, losartan, and nebivolol.  Meds ordered this encounter  Medications  . fluconazole (DIFLUCAN) 150 MG tablet    Sig: 1 po x1, may repeat in 3 days prn    Dispense:  2 tablet    Refill:  0   CMA served as scribe during this visit. History, Physical and Plan performed by medical provider. Documentation and orders reviewed and attested to.  Ann Held, DO

## 2017-10-28 NOTE — Patient Instructions (Signed)

## 2017-10-29 ENCOUNTER — Other Ambulatory Visit: Payer: Self-pay | Admitting: Cardiovascular Disease

## 2017-10-29 DIAGNOSIS — I10 Essential (primary) hypertension: Secondary | ICD-10-CM

## 2017-10-29 LAB — CERVICOVAGINAL ANCILLARY ONLY
BACTERIAL VAGINITIS: NEGATIVE
CANDIDA VAGINITIS: POSITIVE — AB
Chlamydia: NEGATIVE
Neisseria Gonorrhea: NEGATIVE
TRICH (WINDOWPATH): NEGATIVE

## 2017-10-29 NOTE — Telephone Encounter (Signed)
Rx(s) sent to pharmacy electronically.  

## 2017-10-29 NOTE — Telephone Encounter (Signed)
Please review for refill. Thanks!  

## 2017-10-30 ENCOUNTER — Telehealth: Payer: Self-pay | Admitting: Family Medicine

## 2017-10-30 DIAGNOSIS — N760 Acute vaginitis: Secondary | ICD-10-CM

## 2017-10-30 LAB — URINE CULTURE
MICRO NUMBER: 90115748
SPECIMEN QUALITY:: ADEQUATE

## 2017-10-30 MED ORDER — FLUCONAZOLE 150 MG PO TABS: ORAL_TABLET | ORAL | 0 refills | Status: DC

## 2017-10-30 NOTE — Telephone Encounter (Signed)
Patient stated that she needed that 2nd tab.  rx sent in.

## 2017-10-30 NOTE — Telephone Encounter (Signed)
Copied from [place] [id number]. Topic: Quick Communication - Rx Refill/Question >> Oct 30, 2017  8:13 AM Waylan Rocher, [place] L wrote: Medication: fluconazole (DIFLUCAN) 150 MG tablet Has the patient contacted their pharmacy? Yes.   (Agent: If no, request that the patient contact the pharmacy for the refill.) Preferred Pharmacy (with phone number or street name): [facility] [place], [place] - [address] [place] [address] Phone: [phone] Fax: [phone] Agent: Please be advised that RX refills may take up to 3 business days. We ask that you follow-up with your pharmacy. Patient received 2 of these pills on 10/28/2017 but lost one so she is needing another one called in.

## 2017-10-30 NOTE — Telephone Encounter (Signed)
Left message on machine advising that she only needs to take the second if she really needs it.  And also if I call one in ins may not pay, but I will be happy to send in if needed.

## 2017-11-05 ENCOUNTER — Other Ambulatory Visit: Payer: Self-pay | Admitting: *Deleted

## 2017-11-05 MED ORDER — CIPROFLOXACIN HCL 250 MG PO TABS: 250.0000 mg | ORAL_TABLET | Freq: Two times a day (BID) | ORAL | 0 refills | Status: DC

## 2017-11-14 ENCOUNTER — Telehealth: Payer: Self-pay | Admitting: *Deleted

## 2017-11-14 NOTE — Telephone Encounter (Signed)
PA submitted to Covermymeds

## 2017-11-22 NOTE — Telephone Encounter (Signed)
Denied, waiting for Dr Oval Linsey to review and give recommendations

## 2017-12-08 ENCOUNTER — Other Ambulatory Visit: Payer: Self-pay | Admitting: Cardiovascular Disease

## 2017-12-09 NOTE — Telephone Encounter (Signed)
Please review for refill, Thanks !  

## 2017-12-16 ENCOUNTER — Ambulatory Visit: Payer: BLUE CROSS/BLUE SHIELD | Admitting: Cardiovascular Disease

## 2018-01-01 ENCOUNTER — Telehealth: Payer: Self-pay | Admitting: *Deleted

## 2018-01-01 MED ORDER — METOPROLOL TARTRATE 25 MG PO TABS: 25.0000 mg | ORAL_TABLET | Freq: Two times a day (BID) | ORAL | 2 refills | Status: DC

## 2018-01-01 NOTE — Telephone Encounter (Signed)
Trying to get patients Bystolic approved with insurance, has been denied. Spoke with patient and she has not tried and failed the preferred medications (Atenolol, Carvedilol, Metoprolol, and Bisoprolol)  Discussed with Dr Oval Linsey and ok to change to Metoprolol Tart 25 mg twice a day. Advised patient, verbalized understanding.

## 2018-01-07 NOTE — Telephone Encounter (Signed)
Me   01/01/18 10:13 AM  Note    Trying to get patients Bystolic approved with insurance, has been denied. Spoke with patient and she has not tried and failed the preferred medications (Atenolol, Carvedilol, Metoprolol, and Bisoprolol)  Discussed with Dr Oval Linsey and ok to change to Metoprolol Tart 25 mg twice a day. Advised patient, verbalized understanding.

## 2018-01-25 ENCOUNTER — Other Ambulatory Visit: Payer: Self-pay | Admitting: Medical

## 2018-01-27 DIAGNOSIS — K5080 Crohn's disease of both small and large intestine without complications: Secondary | ICD-10-CM | POA: Diagnosis not present

## 2018-01-29 ENCOUNTER — Other Ambulatory Visit: Payer: Self-pay | Admitting: Cardiovascular Disease

## 2018-01-29 DIAGNOSIS — I10 Essential (primary) hypertension: Secondary | ICD-10-CM

## 2018-01-29 NOTE — Telephone Encounter (Signed)
Rx sent to pharmacy   

## 2018-01-30 DIAGNOSIS — K5080 Crohn's disease of both small and large intestine without complications: Secondary | ICD-10-CM | POA: Diagnosis not present

## 2018-01-30 DIAGNOSIS — K648 Other hemorrhoids: Secondary | ICD-10-CM | POA: Diagnosis not present

## 2018-01-30 DIAGNOSIS — K633 Ulcer of intestine: Secondary | ICD-10-CM | POA: Diagnosis not present

## 2018-01-30 DIAGNOSIS — K6389 Other specified diseases of intestine: Secondary | ICD-10-CM | POA: Diagnosis not present

## 2018-02-06 ENCOUNTER — Ambulatory Visit: Payer: BLUE CROSS/BLUE SHIELD | Admitting: Cardiovascular Disease

## 2018-02-06 ENCOUNTER — Encounter: Payer: Self-pay | Admitting: Cardiovascular Disease

## 2018-02-06 VITALS — BP 138/100 | HR 76 | Ht 68.0 in | Wt 168.0 lb

## 2018-02-06 DIAGNOSIS — Z5181 Encounter for therapeutic drug level monitoring: Secondary | ICD-10-CM

## 2018-02-06 DIAGNOSIS — I25110 Atherosclerotic heart disease of native coronary artery with unstable angina pectoris: Secondary | ICD-10-CM

## 2018-02-06 DIAGNOSIS — I6523 Occlusion and stenosis of bilateral carotid arteries: Secondary | ICD-10-CM

## 2018-02-06 DIAGNOSIS — I773 Arterial fibromuscular dysplasia: Secondary | ICD-10-CM

## 2018-02-06 DIAGNOSIS — I10 Essential (primary) hypertension: Secondary | ICD-10-CM

## 2018-02-06 MED ORDER — LOSARTAN POTASSIUM-HCTZ 100-12.5 MG PO TABS: 1.0000 | ORAL_TABLET | Freq: Every day | ORAL | 5 refills | Status: DC

## 2018-02-06 NOTE — Progress Notes (Signed)
Cardiology Office Note   Date:  02/06/2018   ID:  Melody Gardner, DOB 09-25-1966, MRN [id number]  PCP:  Carollee Herter, Alferd Apa, DO  Cardiologist:   Skeet Latch, MD   No chief complaint on file.    History of Present Illness: Melody Gardner is a 52 y.o. 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  Melody Gardner was admitted 12/18/15 with chest pain.  Cardiac enzymes were elevated and she had mild lateral ST elevation.  In the cath she was found to have a spontaneous dissection with 100% occlusion of D1 that was medically managed. Her echo showed LVEF 60-65% with grade 1 diastolic dysfunction.   Melody Gardner reports having her renal artery stented when she was around 7.   Given this history, there was concern for fibromuscular dysplasia.  She was referred for carotid ultrasound that revealed bilateral intimal thickening with 1-39% stenosis bilaterally but no plaque.   Melody Gardner underwent CT angiography of her whole body 03/2016.  She was found to have bilateral cervical ICA fibromuscular dysplasia.  Carotid ultrasound revealed 1-39% stenosis bilaterally.  She was evaluated by Dr. Scot Dock 07/04/16 and it was felt that no intervention was necessary. There was no FMD of the renal arteries.  She was also noted to have a 12 mm soft tissue nodule in the right neck suspicious for an ectopic parathyroid adenoma and dental caries.  Since her last appointment she has been doing well.  She hasn't had any chest pain recently.  She denies shortness of breath, lower extremity edema, orthopnea or PND.  She had to stop taking nebivolol and start taking metoprolol because her insurance company would no longer pay for it.  Since then she notes a slight increase in fatigue but is otherwise doing okay.  She does not check her blood pressure regularly at home.  She works out 3 to 4  days/week and has no exertional symptoms.  Overall she feels well.   Past Medical History:  Diagnosis Date  . Acute MI, lateral wall, initial episode of care ([place]) 12/18/2015  . Anemia 2013  . Crohn's disease ([place])   . Depression    pt states she is depressed but not medically diagnosed  . Hypertension 2012  . Hypertensive heart disease 12/19/2015    Past Surgical History:  Procedure Laterality Date  . CARDIAC CATHETERIZATION N/A 12/18/2015   Procedure: Left Heart Cath and Coronary Angiography;  Surgeon: Sherren Mocha, MD;  Location: [facility];  Service: Cardiovascular;  Laterality: N/A;  . ENDOMETRIAL ABLATION  2013  . LAPAROSCOPY FOR ECTOPIC PREGNANCY    . RENAL ARTERY STENT    . TUBAL LIGATION  1989     Current Outpatient Medications  Medication Sig Dispense Refill  . aspirin EC 81 MG tablet Take 1 tablet (81 mg total) by mouth daily. 30 tablet 12  . azaTHIOprine (IMURAN) 50 MG tablet Take 50 mg by mouth 3 (three) times daily.    . balsalazide (COLAZAL) 750 MG capsule Take 2,250 mg by mouth 3 (three) times daily.    . fluticasone (FLONASE) 50 MCG/ACT nasal spray SHAKE LIQUID AND USE 2 SPRAYS IN EACH NOSTRIL DAILY 16 g 0  . metoprolol tartrate (LOPRESSOR) 25 MG tablet Take 1 tablet (25 mg total) by mouth 2 (two) times daily. 60 tablet 2  . Multiple Vitamins-Minerals (WOMENS ONE DAILY PO) Take 1 tablet  by mouth daily.    . nitroGLYCERIN (NITROSTAT) 0.4 MG SL tablet Place 1 tablet (0.4 mg total) under the tongue every 5 (five) minutes as needed for chest pain. 25 tablet 1  . losartan-hydrochlorothiazide (HYZAAR) 100-12.5 MG tablet Take 1 tablet by mouth daily. 30 tablet 5   No current facility-administered medications for this visit.     Allergies:   Folic acid; Sulfa antibiotics; and Sulfasalazine    Social History:  The patient  reports that she has never smoked. She has never used smokeless tobacco. She reports that she does not drink alcohol or use drugs.    Family History:  The patient's family history includes Cancer in her brother; Diabetes in her brother and father; Hyperlipidemia in her father; Hypertension in her mother; Neuropathy in her brother and father; Stroke in her father.    ROS:  Please see the history of present illness.   Otherwise, review of systems are positive for none.   All other systems are reviewed and negative.    PHYSICAL EXAM: VS:  BP (!) 138/100   Pulse 76   Ht 5' 8"  (1.727 m)   Wt 168 lb (76.2 kg)   BMI 25.54 kg/m  , BMI Body mass index is 25.54 kg/m. GENERAL:  Well appearing.  No acute distress. HEENT:  Pupils equal round and reactive, fundi not visualized, oral mucosa unremarkable NECK:  No jugular venous distention, waveform within normal limits, carotid upstroke brisk and symmetric, no bruits. LUNGS:  Clear to auscultation bilaterally HEART:  RRR.  PMI not displaced or sustained,S1 and S2 within normal limits, no S3, no S4, no clicks, no rubs, no murmurs ABD:  Flat, positive bowel sounds normal in frequency in pitch, no bruits, no rebound, no guarding, no midline pulsatile mass, no hepatomegaly, no splenomegaly EXT:  2+ radial pulses bilaterally.  1+ L DP/TP. 2+ R DP/TP.  No edema, no cyanosis no clubbing SKIN:  No rashes no nodules NEURO:  Cranial nerves II through XII grossly intact, motor grossly intact throughout PSYCH:  Cognitively intact, oriented to person place and time   EKG:  EKG is ordered today. 04/26/16: Sinus rhythm rate 79 bpm.  LVH 02/06/18: Sinus rhythm.  Rate 76 bpm.    LHC 12/18/15: 100% D1 due to spontaneous dissection.  No other coronary artery disease.  Echo 12/19/15: Study Conclusions  - Left ventricle: The cavity size was normal. Wall thickness was  normal. Systolic function was normal. The estimated ejection  fraction was in the range of 60% to 65%. Mildly abnormal GLPSS at  -17%, more regional inferior strain. Wall motion was normal;  there were no regional wall motion  abnormalities. Doppler  parameters are consistent with abnormal left ventricular  relaxation (grade 1 diastolic dysfunction). The E/e&' ratio is  between 8-15, suggesting indeterminate LV filling pressure. - Left atrium: The atrium was normal in size. - Tricuspid valve: There was no significant regurgitation. - Inferior vena cava: The vessel was normal in size. The  respirophasic diameter changes were in the normal range (= 50%),  consistent with normal central venous pressure.  Impressions:  - LVEF 60-65%, normal wall thickness, normal wall motion, mildly  abnormal GLPSS at -17% (inferior), Grade 1 DD with indeterminate  LV filling pressure, normal chamber sizes, normal IVC.  Carotid Doppler 01/12/16: Intimal thickening bilaterally.  1-39% ICA stenosis bilaterally.    Recent Labs: 10/14/2017: ALT 14; BUN 11; Creatinine, Ser 0.88; Hemoglobin 13.2; Platelets 361; Potassium 3.3; Sodium 138    Lipid  Panel    Component Value Date/Time   CHOL 124 06/12/2016 1442   TRIG 53.0 06/12/2016 1442   HDL 44.70 06/12/2016 1442   CHOLHDL 3 06/12/2016 1442   VLDL 10.6 06/12/2016 1442   LDLCALC 69 06/12/2016 1442      Wt Readings from Last 3 Encounters:  02/06/18 168 lb (76.2 kg)  10/28/17 162 lb 9.6 oz (73.8 kg)  10/14/17 160 lb (72.6 kg)      ASSESSMENT AND PLAN:  # Spontaneous coronary artery dissection:  # Fibromuscular dysplasia: # ICA stenosis: Melody Gardner had SCAD of D1 and has bilateral ICA stenosis.  She is doing well and has no symptoms.  Continue aspirin and metoprolol.    # Hypertension:  Blood pressure above goal after switching from nebivolol to metoprolol.  She has some mild fatigue so we will not further titrate her metoprolol.  We will add Hydrocort thiazide 12.5 and a combination losartan 100/12.5 mg tablet.  Check basic metabolic panel in 1 week.   Current medicines are reviewed at length with the patient today.  The patient does not have concerns regarding  medicines.  The following changes have been made:  Add HCTZ 12.15m daily.  Labs/ tests ordered today include:   Orders Placed This Encounter  Procedures  . Basic metabolic panel  . EKG 12-Lead     Disposition:   FU with Torrence Hammack C. ROval Linsey MD, [facility] months.  PharmD in 1 month.    Signed, Roxi Hlavaty C. ROval Linsey MD, FLang[facility] Center 02/06/2018 4:17 PM    [facility]

## 2018-02-06 NOTE — Patient Instructions (Addendum)
Medication Instructions:  START LOSARTAN HCT 100-12.5 MG DAILY   STOP LOSARTAN PLAIN   Labwork: BMET IN 1 WEEK   Testing/Procedures: NONE  Follow-Up: Your physician recommends that you schedule a follow-up appointment in: [place] [facility]   Your physician recommends that you schedule a follow-up appointment in: [facility]   If you need a refill on your cardiac medications before your next appointment, please call your pharmacy.

## 2018-02-13 DIAGNOSIS — I708 Atherosclerosis of other arteries: Secondary | ICD-10-CM | POA: Diagnosis not present

## 2018-02-13 DIAGNOSIS — K50819 Crohn's disease of both small and large intestine with unspecified complications: Secondary | ICD-10-CM | POA: Diagnosis not present

## 2018-02-19 DIAGNOSIS — K5080 Crohn's disease of both small and large intestine without complications: Secondary | ICD-10-CM | POA: Diagnosis not present

## 2018-02-20 DIAGNOSIS — Z5181 Encounter for therapeutic drug level monitoring: Secondary | ICD-10-CM | POA: Diagnosis not present

## 2018-02-20 DIAGNOSIS — I10 Essential (primary) hypertension: Secondary | ICD-10-CM | POA: Diagnosis not present

## 2018-02-21 DIAGNOSIS — K5080 Crohn's disease of both small and large intestine without complications: Secondary | ICD-10-CM | POA: Diagnosis not present

## 2018-02-21 DIAGNOSIS — K50018 Crohn's disease of small intestine with other complication: Secondary | ICD-10-CM | POA: Diagnosis not present

## 2018-02-21 LAB — BASIC METABOLIC PANEL
BUN / CREAT RATIO: 15 (ref 9–23)
BUN: 11 mg/dL (ref 6–24)
CHLORIDE: 104 mmol/L (ref 96–106)
CO2: 26 mmol/L (ref 20–29)
Calcium: 9.4 mg/dL (ref 8.7–10.2)
Creatinine, Ser: 0.75 mg/dL (ref 0.57–1.00)
GFR calc Af Amer: 106 mL/min/{1.73_m2} (ref >59)
GFR calc non Af Amer: 92 mL/min/{1.73_m2} (ref >59)
GLUCOSE: 79 mg/dL (ref 65–99)
POTASSIUM: 4.2 mmol/L (ref 3.5–5.2)
Sodium: 142 mmol/L (ref 134–144)

## 2018-03-11 ENCOUNTER — Ambulatory Visit (INDEPENDENT_AMBULATORY_CARE_PROVIDER_SITE_OTHER): Payer: BLUE CROSS/BLUE SHIELD | Admitting: Pharmacist Clinician (PhC)/ Clinical Pharmacy Specialist

## 2018-03-11 DIAGNOSIS — I10 Essential (primary) hypertension: Secondary | ICD-10-CM

## 2018-03-11 MED ORDER — AMLODIPINE BESYLATE 5 MG PO TABS: 5.0000 mg | ORAL_TABLET | Freq: Every day | ORAL | 5 refills | Status: DC

## 2018-03-11 NOTE — Patient Instructions (Addendum)
Return for a a follow up appointment in 1 month _ July 11  Your blood pressure today is 140/96  (goal is < 130/80)  Check your blood pressure at home daily and keep record of the readings.  Take your BP meds as follows:  AM:   Metoprolol and losartan/hydrochlorothiazide  PM:  Metoprolol and amlodipine  Bring all of your meds, your BP cuff and your record of home blood pressures to your next appointment.  Exercise as you're able, try to walk approximately 30 minutes per day.  Keep salt intake to a minimum, especially watch canned and prepared boxed foods.  Eat more fresh fruits and vegetables and fewer canned items.  Avoid eating in fast food restaurants.    HOW TO TAKE YOUR BLOOD PRESSURE: . Rest 5 minutes before taking your blood pressure. .  Don't smoke or drink caffeinated beverages for at least 30 minutes before. . Take your blood pressure before (not after) you eat. . Sit comfortably with your back supported and both feet on the floor (don't cross your legs). . Elevate your arm to heart level on a table or a desk. . Use the proper sized cuff. It should fit smoothly and snugly around your bare upper arm. There should be enough room to slip a fingertip under the cuff. The bottom edge of the cuff should be 1 inch above the crease of the elbow. . Ideally, take 3 measurements at one sitting and record the average.

## 2018-03-11 NOTE — Progress Notes (Signed)
03/12/2018 Melody Gardner 09/02/1966 [id number]   HPI:  Melody Gardner is a 52 y.o. female patient of Dr Oval Linsey , with a PMH below who presents today for hypertension clinic evaluation.  In addition to hypertension, her medical history is significant for renal artery stenosis (post stent approx 12 yrs ago), cervical ICA fibromuscular dysplasia and Crohn's disease.    Today she comes in feeling well.  She has not checked her blood pressure at home in the past couple of weeks, but after seeing Dr. Oval Linsey she checked it several times and reports that the systolic reading was in the 130-140 range.  She does not recall the diastolic, other than it was < 100.   She has not complaints of chest pain, shortness of breath, dizziness with positional changes or lower extremity edema.  States that she takes her medications daily without issue.    Blood Pressure Goal:  130/80  Current Medications:  Losartan hctz 100/12.5 - am  Metoprolol 25 mg bid             Family Hx:  Father died complications of diabetes/heart disease 74  Mother living now 47, with hypertension  One of 48 siblings, 11 still living, several with hypertension  1 brother deceased DM, no siblings with strokes/MI  One brother with rheumatic fever and "hole in the heart"  2 children in their 36's, no problems  Social Hx:  No tobacco, occasional glass of wine; no coffee, some soda (coke/pepsi) 8oz cans every 2-4 days  Diet:   mix of home/eating out, watches salt intake, tries to avoid canned veggies;  Rare fried foods, mostly baked foods, trying to wean herself from bacon/pork  Exercise:  Gym most days of the week; treadmill, bicycle, eliptical, weight resistance; 2 hours - gym at [employer] where she works  Home BP readings:  Home cuff for several years; [employer] brand, arm cuff, has not checked in last week or two  Intolerances:   Sulfa based compounds  Labs:  02/20/18:  Na 142, K 4.2, Glu 79, BUN 11 SCr 0.75  (CrCl  105.6)  Wt Readings from Last 3 Encounters:  02/06/18 168 lb (76.2 kg)  10/28/17 162 lb 9.6 oz (73.8 kg)  10/14/17 160 lb (72.6 kg)   BP Readings from Last 3 Encounters:  03/12/18 (!) 140/96  02/06/18 (!) 138/100  10/28/17 136/86   Pulse Readings from Last 3 Encounters:  03/12/18 72  02/06/18 76  10/28/17 75    Current Outpatient Medications  Medication Sig Dispense Refill  . amLODipine (NORVASC) 5 MG tablet Take 1 tablet (5 mg total) by mouth daily. 30 tablet 5  . aspirin EC 81 MG tablet Take 1 tablet (81 mg total) by mouth daily. 30 tablet 12  . azaTHIOprine (IMURAN) 50 MG tablet Take 50 mg by mouth 3 (three) times daily.    . balsalazide (COLAZAL) 750 MG capsule Take 2,250 mg by mouth 3 (three) times daily.    . fluticasone (FLONASE) 50 MCG/ACT nasal spray SHAKE LIQUID AND USE 2 SPRAYS IN EACH NOSTRIL DAILY 16 g 0  . losartan-hydrochlorothiazide (HYZAAR) 100-12.5 MG tablet Take 1 tablet by mouth daily. 30 tablet 5  . metoprolol tartrate (LOPRESSOR) 25 MG tablet Take 1 tablet (25 mg total) by mouth 2 (two) times daily. 60 tablet 2  . Multiple Vitamins-Minerals (WOMENS ONE DAILY PO) Take 1 tablet by mouth daily.    . nitroGLYCERIN (NITROSTAT) 0.4 MG SL tablet Place 1 tablet (0.4 mg total) under  the tongue every 5 (five) minutes as needed for chest pain. 25 tablet 1   No current facility-administered medications for this visit.     Allergies  Allergen Reactions  . Folic Acid Rash    Pt declines this allergy  . Sulfa Antibiotics Rash  . Sulfasalazine Itching and Rash    Past Medical History:  Diagnosis Date  . Acute MI, lateral wall, initial episode of care ([place]) 12/18/2015  . Anemia 2013  . Crohn's disease ([place])   . Depression    pt states she is depressed but not medically diagnosed  . Hypertension 2012  . Hypertensive heart disease 12/19/2015    Blood pressure (!) 140/96, pulse 72.  HTN (hypertension) Patient with mixed systolic/diastolic hypertension.  Will add  amlodipine 5 mg once daily in the evenings.  She is to continue the losartan/hctz and metoprolol.  Explained the need to check home BP daily and watch both numbers.  Patient was taught proper home BP technique and will return in a month.  She should bring the home meter as well as a list of her readings.  Praised her on exercise regimen.     Tommy Medal PharmD CPP [facility] [address] [place], [place] [address] [phone]

## 2018-03-12 ENCOUNTER — Encounter: Payer: Self-pay | Admitting: Pharmacist Clinician (PhC)/ Clinical Pharmacy Specialist

## 2018-03-12 NOTE — Assessment & Plan Note (Addendum)
Patient with mixed systolic/diastolic hypertension.  Will add amlodipine 5 mg once daily in the evenings.  She is to continue the losartan/hctz and metoprolol.  Explained the need to check home BP daily and watch both numbers.  Patient was taught proper home BP technique and will return in a month.  She should bring the home meter as well as a list of her readings.  Praised her on exercise regimen.

## 2018-03-30 ENCOUNTER — Other Ambulatory Visit: Payer: Self-pay | Admitting: Cardiovascular Disease

## 2018-04-01 DIAGNOSIS — R9389 Abnormal findings on diagnostic imaging of other specified body structures: Secondary | ICD-10-CM | POA: Insufficient documentation

## 2018-04-01 DIAGNOSIS — K559 Vascular disorder of intestine, unspecified: Secondary | ICD-10-CM | POA: Diagnosis not present

## 2018-04-10 ENCOUNTER — Ambulatory Visit (INDEPENDENT_AMBULATORY_CARE_PROVIDER_SITE_OTHER): Payer: BLUE CROSS/BLUE SHIELD | Admitting: Pharmacist Clinician (PhC)/ Clinical Pharmacy Specialist

## 2018-04-10 ENCOUNTER — Encounter: Payer: Self-pay | Admitting: Pharmacist Clinician (PhC)/ Clinical Pharmacy Specialist

## 2018-04-10 DIAGNOSIS — I10 Essential (primary) hypertension: Secondary | ICD-10-CM

## 2018-04-10 MED ORDER — AMLODIPINE BESYLATE 10 MG PO TABS: 10.0000 mg | ORAL_TABLET | Freq: Every day | ORAL | 3 refills | Status: DC

## 2018-04-10 NOTE — Patient Instructions (Signed)
Return for a a follow up appointment in 1 month  Your blood pressure today is 136/96  (goal 130/80)  Check your blood pressure at home daily and keep record of the readings.  Take your BP meds as follows:   Increase amlodipine to 10 mg each night (take 2 of the 5 mg tabs until gone)  Continue with all other medications  Bring all of your meds, your BP cuff and your record of home blood pressures to your next appointment.  Exercise as you're able, try to walk approximately 30 minutes per day.  Keep salt intake to a minimum, especially watch canned and prepared boxed foods.  Eat more fresh fruits and vegetables and fewer canned items.  Avoid eating in fast food restaurants.    HOW TO TAKE YOUR BLOOD PRESSURE: . Rest 5 minutes before taking your blood pressure. .  Don't smoke or drink caffeinated beverages for at least 30 minutes before. . Take your blood pressure before (not after) you eat. . Sit comfortably with your back supported and both feet on the floor (don't cross your legs). . Elevate your arm to heart level on a table or a desk. . Use the proper sized cuff. It should fit smoothly and snugly around your bare upper arm. There should be enough room to slip a fingertip under the cuff. The bottom edge of the cuff should be 1 inch above the crease of the elbow. . Ideally, take 3 measurements at one sitting and record the average.

## 2018-04-10 NOTE — Progress Notes (Signed)
04/11/2018 Melody Gardner Apr 09, 1966 [id number]   HPI:  Melody Gardner is a 52 y.o. female patient of Dr Oval Linsey , with a PMH below who presents today for hypertension clinic evaluation. In addition to hypertension, her medical history is significant for renal artery stenosis (post stent approx 12 yrs ago), cervical ICA fibromuscular dysplasia and Crohn's disease.    Today she is doing well.  She has no complaints of chest pain, shortness of breath, dizziness with positional changes or lower extremity edema.  States that she takes her medications daily without issue.  Her systolic pressure has come down, but the diastolic still tends to be > 90  Blood Pressure Goal:  130/80  Current Medications:  Losartan hctz 100/12.5 - am  Metoprolol 25 mg bid  Amlodipine 5 mg - pm             Family Hx:  Father died complications of diabetes/heart disease 67  Mother living now 29, with hypertension  One of 76 siblings, 41 still living, several with hypertension  1 brother deceased DM, no siblings with strokes/MI  One brother with rheumatic fever and "hole in the heart"  2 children in their 60's, no problems  Social Hx:  No tobacco, occasional glass of wine; no coffee, some soda (coke/pepsi) 8oz cans every 2-4 days  Diet:   mix of home/eating out, watches salt intake, tries to avoid canned veggies;  Rare fried foods, mostly baked foods, trying to wean herself from bacon/pork; eating more salads, uses light catalina, italian dressings  Exercise:  Gym most days of the week; treadmill, bicycle, eliptical, weight resistance; 2 hours - gym at [employer] where she works  Home BP readings:  Home cuff for several years; [employer] brand, arm cuff, 28 readings over past month, average 136/93  Intolerances:   Sulfa based compounds  Labs:  02/20/18:  Na 142, K 4.2, Glu 79, BUN 11 SCr 0.75  (CrCl 105.6)  Wt Readings from Last 3 Encounters:  02/06/18 168 lb (76.2 kg)  10/28/17 162 lb 9.6 oz (73.8 kg)    10/14/17 160 lb (72.6 kg)   BP Readings from Last 3 Encounters:  04/10/18 (!) 136/96  03/12/18 (!) 140/96  02/06/18 (!) 138/100   Pulse Readings from Last 3 Encounters:  04/10/18 78  03/12/18 72  02/06/18 76    Current Outpatient Medications  Medication Sig Dispense Refill  . amLODipine (NORVASC) 10 MG tablet Take 1 tablet (10 mg total) by mouth daily. 180 tablet 3  . aspirin EC 81 MG tablet Take 1 tablet (81 mg total) by mouth daily. 30 tablet 12  . azaTHIOprine (IMURAN) 50 MG tablet Take 50 mg by mouth 3 (three) times daily.    . balsalazide (COLAZAL) 750 MG capsule Take 2,250 mg by mouth 3 (three) times daily.    . fluticasone (FLONASE) 50 MCG/ACT nasal spray SHAKE LIQUID AND USE 2 SPRAYS IN EACH NOSTRIL DAILY 16 g 0  . losartan-hydrochlorothiazide (HYZAAR) 100-12.5 MG tablet Take 1 tablet by mouth daily. 30 tablet 5  . metoprolol tartrate (LOPRESSOR) 25 MG tablet TAKE 1 TABLET BY MOUTH TWICE DAILY 60 tablet 6  . Multiple Vitamins-Minerals (WOMENS ONE DAILY PO) Take 1 tablet by mouth daily.    . nitroGLYCERIN (NITROSTAT) 0.4 MG SL tablet Place 1 tablet (0.4 mg total) under the tongue every 5 (five) minutes as needed for chest pain. 25 tablet 1   No current facility-administered medications for this visit.     Allergies  Allergen Reactions  . Folic Acid Rash    Pt declines this allergy  . Sulfa Antibiotics Rash  . Sulfasalazine Itching and Rash    Past Medical History:  Diagnosis Date  . Acute MI, lateral wall, initial episode of care ([place]) 12/18/2015  . Anemia 2013  . Crohn's disease ([place])   . Depression    pt states she is depressed but not medically diagnosed  . Hypertension 2012  . Hypertensive heart disease 12/19/2015    Blood pressure (!) 136/96, pulse 78.  HTN (hypertension) Patient with continued elevated blood pressure.  Will have her increase amlodipine to 10 mg daily and continue all other medications.  Return in 1 month for follow up.  At that time  would consider switching losartan/hctz to valsartan and chlorthalidone for maximum benefit before considering the addition of spironolactone.  Patient to continue monitoring home readings, and was praised on her continued exercise regimen.     Tommy Medal PharmD CPP [facility] [address] [place], [place] [address] [phone]

## 2018-04-11 NOTE — Assessment & Plan Note (Signed)
Patient with continued elevated blood pressure.  Will have her increase amlodipine to 10 mg daily and continue all other medications.  Return in 1 month for follow up.  At that time would consider switching losartan/hctz to valsartan and chlorthalidone for maximum benefit before considering the addition of spironolactone.  Patient to continue monitoring home readings, and was praised on her continued exercise regimen.

## 2018-04-17 ENCOUNTER — Other Ambulatory Visit: Payer: Self-pay | Admitting: Family Medicine

## 2018-04-17 DIAGNOSIS — J0100 Acute maxillary sinusitis, unspecified: Secondary | ICD-10-CM

## 2018-05-06 ENCOUNTER — Ambulatory Visit (INDEPENDENT_AMBULATORY_CARE_PROVIDER_SITE_OTHER): Payer: BLUE CROSS/BLUE SHIELD | Admitting: Pharmacist

## 2018-05-06 ENCOUNTER — Encounter: Payer: Self-pay | Admitting: Pharmacist

## 2018-05-06 VITALS — BP 110/78 | HR 74 | Ht 66.0 in | Wt 175.4 lb

## 2018-05-06 DIAGNOSIS — I10 Essential (primary) hypertension: Secondary | ICD-10-CM

## 2018-05-06 NOTE — Patient Instructions (Addendum)
Return for a  follow up appointment in 5 weeks with DR Oval Linsey  Check your blood pressure at home daily (if able) and keep record of the readings.  Take your BP meds as follows: *NO changes in medication*  Bring all of your meds, your BP cuff and your record of home blood pressures to your next appointment.  Exercise as you're able, try to walk approximately 30 minutes per day.  Keep salt intake to a minimum, especially watch canned and prepared boxed foods.  Eat more fresh fruits and vegetables and fewer canned items.  Avoid eating in fast food restaurants.    HOW TO TAKE YOUR BLOOD PRESSURE: . Rest 5 minutes before taking your blood pressure. .  Don't smoke or drink caffeinated beverages for at least 30 minutes before. . Take your blood pressure before (not after) you eat. . Sit comfortably with your back supported and both feet on the floor (don't cross your legs). . Elevate your arm to heart level on a table or a desk. . Use the proper sized cuff. It should fit smoothly and snugly around your bare upper arm. There should be enough room to slip a fingertip under the cuff. The bottom edge of the cuff should be 1 inch above the crease of the elbow. . Ideally, take 3 measurements at one sitting and record the average.

## 2018-05-06 NOTE — Progress Notes (Signed)
HPI:  Melody Gardner is a 52 y.o. female patient of Dr Oval Linsey , with a PMH below who presents today for hypertension clinic evaluation. In addition to hypertension, her medical history is significant for renal artery stenosis (post stent approx 12 yrs ago), cervical ICA fibromuscular dysplasia and Crohn's disease.    Today she is doing well.  She has no complaints of chest pain or shortness of breath. Reports rare dizziness with positional changes and some lower extremity edema in the afternoons.  Patient stated her edema is resolved in the mornings and get wort during the day. States that she takes her medications daily without issue.  Blood Pressure Goal:  130/80  Current Medications:  Losartan hctz 100/12.5 daily- am  Metoprolol 25 mg twice daily  Amlodipine 2m daily- pm             Family Hx:  Father died complications of diabetes/heart disease 745 Mother living now 957 with hypertension  One of 129siblings, 170still living, several with hypertension  1 brother deceased DM, no siblings with strokes/MI  One brother with rheumatic fever and "hole in the heart"  2 children in their 356's no problems  Social Hx:  No tobacco, occasional glass of wine; no coffee, some soda (coke/pepsi) 8oz cans every 2-4 days  Diet:   mix of home/eating out, watches salt intake, tries to avoid canned veggies;  Rare fried foods, mostly baked foods, trying to wean herself from bacon/pork; eating more salads, uses light catalina, italian dressings  Exercise:  Gym most days of the week; treadmill, bicycle, eliptical, weight resistance; 2 hours - [employer]  Home BP readings:   17 readings; average 105/76 (HR 74-97bpm)   Home cuff for several years; [employer] brand, arm cuff (accurate within 147mHg)  Labs:  02/20/18:  Na 142, K 4.2, Glu 79, BUN 11 SCr 0.75  (CrCl 105.6)  Wt Readings from Last 3 Encounters:  05/06/18 175 lb 6.4 oz (79.6 kg)  02/06/18 168 lb (76.2 kg)    10/28/17 162 lb 9.6 oz (73.8 kg)   BP Readings from Last 3 Encounters:  05/06/18 110/78  04/10/18 (!) 136/96  03/12/18 (!) 140/96   Pulse Readings from Last 3 Encounters:  05/06/18 74  04/10/18 78  03/12/18 72    Current Outpatient Medications  Medication Sig Dispense Refill  . amLODipine (NORVASC) 10 MG tablet Take 1 tablet (10 mg total) by mouth daily. 180 tablet 3  . aspirin EC 81 MG tablet Take 1 tablet (81 mg total) by mouth daily. 30 tablet 12  . azaTHIOprine (IMURAN) 50 MG tablet Take 50 mg by mouth 3 (three) times daily.    . balsalazide (COLAZAL) 750 MG capsule Take 2,250 mg by mouth 3 (three) times daily.    . fluticasone (FLONASE) 50 MCG/ACT nasal spray SHAKE LIQUID AND USE 2 SPRAYS IN EACH NOSTRIL DAILY 16 g 0  . losartan-hydrochlorothiazide (HYZAAR) 100-12.5 MG tablet Take 1 tablet by mouth daily. 30 tablet 5  . metoprolol tartrate (LOPRESSOR) 25 MG tablet TAKE 1 TABLET BY MOUTH TWICE DAILY 60 tablet 6  . Multiple Vitamins-Minerals (WOMENS ONE DAILY PO) Take 1 tablet by mouth daily.    . nitroGLYCERIN (NITROSTAT) 0.4 MG SL tablet Place 1 tablet (0.4 mg total) under the tongue every 5 (five) minutes as needed for chest pain. 25 tablet 1   No current facility-administered medications for this visit.     Allergies  Allergen Reactions  .  Folic Acid Rash    Pt declines this allergy  . Sulfa Antibiotics Rash  . Sulfasalazine Itching and Rash    Past Medical History:  Diagnosis Date  . Acute MI, lateral wall, initial episode of care ([place]) 12/18/2015  . Anemia 2013  . Crohn's disease ([place])   . Depression    pt states she is depressed but not medically diagnosed  . Hypertension 2012  . Hypertensive heart disease 12/19/2015    Blood pressure 110/78, pulse 74, height 5' 6"  (1.676 m), weight 175 lb 6.4 oz (79.6 kg).  HTN (hypertension) Blood pressure greatly improved since increasing amlodipine to 62m daily during last OV; however, the patient now reports low  extremity edema. Due to significant improvement in BP readings and daily resolution of edema, the patient will like to continue current medication without changes. Follow up with Dr ROval Linseyis already scheduled for 06/12/18. Patient plan to discuss any worsening of symptoms with cardiologist as needed.   Akim Watkinson Rodriguez-Guzman PharmD, BCPS, CCarter396 Third StreetGreensboro,[place] [id number]/05/2018 10:34 AM

## 2018-05-08 NOTE — Assessment & Plan Note (Signed)
Blood pressure greatly improved since increasing amlodipine to 21m daily during last OV; however, the patient now reports low extremity edema. Due to significant improvement in BP readings and daily resolution of edema, the patient will like to continue current medication without changes. Follow up with Dr ROval Linseyis already scheduled for 06/12/18. Patient plan to discuss any worsening of symptoms with cardiologist as needed.

## 2018-06-12 ENCOUNTER — Encounter: Payer: Self-pay | Admitting: *Deleted

## 2018-06-12 ENCOUNTER — Encounter: Payer: Self-pay | Admitting: Cardiovascular Disease

## 2018-06-12 ENCOUNTER — Ambulatory Visit: Payer: BLUE CROSS/BLUE SHIELD | Admitting: Cardiovascular Disease

## 2018-06-12 VITALS — BP 122/88 | HR 72 | Ht 66.0 in | Wt 172.4 lb

## 2018-06-12 DIAGNOSIS — I773 Arterial fibromuscular dysplasia: Secondary | ICD-10-CM | POA: Diagnosis not present

## 2018-06-12 DIAGNOSIS — I6523 Occlusion and stenosis of bilateral carotid arteries: Secondary | ICD-10-CM

## 2018-06-12 DIAGNOSIS — Z5181 Encounter for therapeutic drug level monitoring: Secondary | ICD-10-CM | POA: Diagnosis not present

## 2018-06-12 DIAGNOSIS — I10 Essential (primary) hypertension: Secondary | ICD-10-CM

## 2018-06-12 MED ORDER — LOSARTAN POTASSIUM-HCTZ 100-25 MG PO TABS: 1.0000 | ORAL_TABLET | Freq: Every day | ORAL | 1 refills | Status: DC

## 2018-06-12 MED ORDER — AMLODIPINE BESYLATE 5 MG PO TABS: 5.0000 mg | ORAL_TABLET | Freq: Every day | ORAL | 1 refills | Status: DC

## 2018-06-12 NOTE — Patient Instructions (Addendum)
Medication Instructions:  INCREASE YOUR LOSARTAN-HCTZ TO 100-25 MG  DAILY  DECREASE YOUR AMLODIPINE TO 5 MG DAILY 1/2 10 MG TABLETS UNTIL CURRENT BOTTLE EMPTY   Labwork: FASTING LP/CMET IN 1 WEEK   Testing/Procedures: CTA OF THE NECK   Follow-Up: Your physician recommends that you schedule a follow-up appointment in: 2 MONTHS   If you need a refill on your cardiac medications before your next appointment, please call your pharmacy.

## 2018-06-12 NOTE — Progress Notes (Signed)
Cardiology Office Note   Date:  06/12/2018   ID:  Melody Gardner, DOB 05-24-1966, MRN [id number]  PCP:  Carollee Herter, Alferd Apa, DO  Cardiologist:   Skeet Latch, MD   No chief complaint on file.    History of Present Illness: Melody Gardner is a 52 y.o. 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  Melody Gardner was admitted 12/18/15 with chest pain.  Cardiac enzymes were elevated and she had mild lateral ST elevation.  In the cath she was found to have a spontaneous dissection with 100% occlusion of D1 that was medically managed. Her echo showed LVEF 60-65% with grade 1 diastolic dysfunction.   Melody Gardner reports having her renal artery stented when she was around 48.   Given this history, there was concern for fibromuscular dysplasia.  She was referred for carotid ultrasound that revealed bilateral intimal thickening with 1-39% stenosis bilaterally but no plaque.   Melody Gardner underwent CT angiography of her whole body 03/2016.  She was found to have bilateral cervical ICA fibromuscular dysplasia.  Carotid ultrasound revealed 1-39% stenosis bilaterally.  She was evaluated by Dr. Scot Dock 07/04/16 and it was felt that no intervention was necessary. There was no FMD of the renal arteries.  She was also noted to have a 12 mm soft tissue nodule in the right neck suspicious for an ectopic parathyroid adenoma and dental caries.   At her last appointment HCTZ was added.  She followed up with the pharmacist and her blood pressure was still above goal so amlodipine was increased.  However she then developed lower extremity edema.  Overall she has been feeling well.  She goes to the gym 3 or 4 times per week.  She does the treadmill, rides a bike, and the weight machines.  She feels good with exertion.  Since making the above changes her blood pressure has been very  well-controlled.  It is been mostly in the 90s to 110s over 60s to 31s.  Her edema is not bad but she does still have some at times.  She has no orthopnea or PND.  She had a couple episodes of chest pain, however never with exertion and she thinks it is from eating too fast.  She takes Tums and this seems to help.   Past Medical History:  Diagnosis Date  . Acute MI, lateral wall, initial episode of care ([place]) 12/18/2015  . Anemia 2013  . Crohn's disease ([place])   . Depression    pt states she is depressed but not medically diagnosed  . Hypertension 2012  . Hypertensive heart disease 12/19/2015    Past Surgical History:  Procedure Laterality Date  . CARDIAC CATHETERIZATION N/A 12/18/2015   Procedure: Left Heart Cath and Coronary Angiography;  Surgeon: Sherren Mocha, MD;  Location: [facility];  Service: Cardiovascular;  Laterality: N/A;  . ENDOMETRIAL ABLATION  2013  . LAPAROSCOPY FOR ECTOPIC PREGNANCY    . RENAL ARTERY STENT    . TUBAL LIGATION  1989     Current Outpatient Medications  Medication Sig Dispense Refill  . amLODipine (NORVASC) 5 MG tablet Take 1 tablet (5 mg total) by mouth daily. 90 tablet 1  . aspirin EC 81 MG tablet Take 1 tablet (81 mg total) by mouth daily. 30 tablet 12  . azaTHIOprine (IMURAN) 50 MG tablet Take 50 mg by mouth 3 (three) times  daily.    . balsalazide (COLAZAL) 750 MG capsule Take 2,250 mg by mouth 3 (three) times daily.    . fluticasone (FLONASE) 50 MCG/ACT nasal spray SHAKE LIQUID AND USE 2 SPRAYS IN EACH NOSTRIL DAILY 16 g 0  . metoprolol tartrate (LOPRESSOR) 25 MG tablet TAKE 1 TABLET BY MOUTH TWICE DAILY 60 tablet 6  . Multiple Vitamins-Minerals (WOMENS ONE DAILY PO) Take 1 tablet by mouth daily.    . nitroGLYCERIN (NITROSTAT) 0.4 MG SL tablet Place 1 tablet (0.4 mg total) under the tongue every 5 (five) minutes as needed for chest pain. 25 tablet 1  . losartan-hydrochlorothiazide (HYZAAR) 100-25 MG tablet Take 1 tablet by mouth daily. 90  tablet 1   No current facility-administered medications for this visit.     Allergies:   Folic acid; Sulfa antibiotics; and Sulfasalazine    Social History:  The patient  reports that she has never smoked. She has never used smokeless tobacco. She reports that she does not drink alcohol or use drugs.   Family History:  The patient's family history includes Cancer in her brother; Diabetes in her brother and father; Hyperlipidemia in her father; Hypertension in her mother; Neuropathy in her brother and father; Stroke in her father.    ROS:  Please see the history of present illness.   Otherwise, review of systems are positive for none.   All other systems are reviewed and negative.    PHYSICAL EXAM: VS:  BP 122/88   Pulse 72   Ht 5' 6"  (1.676 m)   Wt 172 lb 6.4 oz (78.2 kg)   SpO2 98%   BMI 27.83 kg/m  , BMI Body mass index is 27.83 kg/m. GENERAL:  Well appearing HEENT: Pupils equal round and reactive, fundi not visualized, oral mucosa unremarkable NECK:  No jugular venous distention, waveform within normal limits, carotid upstroke brisk and symmetric, R carotid bruit LUNGS:  Clear to auscultation bilaterally HEART:  RRR.  PMI not displaced or sustained,S1 and S2 within normal limits, no S3, no S4, no clicks, no rubs, no murmurs ABD:  Flat, positive bowel sounds normal in frequency in pitch, no bruits, no rebound, no guarding, no midline pulsatile mass, no hepatomegaly, no splenomegaly EXT:  2 plus pulses throughout, no edema, no cyanosis no clubbing SKIN:  No rashes no nodules NEURO:  Cranial nerves II through XII grossly intact, motor grossly intact throughout PSYCH:  Cognitively intact, oriented to person place and time   EKG:  EKG is not ordered today. 04/26/16: Sinus rhythm rate 79 bpm.  LVH 02/06/18: Sinus rhythm.  Rate 76 bpm.    LHC 12/18/15: 100% D1 due to spontaneous dissection.  No other coronary artery disease.  Echo 12/19/15: Study Conclusions  - Left ventricle:  The cavity size was normal. Wall thickness was  normal. Systolic function was normal. The estimated ejection  fraction was in the range of 60% to 65%. Mildly abnormal GLPSS at  -17%, more regional inferior strain. Wall motion was normal;  there were no regional wall motion abnormalities. Doppler  parameters are consistent with abnormal left ventricular  relaxation (grade 1 diastolic dysfunction). The E/e&' ratio is  between 8-15, suggesting indeterminate LV filling pressure. - Left atrium: The atrium was normal in size. - Tricuspid valve: There was no significant regurgitation. - Inferior vena cava: The vessel was normal in size. The  respirophasic diameter changes were in the normal range (= 50%),  consistent with normal central venous pressure.  Impressions:  - LVEF  60-65%, normal wall thickness, normal wall motion, mildly  abnormal GLPSS at -17% (inferior), Grade 1 DD with indeterminate  LV filling pressure, normal chamber sizes, normal IVC.  Carotid Doppler 01/12/16: Intimal thickening bilaterally.  1-39% ICA stenosis bilaterally.    Recent Labs: 10/14/2017: ALT 14; Hemoglobin 13.2; Platelets 361 02/20/2018: BUN 11; Creatinine, Ser 0.75; Potassium 4.2; Sodium 142    Lipid Panel    Component Value Date/Time   CHOL 124 06/12/2016 1442   TRIG 53.0 06/12/2016 1442   HDL 44.70 06/12/2016 1442   CHOLHDL 3 06/12/2016 1442   VLDL 10.6 06/12/2016 1442   LDLCALC 69 06/12/2016 1442      Wt Readings from Last 3 Encounters:  06/12/18 172 lb 6.4 oz (78.2 kg)  05/06/18 175 lb 6.4 oz (79.6 kg)  02/06/18 168 lb (76.2 kg)      ASSESSMENT AND PLAN:  # Spontaneous coronary artery dissection:  # Fibromuscular dysplasia: # ICA stenosis: Melody Gardner had SCAD of D1 and has bilateral ICA stenosis.  It has been 2 years since she had her carotids assessed.  She has a bruit on the right.  We will repeat chest CT-A.  She is doing well and has no symptoms.  Continue aspirin and  metoprolol.    # Hypertension:  Blood pressure is well-controlled.  However she has swelling on amlodipine.  We will reduce this back to 10 mg.  Increase her hydrochlorthiazide to 25 mg.  Check basic metabolic panel in 1 week.   Current medicines are reviewed at length with the patient today.  The patient does not have concerns regarding medicines.  The following changes have been made:  Increase HCTZ and reduce amlodipine.   Labs/ tests ordered today include:   Orders Placed This Encounter  Procedures  . CT ANGIO NECK W OR WO CONTRAST  . Lipid panel  . Comprehensive metabolic panel     Disposition:   FU with Alizey Noren C. Oval Linsey, MD, [facility] in 2 months.     Signed, Kezia Benevides C. Oval Linsey, MD, [facility]  06/12/2018 1:26 PM    [facility]

## 2018-06-17 ENCOUNTER — Other Ambulatory Visit: Payer: Self-pay | Admitting: Family Medicine

## 2018-06-17 ENCOUNTER — Encounter: Payer: Self-pay | Admitting: Family Medicine

## 2018-06-17 ENCOUNTER — Ambulatory Visit (INDEPENDENT_AMBULATORY_CARE_PROVIDER_SITE_OTHER): Payer: BLUE CROSS/BLUE SHIELD | Admitting: Family Medicine

## 2018-06-17 VITALS — BP 120/67 | HR 66 | Temp 98.6°F | Resp 16 | Ht 64.5 in | Wt 172.2 lb

## 2018-06-17 DIAGNOSIS — I10 Essential (primary) hypertension: Secondary | ICD-10-CM

## 2018-06-17 DIAGNOSIS — K50919 Crohn's disease, unspecified, with unspecified complications: Secondary | ICD-10-CM

## 2018-06-17 DIAGNOSIS — Z1231 Encounter for screening mammogram for malignant neoplasm of breast: Secondary | ICD-10-CM

## 2018-06-17 DIAGNOSIS — Z0000 Encounter for general adult medical examination without abnormal findings: Secondary | ICD-10-CM

## 2018-06-17 DIAGNOSIS — I119 Hypertensive heart disease without heart failure: Secondary | ICD-10-CM

## 2018-06-17 DIAGNOSIS — Z114 Encounter for screening for human immunodeficiency virus [HIV]: Secondary | ICD-10-CM | POA: Diagnosis not present

## 2018-06-17 DIAGNOSIS — I2129 ST elevation (STEMI) myocardial infarction involving other sites: Secondary | ICD-10-CM

## 2018-06-17 HISTORY — DX: Crohn's disease, unspecified, with unspecified complications: K50.919

## 2018-06-17 LAB — CBC WITH DIFFERENTIAL/PLATELET
Basophils Absolute: 0 10*3/uL (ref 0.0–0.1)
Basophils Relative: 0.7 % (ref 0.0–3.0)
Eosinophils Absolute: 0.1 10*3/uL (ref 0.0–0.7)
Eosinophils Relative: 1.9 % (ref 0.0–5.0)
HCT: 37.7 % (ref 36.0–46.0)
Hemoglobin: 12.8 g/dL (ref 12.0–15.0)
LYMPHS ABS: 1.1 10*3/uL (ref 0.7–4.0)
LYMPHS PCT: 20.9 % (ref 12.0–46.0)
MCHC: 33.9 g/dL (ref 30.0–36.0)
MCV: 92 fl (ref 78.0–100.0)
MONO ABS: 0.4 10*3/uL (ref 0.1–1.0)
MONOS PCT: 6.7 % (ref 3.0–12.0)
NEUTROS ABS: 3.8 10*3/uL (ref 1.4–7.7)
NEUTROS PCT: 69.8 % (ref 43.0–77.0)
PLATELETS: 358 10*3/uL (ref 150.0–400.0)
RBC: 4.1 Mil/uL (ref 3.87–5.11)
RDW: 14.1 % (ref 11.5–15.5)
WBC: 5.4 10*3/uL (ref 4.0–10.5)

## 2018-06-17 LAB — COMPREHENSIVE METABOLIC PANEL
ALK PHOS: 62 U/L (ref 39–117)
ALT: 12 U/L (ref 0–35)
AST: 19 U/L (ref 0–37)
Albumin: 3.9 g/dL (ref 3.5–5.2)
BUN: 14 mg/dL (ref 6–23)
CHLORIDE: 102 meq/L (ref 96–112)
CO2: 27 meq/L (ref 19–32)
Calcium: 9.8 mg/dL (ref 8.4–10.5)
Creatinine, Ser: 0.76 mg/dL (ref 0.40–1.20)
GFR: 102.61 mL/min (ref >60.00)
GLUCOSE: 86 mg/dL (ref 70–99)
Potassium: 3.6 mEq/L (ref 3.5–5.1)
SODIUM: 138 meq/L (ref 135–145)
TOTAL PROTEIN: 7.2 g/dL (ref 6.0–8.3)
Total Bilirubin: 1.1 mg/dL (ref 0.2–1.2)

## 2018-06-17 LAB — TSH: TSH: 1.91 u[IU]/mL (ref 0.35–4.50)

## 2018-06-17 LAB — LIPID PANEL
CHOL/HDL RATIO: 3
Cholesterol: 147 mg/dL (ref 0–200)
HDL: 45.4 mg/dL (ref >39.00)
LDL CALC: 86 mg/dL (ref 0–99)
NONHDL: 101.92
Triglycerides: 78 mg/dL (ref 0.0–149.0)
VLDL: 15.6 mg/dL (ref 0.0–40.0)

## 2018-06-17 NOTE — Progress Notes (Signed)
Subjective:     Melody Gardner is a 52 y.o. female and is here for a comprehensive physical exam. The patient reports no problems.  Social History   Socioeconomic History  . Marital status: Married    Spouse name: Not on file  . Number of children: Not on file  . Years of education: Not on file  . Highest education level: Not on file  Occupational History    Comment: [employer]  Social Needs  . Financial resource strain: Not on file  . Food insecurity:    Worry: Not on file    Inability: Not on file  . Transportation needs:    Medical: Not on file    Non-medical: Not on file  Tobacco Use  . Smoking status: Never Smoker  . Smokeless tobacco: Never Used  Substance and Sexual Activity  . Alcohol use: No  . Drug use: No  . Sexual activity: Yes    Birth control/protection: Surgical  Lifestyle  . Physical activity:    Days per week: Not on file    Minutes per session: Not on file  . Stress: Not on file  Relationships  . Social connections:    Talks on phone: Not on file    Gets together: Not on file    Attends religious service: Not on file    Active member of club or organization: Not on file    Attends meetings of clubs or organizations: Not on file    Relationship status: Not on file  . Intimate partner violence:    Fear of current or ex partner: Not on file    Emotionally abused: Not on file    Physically abused: Not on file    Forced sexual activity: Not on file  Other Topics Concern  . Not on file  Social History Narrative  . Not on file   Health Maintenance  Topic Date Due  . HIV Screening  01/18/1981  . MAMMOGRAM  06/12/2017  . INFLUENZA VACCINE  02/04/2020 (Originally 05/01/2018)  . PAP SMEAR  12/14/2019  . TETANUS/TDAP  01/07/2024  . COLONOSCOPY  09/22/2025    The following portions of the patient's history were reviewed and updated as appropriate:  She  has a past medical history of Acute MI, lateral wall, initial episode of care [facility]) (12/18/2015),  Anemia (2013), Crohn's disease ([place]), Depression, Hypertension (2012), and Hypertensive heart disease (12/19/2015). She does not have any pertinent problems on file. She  has a past surgical history that includes Renal artery stent; Laparoscopy for ectopic pregnancy; Tubal ligation (1989); Endometrial ablation (2013); and Cardiac catheterization (N/A, 12/18/2015). Her family history includes Cancer in her brother; Diabetes in her brother and father; Hyperlipidemia in her father; Hypertension in her mother; Neuropathy in her brother and father; Stroke in her father. She  reports that she has never smoked. She has never used smokeless tobacco. She reports that she does not drink alcohol or use drugs. She has a current medication list which includes the following prescription(s): amlodipine, aspirin ec, azathioprine, balsalazide, fluticasone, losartan-hydrochlorothiazide, metoprolol tartrate, multiple vitamins-minerals, and nitroglycerin. Current Outpatient Medications on File Prior to Visit  Medication Sig Dispense Refill  . amLODipine (NORVASC) 5 MG tablet Take 1 tablet (5 mg total) by mouth daily. 90 tablet 1  . aspirin EC 81 MG tablet Take 1 tablet (81 mg total) by mouth daily. 30 tablet 12  . azaTHIOprine (IMURAN) 50 MG tablet Take 50 mg by mouth 3 (three) times daily.    Marland Kitchen  balsalazide (COLAZAL) 750 MG capsule Take 2,250 mg by mouth 3 (three) times daily.    . fluticasone (FLONASE) 50 MCG/ACT nasal spray SHAKE LIQUID AND USE 2 SPRAYS IN EACH NOSTRIL DAILY 16 g 0  . losartan-hydrochlorothiazide (HYZAAR) 100-25 MG tablet Take 1 tablet by mouth daily. 90 tablet 1  . metoprolol tartrate (LOPRESSOR) 25 MG tablet TAKE 1 TABLET BY MOUTH TWICE DAILY 60 tablet 6  . Multiple Vitamins-Minerals (WOMENS ONE DAILY PO) Take 1 tablet by mouth daily.    . nitroGLYCERIN (NITROSTAT) 0.4 MG SL tablet Place 1 tablet (0.4 mg total) under the tongue every 5 (five) minutes as needed for chest pain. 25 tablet 1   No  current facility-administered medications on file prior to visit.    She is allergic to folic acid; sulfa antibiotics; and sulfasalazine..  Review of Systems Review of Systems  Constitutional: Negative for activity change, appetite change and fatigue.  HENT: Negative for hearing loss, congestion, tinnitus and ear discharge.  dentist [id number]: Negative for visual disturbance (see optho q1y -- vision corrected to 20/20 with glasses).  Respiratory: Negative for cough, chest tightness and shortness of breath.   Cardiovascular: Negative for chest pain, palpitations and leg swelling.  Gastrointestinal: Negative for abdominal pain, diarrhea, constipation and abdominal distention.  Genitourinary: Negative for urgency, frequency, decreased urine volume and difficulty urinating.  Musculoskeletal: Negative for back pain, arthralgias and gait problem.  Skin: Negative for color change, pallor and rash.  Neurological: Negative for dizziness, light-headedness, numbness and headaches.  Hematological: Negative for adenopathy. Does not bruise/bleed easily.  Psychiatric/Behavioral: Negative for suicidal ideas, confusion, sleep disturbance, self-injury, dysphoric mood, decreased concentration and agitation.       Objective:    BP 120/67 (BP Location: Left Arm, Cuff Size: Normal)   Pulse 66   Temp 98.6 F (37 C) (Oral)   Resp 16   Ht 5' 4.5" (1.638 m)   Wt 172 lb 3.2 oz (78.1 kg)   LMP 06/17/2018   SpO2 100%   BMI 29.10 kg/m  General appearance: alert, cooperative, appears stated age and no distress Head: Normocephalic, without obvious abnormality, atraumatic Eyes: conjunctivae/corneas clear. PERRL, EOM's intact. Fundi benign. Ears: normal TM's and external ear canals both ears Nose: Nares normal. Septum midline. Mucosa normal. No drainage or sinus tenderness. Throat: lips, mucosa, and tongue normal; teeth and gums normal Neck: no adenopathy, no carotid bruit, no JVD, supple, symmetrical,  trachea midline and thyroid not enlarged, symmetric, no tenderness/mass/nodules Back: symmetric, no curvature. ROM normal. No CVA tenderness. Lungs: clear to auscultation bilaterally Breasts: normal appearance, no masses or tenderness Heart: regular rate and rhythm, S1, S2 normal, no murmur, click, rub or gallop Abdomen: soft, non-tender; bowel sounds normal; no masses,  no organomegaly Pelvic: deferred Extremities: extremities normal, atraumatic, no cyanosis or edema Pulses: 2+ and symmetric Skin: Skin color, texture, turgor normal. No rashes or lesions Lymph nodes: Cervical, supraclavicular, and axillary nodes normal. Neurologic: Alert and oriented X 3, normal strength and tone. Normal symmetric reflexes. Normal coordination and gait    Assessment:    Healthy female exam.      Plan:    ghm utd Check labs  See After Visit Summary for Counseling Recommendations    1. Essential hypertension Well controlled, no changes to meds. Encouraged heart healthy diet such as the DASH diet and exercise as tolerated.  - CBC with Differential/Platelet - Comprehensive metabolic panel  2. Encounter for screening for HIV  - HIV Antibody (routine testing w rflx)  3. Preventative health care See above - CBC with Differential/Platelet - Comprehensive metabolic panel - Lipid panel - TSH  4. Crohn's disease with complication, unspecified gastrointestinal tract location [facility]) Per GI -- [place]  5. Acute MI, lateral wall, initial episode of care [facility] Per cardiology  6. Hypertensive heart disease without heart failure

## 2018-06-17 NOTE — Assessment & Plan Note (Signed)
Per GI ---[place]

## 2018-06-17 NOTE — Assessment & Plan Note (Signed)
Per cardiology No chest pain / sob

## 2018-06-17 NOTE — Assessment & Plan Note (Addendum)
Stable  con't meds Well controlled, no changes to meds. Encouraged heart healthy diet such as the DASH diet and exercise as tolerated.

## 2018-06-17 NOTE — Patient Instructions (Signed)
Preventive Care 40-64 Years, Female Preventive care refers to lifestyle choices and visits with your health care provider that can promote health and wellness. What does preventive care include?  A yearly physical exam. This is also called an annual well check.  Dental exams once or twice a year.  Routine eye exams. Ask your health care provider how often you should have your eyes checked.  Personal lifestyle choices, including: ? Daily care of your teeth and gums. ? Regular physical activity. ? Eating a healthy diet. ? Avoiding tobacco and drug use. ? Limiting alcohol use. ? Practicing safe sex. ? Taking low-dose aspirin daily starting at age 58. ? Taking vitamin and mineral supplements as recommended by your health care provider. What happens during an annual well check? The services and screenings done by your health care provider during your annual well check will depend on your age, overall health, lifestyle risk factors, and family history of disease. Counseling Your health care provider may ask you questions about your:  Alcohol use.  Tobacco use.  Drug use.  Emotional well-being.  Home and relationship well-being.  Sexual activity.  Eating habits.  Work and work Statistician.  Method of birth control.  Menstrual cycle.  Pregnancy history.  Screening You may have the following tests or measurements:  Height, weight, and BMI.  Blood pressure.  Lipid and cholesterol levels. These may be checked every 5 years, or more frequently if you are over 81 years old.  Skin check.  Lung cancer screening. You may have this screening every year starting at age 78 if you have a 30-pack-year history of smoking and currently smoke or have quit within the past 15 years.  Fecal occult blood test (FOBT) of the stool. You may have this test every year starting at age 65.  Flexible sigmoidoscopy or colonoscopy. You may have a sigmoidoscopy every 5 years or a colonoscopy  every 10 years starting at age 30.  Hepatitis C blood test.  Hepatitis B blood test.  Sexually transmitted disease (STD) testing.  Diabetes screening. This is done by checking your blood sugar (glucose) after you have not eaten for a while (fasting). You may have this done every 1-3 years.  Mammogram. This may be done every 1-2 years. Talk to your health care provider about when you should start having regular mammograms. This may depend on whether you have a family history of breast cancer.  BRCA-related cancer screening. This may be done if you have a family history of breast, ovarian, tubal, or peritoneal cancers.  Pelvic exam and Pap test. This may be done every 3 years starting at age 80. Starting at age 36, this may be done every 5 years if you have a Pap test in combination with an HPV test.  Bone density scan. This is done to screen for osteoporosis. You may have this scan if you are at high risk for osteoporosis.  Discuss your test results, treatment options, and if necessary, the need for more tests with your health care provider. Vaccines Your health care provider may recommend certain vaccines, such as:  Influenza vaccine. This is recommended every year.  Tetanus, diphtheria, and acellular pertussis (Tdap, Td) vaccine. You may need a Td booster every 10 years.  Varicella vaccine. You may need this if you have not been vaccinated.  Zoster vaccine. You may need this after age 5.  Measles, mumps, and rubella (MMR) vaccine. You may need at least one dose of MMR if you were born in  1957 or later. You may also need a second dose.  Pneumococcal 13-valent conjugate (PCV13) vaccine. You may need this if you have certain conditions and were not previously vaccinated.  Pneumococcal polysaccharide (PPSV23) vaccine. You may need one or two doses if you smoke cigarettes or if you have certain conditions.  Meningococcal vaccine. You may need this if you have certain  conditions.  Hepatitis A vaccine. You may need this if you have certain conditions or if you travel or work in places where you may be exposed to hepatitis A.  Hepatitis B vaccine. You may need this if you have certain conditions or if you travel or work in places where you may be exposed to hepatitis B.  Haemophilus influenzae type b (Hib) vaccine. You may need this if you have certain conditions.  Talk to your health care provider about which screenings and vaccines you need and how often you need them. This information is not intended to replace advice given to you by your health care provider. Make sure you discuss any questions you have with your health care provider. Document Released: 10/14/2015 Document Revised: 06/06/2016 Document Reviewed: 07/19/2015 Elsevier Interactive Patient Education  2018 [employer]  

## 2018-06-18 LAB — HIV ANTIBODY (ROUTINE TESTING W REFLEX): HIV 1&2 Ab, 4th Generation: NONREACTIVE

## 2018-06-24 ENCOUNTER — Ambulatory Visit (HOSPITAL_BASED_OUTPATIENT_CLINIC_OR_DEPARTMENT_OTHER)
Admission: RE | Admit: 2018-06-24 | Discharge: 2018-06-24 | Disposition: A | Discharge status: Home / Self Care | Payer: BLUE CROSS/BLUE SHIELD | Source: Ambulatory Visit | Attending: Family Medicine | Admitting: Family Medicine

## 2018-06-24 DIAGNOSIS — Z1231 Encounter for screening mammogram for malignant neoplasm of breast: Secondary | ICD-10-CM | POA: Diagnosis not present

## 2018-06-26 ENCOUNTER — Ambulatory Visit (INDEPENDENT_AMBULATORY_CARE_PROVIDER_SITE_OTHER)
Admission: RE | Admit: 2018-06-26 | Discharge: 2018-06-26 | Disposition: A | Discharge status: Home / Self Care | Payer: BLUE CROSS/BLUE SHIELD | Source: Ambulatory Visit | Attending: Cardiovascular Disease | Admitting: Cardiovascular Disease

## 2018-06-26 DIAGNOSIS — I773 Arterial fibromuscular dysplasia: Secondary | ICD-10-CM | POA: Diagnosis not present

## 2018-06-26 DIAGNOSIS — I6523 Occlusion and stenosis of bilateral carotid arteries: Secondary | ICD-10-CM | POA: Diagnosis not present

## 2018-06-26 MED ORDER — IOPAMIDOL (ISOVUE-370) INJECTION 76%
75.0000 mL | Freq: Once | INTRAVENOUS | Status: AC | PRN
  Administered 2018-06-26: 75 mL via INTRAVENOUS

## 2018-07-10 DIAGNOSIS — K5080 Crohn's disease of both small and large intestine without complications: Secondary | ICD-10-CM | POA: Diagnosis not present

## 2018-08-11 ENCOUNTER — Other Ambulatory Visit: Payer: Self-pay | Admitting: Cardiovascular Disease

## 2018-08-12 ENCOUNTER — Encounter: Payer: Self-pay | Admitting: Cardiovascular Disease

## 2018-08-12 ENCOUNTER — Ambulatory Visit: Payer: BLUE CROSS/BLUE SHIELD | Admitting: Cardiovascular Disease

## 2018-08-12 VITALS — BP 128/90 | HR 80 | Ht 68.0 in | Wt 176.4 lb

## 2018-08-12 DIAGNOSIS — I773 Arterial fibromuscular dysplasia: Secondary | ICD-10-CM

## 2018-08-12 DIAGNOSIS — I10 Essential (primary) hypertension: Secondary | ICD-10-CM

## 2018-08-12 DIAGNOSIS — I6523 Occlusion and stenosis of bilateral carotid arteries: Secondary | ICD-10-CM | POA: Diagnosis not present

## 2018-08-12 NOTE — Progress Notes (Signed)
Cardiology Office Note   Date:  08/12/2018   ID:  Melody Gardner, DOB Nov 18, 1965, MRN [id number]  PCP:  Carollee Herter, Alferd Apa, DO  Cardiologist:   Skeet Latch, MD   No chief complaint on file.    History of Present Illness: Melody Gardner is a 52 y.o. 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  Melody Gardner was admitted 12/18/15 with chest pain.  Cardiac enzymes were elevated and she had mild lateral ST elevation.  In the cath she was found to have a spontaneous dissection with 100% occlusion of D1 that was medically managed. Her echo showed LVEF 60-65% with grade 1 diastolic dysfunction.   Melody Gardner reports having her renal artery stented when she was around 36.   Given this history, there was concern for fibromuscular dysplasia.  She was referred for carotid ultrasound that revealed bilateral intimal thickening with 1-39% stenosis bilaterally but no plaque.   [name] underwent CT angiography of her whole body 03/2016.  She was found to have bilateral cervical ICA fibromuscular dysplasia.  Carotid ultrasound revealed 1-39% stenosis bilaterally.  She was evaluated by Dr. Scot Dock 07/04/16 and it was felt that no intervention was necessary. There was no FMD of the renal arteries.  She was also noted to have a 12 mm soft tissue nodule in the right neck suspicious for an ectopic parathyroid adenoma and dental caries.    At her last appointment HCTZ was added.  She followed up with the pharmacist and her blood pressure was still above goal so amlodipine was increased.  However she then developed lower extremity edema.  At the last appointment amlodipine was reduced.  Plans were made for her to increase HCTZ, but it seems as though she is continued on her losartan 100/HCTZ.  Over 71s.  She has not yet taken her blood pressure medication today.  More stressed  and tired third shift.  This makes it more difficult for her to exercise.  She had a repeat CT of the head and neck 06/2018 that was unchanged from 2017.   Past Medical History:  Diagnosis Date  . Acute MI, lateral wall, initial episode of care ([place]) 12/18/2015  . Anemia 2013  . Crohn's disease ([place])   . Depression    pt states she is depressed but not medically diagnosed  . Hypertension 2012  . Hypertensive heart disease 12/19/2015    Past Surgical History:  Procedure Laterality Date  . CARDIAC CATHETERIZATION N/A 12/18/2015   Procedure: Left Heart Cath and Coronary Angiography;  Surgeon: Sherren Mocha, MD;  Location: [facility];  Service: Cardiovascular;  Laterality: N/A;  . ENDOMETRIAL ABLATION  2013  . LAPAROSCOPY FOR ECTOPIC PREGNANCY    . RENAL ARTERY STENT    . TUBAL LIGATION  1989     Current Outpatient Medications  Medication Sig Dispense Refill  . amLODipine (NORVASC) 5 MG tablet Take 1 tablet (5 mg total) by mouth daily. 90 tablet 1  . aspirin EC 81 MG tablet Take 1 tablet (81 mg total) by mouth daily. 30 tablet 12  . azaTHIOprine (IMURAN) 50 MG tablet Take 50 mg by mouth 3 (three) times daily.    . balsalazide (COLAZAL) 750 MG capsule Take 2,250 mg by mouth 3 (three) times daily.    . fluticasone (FLONASE) 50 MCG/ACT nasal spray SHAKE LIQUID AND USE 2 SPRAYS IN EACH NOSTRIL  DAILY 16 g 0  . losartan-hydrochlorothiazide (HYZAAR) 100-12.5 MG tablet TAKE 1 TABLET BY MOUTH DAILY 30 tablet 5  . metoprolol tartrate (LOPRESSOR) 25 MG tablet TAKE 1 TABLET BY MOUTH TWICE DAILY 60 tablet 6  . Multiple Vitamins-Minerals (WOMENS ONE DAILY PO) Take 1 tablet by mouth daily.    . nitroGLYCERIN (NITROSTAT) 0.4 MG SL tablet Place 1 tablet (0.4 mg total) under the tongue every 5 (five) minutes as needed for chest pain. 25 tablet 1   No current facility-administered medications for this visit.     Allergies:   Folic acid; Sulfa antibiotics; and Sulfasalazine    Social History:   The patient  reports that she has never smoked. She has never used smokeless tobacco. She reports that she does not drink alcohol or use drugs.   Family History:  The patient's family history includes Cancer in her brother; Diabetes in her brother and father; Hyperlipidemia in her father; Hypertension in her mother; Neuropathy in her brother and father; Stroke in her father.    ROS:  Please see the history of present illness.   Otherwise, review of systems are positive for none.   All other systems are reviewed and negative.    PHYSICAL EXAM: VS:  BP 128/90   Pulse 80   Ht 5' 8"  (1.727 m)   Wt 176 lb 6.4 oz (80 kg)   BMI 26.82 kg/m  , BMI Body mass index is 26.82 kg/m. GENERAL:  Well appearing HEENT: Pupils equal round and reactive, fundi not visualized, oral mucosa unremarkable NECK:  No jugular venous distention, waveform within normal limits, carotid upstroke brisk and symmetric, R carotid  bruits, no thyromegaly LYMPHATICS:  No cervical adenopathy LUNGS:  Clear to auscultation bilaterally HEART:  RRR.  PMI not displaced or sustained,S1 and S2 within normal limits, no S3, no S4, no clicks, no rubs, no murmurs ABD:  Flat, positive bowel sounds normal in frequency in pitch, no bruits, no rebound, no guarding, no midline pulsatile mass, no hepatomegaly, no splenomegaly EXT:  2 plus pulses throughout, no edema, no cyanosis no clubbing SKIN:  No rashes no nodules NEURO:  Cranial nerves II through XII grossly intact, motor grossly intact throughout PSYCH:  Cognitively intact, oriented to person place and time   EKG:  EKG is not ordered today. 04/26/16: Sinus rhythm rate 79 bpm.  LVH 02/06/18: Sinus rhythm.  Rate 76 bpm.    LHC 12/18/15: 100% D1 due to spontaneous dissection.  No other coronary artery disease.  Echo 12/19/15: Study Conclusions  - Left ventricle: The cavity size was normal. Wall thickness was  normal. Systolic function was normal. The estimated ejection  fraction  was in the range of 60% to 65%. Mildly abnormal GLPSS at  -17%, more regional inferior strain. Wall motion was normal;  there were no regional wall motion abnormalities. Doppler  parameters are consistent with abnormal left ventricular  relaxation (grade 1 diastolic dysfunction). The E/e&' ratio is  between 8-15, suggesting indeterminate LV filling pressure. - Left atrium: The atrium was normal in size. - Tricuspid valve: There was no significant regurgitation. - Inferior vena cava: The vessel was normal in size. The  respirophasic diameter changes were in the normal range (= 50%),  consistent with normal central venous pressure.  Impressions:  - LVEF 60-65%, normal wall thickness, normal wall motion, mildly  abnormal GLPSS at -17% (inferior), Grade 1 DD with indeterminate  LV filling pressure, normal chamber sizes, normal IVC.  Carotid Doppler 01/12/16: Intimal thickening  bilaterally.  1-39% ICA stenosis bilaterally.    Recent Labs: 06/17/2018: ALT 12; BUN 14; Creatinine, Ser 0.76; Hemoglobin 12.8; Platelets 358.0; Potassium 3.6; Sodium 138; TSH 1.91    Lipid Panel    Component Value Date/Time   CHOL 147 06/17/2018 1015   TRIG 78.0 06/17/2018 1015   HDL 45.40 06/17/2018 1015   CHOLHDL 3 06/17/2018 1015   VLDL 15.6 06/17/2018 1015   LDLCALC 86 06/17/2018 1015      Wt Readings from Last 3 Encounters:  08/12/18 176 lb 6.4 oz (80 kg)  06/17/18 172 lb 3.2 oz (78.1 kg)  06/12/18 172 lb 6.4 oz (78.2 kg)      ASSESSMENT AND PLAN:  # Spontaneous coronary artery dissection:  # Fibromuscular dysplasia: # ICA stenosis: Ms. Schrodt had SCAD of D1 and has bilateral ICA stenosis.  CT-A stable 06/2018. Lipids are well-controlled.    # Hypertension:  Blood pressure is a little above goal but she hasn't taken her medication yet.  Continue amlodipine, losartan, HCTZ and metoprolol.  She had LE edema on higher doses of amlodipine.  It has been well-controlled at  home.   Current medicines are reviewed at length with the patient today.  The patient does not have concerns regarding medicines.  The following changes have been made:  none  Labs/ tests ordered today include:   No orders of the defined types were placed in this encounter.    Disposition:   FU with Milanie Rosenfield C. Oval Linsey, MD, [facility] in 6 months.     Signed, Braulio Kiedrowski C. Oval Linsey, MD, [facility]  08/12/2018 1:18 PM    [facility]

## 2018-08-12 NOTE — Patient Instructions (Signed)
Medication Instructions:  Your physician recommends that you continue on your current medications as directed. Please refer to the Current Medication list given to you today.  If you need a refill on your cardiac medications before your next appointment, please call your pharmacy.   Lab work: none  Testing/Procedures: none  Follow-Up: At [facility], you and your health needs are our priority.  As part of our continuing mission to provide you with exceptional heart care, we have created designated Provider Care Teams.  These Care Teams include your primary Cardiologist (physician) and Advanced Practice Providers (APPs -  Physician Assistants and Nurse Practitioners) who all work together to provide you with the care you need, when you need it. You will need a follow up appointment in 6 months.  Please call our office 2 months in advance to schedule this appointment.  You may see Skeet Latch, MD or one of the following Advanced Practice Providers on your designated Care Team:   Kerin Ransom, PA-C Roby Lofts, [place] . Sande Rives, PA-C

## 2018-09-08 ENCOUNTER — Other Ambulatory Visit: Payer: Self-pay | Admitting: Family Medicine

## 2018-09-08 DIAGNOSIS — J0100 Acute maxillary sinusitis, unspecified: Secondary | ICD-10-CM

## 2018-11-05 ENCOUNTER — Other Ambulatory Visit: Payer: Self-pay | Admitting: Cardiovascular Disease

## 2018-12-13 ENCOUNTER — Other Ambulatory Visit: Payer: Self-pay

## 2018-12-13 ENCOUNTER — Emergency Department (HOSPITAL_BASED_OUTPATIENT_CLINIC_OR_DEPARTMENT_OTHER): Payer: BLUE CROSS/BLUE SHIELD

## 2018-12-13 ENCOUNTER — Encounter (HOSPITAL_BASED_OUTPATIENT_CLINIC_OR_DEPARTMENT_OTHER): Payer: Self-pay | Admitting: Adult Health

## 2018-12-13 ENCOUNTER — Emergency Department (HOSPITAL_BASED_OUTPATIENT_CLINIC_OR_DEPARTMENT_OTHER)
Admission: EM | Admit: 2018-12-13 | Discharge: 2018-12-13 | Disposition: A | Discharge status: Home / Self Care | Payer: BLUE CROSS/BLUE SHIELD | Attending: Emergency Medicine | Admitting: Emergency Medicine

## 2018-12-13 DIAGNOSIS — I10 Essential (primary) hypertension: Secondary | ICD-10-CM | POA: Diagnosis not present

## 2018-12-13 DIAGNOSIS — J069 Acute upper respiratory infection, unspecified: Secondary | ICD-10-CM

## 2018-12-13 DIAGNOSIS — Z79899 Other long term (current) drug therapy: Secondary | ICD-10-CM | POA: Insufficient documentation

## 2018-12-13 DIAGNOSIS — B9789 Other viral agents as the cause of diseases classified elsewhere: Secondary | ICD-10-CM

## 2018-12-13 DIAGNOSIS — J0100 Acute maxillary sinusitis, unspecified: Secondary | ICD-10-CM

## 2018-12-13 DIAGNOSIS — Z7982 Long term (current) use of aspirin: Secondary | ICD-10-CM | POA: Diagnosis not present

## 2018-12-13 DIAGNOSIS — R05 Cough: Secondary | ICD-10-CM | POA: Diagnosis present

## 2018-12-13 LAB — GROUP A STREP BY PCR: Group A Strep by PCR: NOT DETECTED

## 2018-12-13 MED ORDER — SALINE SPRAY 0.65 % NA SOLN: 1.0000 | NASAL | 0 refills | Status: DC | PRN

## 2018-12-13 MED ORDER — FLUTICASONE PROPIONATE 50 MCG/ACT NA SUSP: 1.0000 | Freq: Every day | NASAL | 0 refills | Status: DC

## 2018-12-13 MED ORDER — BENZONATATE 100 MG PO CAPS: 100.0000 mg | ORAL_CAPSULE | Freq: Three times a day (TID) | ORAL | 0 refills | Status: DC

## 2018-12-13 NOTE — ED Triage Notes (Signed)
PResents with flu like symptoms, denies fever, endorses sore throat and cough. 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 Denies bodyaches, endorses runny nose.

## 2018-12-13 NOTE — ED Provider Notes (Signed)
[facility] EMERGENCY DEPARTMENT Provider Note   CSN: [id number] Arrival date & time: 12/13/18  1059    History   Chief Complaint Chief Complaint  Patient presents with  . Sore Throat    HPI Melody Gardner is a 53 y.o. female with history of hypertension, MI, Crohn's disease who presents with a 3-day history of cough, nasal congestion, sore throat.  Patient reports chest pain with coughing.  Her cough is productive with mucus.  She denies any ear pain.  She has had sore throat that is worsened with coughing and swallowing.  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  Her work sent her home yesterday because she was exhibiting flulike symptoms.  Patient denies any fever or chills.  Patient has not been taking any medication at home for her symptoms.  She denies any abdominal pain, vomiting, diarrhea.  Patient reports she almost vomited yesterday after coughing a lot.     HPI  Past Medical History:  Diagnosis Date  . Acute MI, lateral wall, initial episode of care ([place]) 12/18/2015  . Anemia 2013  . Crohn's disease ([place])   . Depression    pt states she is depressed but not medically diagnosed  . Hypertension 2012  . Hypertensive heart disease 12/19/2015    Patient Active Problem List   Diagnosis Date Noted  . Crohn's disease with complication ([place]) 19/41/7408  . Neck nodule 05/17/2016  . Hypertensive heart disease 12/19/2015  . 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 Acute MI, lateral wall, initial episode of care ([place]) 12/18/2015  . HTN (hypertension) 01/07/2015  . S/P hydrothermal endometrial ablation on 03/31/2012 03/31/2012  . Endometrial polyp 01/31/2012  . Fibroids 01/30/2012  . Abnormal uterine bleeding 01/30/2012    Past Surgical History:  Procedure Laterality Date  . CARDIAC CATHETERIZATION N/A 12/18/2015   Procedure: Left Heart Cath and Coronary Angiography;  Surgeon: Sherren Mocha, MD;  Location: [facility]  [facility];  Service: Cardiovascular;  Laterality: N/A;  . ENDOMETRIAL ABLATION  2013  . LAPAROSCOPY FOR ECTOPIC PREGNANCY    . RENAL ARTERY STENT    . TUBAL LIGATION  1989     OB History    Gravida  3   Para  2   Term  2   Preterm      AB  1   Living  2     SAB      TAB      Ectopic  1   Multiple      Live Births           Obstetric Comments  01/20/86 pregnancy was twin pregnancy, one fetus did not develop.         Home Medications    Prior to Admission medications   Medication Sig Start Date End Date Taking? Authorizing Provider  amLODipine (NORVASC) 5 MG tablet Take 1 tablet (5 mg total) by mouth daily. 06/12/18 09/10/18  Skeet Latch, MD  aspirin EC 81 MG tablet Take 1 tablet (81 mg total) by mouth daily. 12/22/15   Arbutus Leas, NP  azaTHIOprine (IMURAN) 50 MG tablet Take 50 mg by mouth 3 (three) times daily.    [provider]  balsalazide (COLAZAL) 750 MG capsule Take 2,250 mg by mouth 3 (three) times daily.    [provider]  benzonatate (TESSALON) 100 MG capsule Take 1 capsule (100 mg total) by mouth every 8 (eight) hours. 12/13/18   Frederica Kuster,  PA-C  fluticasone (FLONASE) 50 MCG/ACT nasal spray Place 1 spray into both nostrils daily. 12/13/18   Makelle Marrone, Bea Graff, PA-C  losartan-hydrochlorothiazide (HYZAAR) 100-12.5 MG tablet TAKE 1 TABLET BY MOUTH DAILY 08/11/18   Skeet Latch, MD  metoprolol tartrate (LOPRESSOR) 25 MG tablet TAKE 1 TABLET BY MOUTH TWICE DAILY 11/05/18   Skeet Latch, MD  Multiple Vitamins-Minerals (WOMENS ONE DAILY PO) Take 1 tablet by mouth daily.    [provider]  nitroGLYCERIN (NITROSTAT) 0.4 MG SL tablet Place 1 tablet (0.4 mg total) under the tongue every 5 (five) minutes as needed for chest pain. 12/22/15   Arbutus Leas, NP  sodium chloride (OCEAN) 0.65 % SOLN nasal spray Place 1 spray into both nostrils as needed for congestion. 12/13/18   Frederica Kuster, PA-C    Family History Family  History  Problem Relation Age of Onset  . Hypertension Mother   . Cancer Brother   . Diabetes Brother   . Diabetes Father   . Stroke Father   . Neuropathy Father   . Hyperlipidemia Father   . Neuropathy Brother   . Thyroid disease Neg Hx     Social History Social History   Tobacco Use  . Smoking status: Never Smoker  . Smokeless tobacco: Never Used  Substance Use Topics  . Alcohol use: No  . Drug use: No     Allergies   Folic acid; Sulfa antibiotics; and Sulfasalazine   Review of Systems Review of Systems  Constitutional: Negative for chills and fever.  HENT: Positive for sore throat. Negative for ear pain and facial swelling.   Respiratory: Positive for cough. Negative for shortness of breath.   Cardiovascular: Positive for chest pain (with coughing).  Gastrointestinal: Negative for abdominal pain, nausea and vomiting.  Genitourinary: Negative for dysuria.  Musculoskeletal: Negative for back pain.  Skin: Negative for rash and wound.  Neurological: Negative for headaches.  Psychiatric/Behavioral: The patient is not nervous/anxious.      Physical Exam Updated Vital Signs BP (!) 147/101 (BP Location: Left Arm)   Pulse 87   Temp 98.4 F (36.9 C) (Oral)   Resp 20   Ht 5' 7"  (1.702 m)   Wt 79.4 kg   LMP 12/13/2018   SpO2 100%   BMI 27.41 kg/m   Physical Exam Vitals signs and nursing note reviewed.  Constitutional:      General: She is not in acute distress.    Appearance: She is well-developed. She is not diaphoretic.  HENT:     Head: Normocephalic and atraumatic.     Right Ear: Tympanic membrane normal.     Left Ear: Tympanic membrane normal.     Mouth/Throat:     Pharynx: No oropharyngeal exudate.     Tonsils: Swelling: 3+ on the right. 3+ on the left.  Eyes:     General: No scleral icterus.       Right eye: No discharge.        Left eye: No discharge.     Conjunctiva/sclera: Conjunctivae normal.     Pupils: Pupils are equal, round, and reactive  to light.  Neck:     Musculoskeletal: Normal range of motion and neck supple.     Thyroid: No thyromegaly.  Cardiovascular:     Rate and Rhythm: Normal rate and regular rhythm.     Heart sounds: Normal heart sounds. No murmur. No friction rub. No gallop.   Pulmonary:     Effort: Pulmonary effort is normal. No respiratory  distress.     Breath sounds: Normal breath sounds. No stridor. No wheezing or rales.  Abdominal:     General: Bowel sounds are normal. There is no distension.     Palpations: Abdomen is soft.     Tenderness: There is no abdominal tenderness. There is no guarding or rebound.  Lymphadenopathy:     Cervical: No cervical adenopathy.  Skin:    General: Skin is warm and dry.     Coloration: Skin is not pale.     Findings: No rash.  Neurological:     Mental Status: She is alert.     Coordination: Coordination normal.      ED Treatments / Results  Labs (all labs ordered are listed, but only abnormal results are displayed) Labs Reviewed  GROUP A STREP BY PCR    EKG None  Radiology Dg Chest 2 View  Result Date: 12/13/2018 CLINICAL DATA:  Cough and congestion for 2 days. EXAM: CHEST - 2 VIEW COMPARISON:  10/14/2017 FINDINGS: The cardiomediastinal silhouette is unremarkable. There is no evidence of focal airspace disease, pulmonary edema, suspicious pulmonary nodule/mass, pleural effusion, or pneumothorax. No acute bony abnormalities are identified. IMPRESSION: No active cardiopulmonary disease. Electronically Signed   By: Margarette Canada M.D.   On: 12/13/2018 12:25    Procedures Procedures (including critical care time)  Medications Ordered in ED Medications - No data to display   Initial Impression / Assessment and Plan / ED Course  I have reviewed the triage vital signs and the nursing notes.  Pertinent labs & imaging results that were available during my care of the patient were reviewed by me and considered in my medical decision making (see chart for  details).        Pt symptoms consistent with URI. Strep PCR negative. CXR negative for acute infiltrate. Pt will be discharged with symptomatic treatment, including Flonase, Tessalon, nasal saline.  Discussed return precautions.  Patient understands and agrees with plan.  Pt is hemodynamically stable & in NAD prior to discharge.   Final Clinical Impressions(s) / ED Diagnoses   Final diagnoses:  Viral URI with cough    ED Discharge Orders         Ordered    benzonatate (TESSALON) 100 MG capsule  Every 8 hours     12/13/18 1251    fluticasone (FLONASE) 50 MCG/ACT nasal spray  Daily     12/13/18 1251    sodium chloride (OCEAN) 0.65 % SOLN nasal spray  As needed     12/13/18 [place], Dominik Yordy M, PA-C 12/13/18 1906    Lajean Saver, MD 12/14/18 [phone]

## 2018-12-13 NOTE — Discharge Instructions (Addendum)
Take Tessalon every 8 hours as needed for cough.  Take Flonase once daily.  Use nasal saline throughout the day, as needed for nasal congestion.  Make sure to get plenty of rest and drink plenty of fluids.  Please follow-up with your doctor if your symptoms are not improving over the next week.  Please return the emergency department if you develop new or worsening symptoms including shortness of breath, persistent fever 100.4, or any other new or concerning symptoms.

## 2018-12-13 NOTE — ED Notes (Signed)
Pt verbalized understanding of dc instructions.

## 2018-12-15 ENCOUNTER — Telehealth: Payer: Self-pay | Admitting: *Deleted

## 2018-12-15 NOTE — Telephone Encounter (Signed)
Patient called the after hours lin on 12/13/18.  "Caller states she has had a sore throat, runny nose, sweating, and spitting up mucus, coughing, no fever.  This started yesterday."  Patient advised to go to ED.  She did go.

## 2019-01-02 ENCOUNTER — Ambulatory Visit (INDEPENDENT_AMBULATORY_CARE_PROVIDER_SITE_OTHER): Payer: BLUE CROSS/BLUE SHIELD | Admitting: Family Medicine

## 2019-01-02 ENCOUNTER — Other Ambulatory Visit: Payer: Self-pay

## 2019-01-02 ENCOUNTER — Encounter: Payer: Self-pay | Admitting: Family Medicine

## 2019-01-02 DIAGNOSIS — M722 Plantar fascial fibromatosis: Secondary | ICD-10-CM

## 2019-01-02 DIAGNOSIS — L84 Corns and callosities: Secondary | ICD-10-CM | POA: Diagnosis not present

## 2019-01-02 HISTORY — DX: Plantar fascial fibromatosis: M72.2

## 2019-01-02 NOTE — Progress Notes (Signed)
Virtual Visit via Video Note  I connected with Melody Gardner on 01/02/19 at  3:30 PM EDT by a video enabled telemedicine application and verified that I am speaking with the correct person using two identifiers.   I discussed the limitations of evaluation and management by telemedicine and the availability of in person appointments. The patient expressed understanding and agreed to proceed.  History of Present Illness: Pt is home --c/o  Bottom L foot pain , sometime R foot.  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   Observations/Objective: Afebrile Pt is in NAD   Assessment and Plan: 1. Plantar fasciitis Stretches Ice Tylenol Pt will try to get a better pair of shoes -- at [employer]  - Ambulatory referral to Podiatry  2. Pre-ulcerative corn or callous Refer to podiatry - Ambulatory referral to Podiatry   Follow Up Instructions:    I discussed the assessment and treatment plan with the patient. The patient was provided an opportunity to ask questions and all were answered. The patient agreed with the plan and demonstrated an understanding of the instructions.   The patient was advised to call back or seek an in-person evaluation if the symptoms worsen or if the condition fails to improve as anticipated.  I provided 25 minutes of non-face-to-face time during this encounter.   Ann Held, DO

## 2019-01-30 ENCOUNTER — Encounter: Payer: Self-pay | Admitting: Cardiology

## 2019-01-30 ENCOUNTER — Telehealth (INDEPENDENT_AMBULATORY_CARE_PROVIDER_SITE_OTHER): Payer: BLUE CROSS/BLUE SHIELD | Admitting: Cardiology

## 2019-01-30 ENCOUNTER — Telehealth: Payer: Self-pay

## 2019-01-30 ENCOUNTER — Telehealth: Payer: Self-pay | Admitting: Cardiology

## 2019-01-30 VITALS — BP 133/84 | HR 76 | Ht 67.0 in | Wt 172.0 lb

## 2019-01-30 DIAGNOSIS — I701 Atherosclerosis of renal artery: Secondary | ICD-10-CM | POA: Diagnosis not present

## 2019-01-30 DIAGNOSIS — I779 Disorder of arteries and arterioles, unspecified: Secondary | ICD-10-CM | POA: Insufficient documentation

## 2019-01-30 DIAGNOSIS — I10 Essential (primary) hypertension: Secondary | ICD-10-CM

## 2019-01-30 DIAGNOSIS — I2542 Coronary artery dissection: Secondary | ICD-10-CM | POA: Diagnosis not present

## 2019-01-30 DIAGNOSIS — I6523 Occlusion and stenosis of bilateral carotid arteries: Secondary | ICD-10-CM | POA: Diagnosis not present

## 2019-01-30 DIAGNOSIS — I739 Peripheral vascular disease, unspecified: Secondary | ICD-10-CM

## 2019-01-30 HISTORY — DX: Disorder of arteries and arterioles, unspecified: I77.9

## 2019-01-30 NOTE — Telephone Encounter (Signed)
Contacted patient to discuss AVS instructions. Patient notified and voiced understanding.

## 2019-01-30 NOTE — Patient Instructions (Addendum)
Medication Instructions:  Your physician recommends that you continue on your current medications as directed. Please refer to the Current Medication list given to you today. If you need a refill on your cardiac medications before your next appointment, please call your pharmacy.   Lab work: None  If you have labs (blood work) drawn today and your tests are completely normal, you will receive your results only by: Marland Kitchen MyChart Message (if you have MyChart) OR . A paper copy in the mail If you have any lab test that is abnormal or we need to change your treatment, we will call you to review the results.  Testing/Procedures: None   Follow-Up: At [facility], you and your health needs are our priority.  As part of our continuing mission to provide you with exceptional heart care, we have created designated Provider Care Teams.  These Care Teams include your primary Cardiologist (physician) and Advanced Practice Providers (APPs -  Physician Assistants and Nurse Practitioners) who all work together to provide you with the care you need, when you need it. You will need a follow up appointment in 6 months.  Please call our office 2 months in advance to schedule this appointment.  You may see Skeet Latch, MD or one of the following Advanced Practice Providers on your designated Care Team:   Kerin Ransom, PA-C Roby Lofts, [place] . Sande Rives, PA-C  Any Other Special Instructions Will Be Listed Below (If Applicable).

## 2019-01-30 NOTE — Telephone Encounter (Signed)
Follow Up: ° ° ° °Returning your call from yesterday. °

## 2019-01-30 NOTE — Progress Notes (Signed)
Virtual Visit via Telephone Note   This visit type was conducted due to national recommendations for restrictions regarding the COVID-19 Pandemic (e.g. social distancing) in an effort to limit this patient's exposure and mitigate transmission in our community.  Due to her co-morbid illnesses, this patient is at least at moderate risk for complications without adequate follow up.  This format is felt to be most appropriate for this patient at this time.  The patient did not have access to video technology/had technical difficulties with video requiring transitioning to audio format only (telephone).  All issues noted in this document were discussed and addressed.  No physical exam could be performed with this format.  Please refer to the patient's chart for her  consent to telehealth for [facility].   Date:  01/30/2019   ID:  Melody Gardner, DOB 03/26/1966, MRN [id number]  Patient Location: Home Provider Location: Home  PCP:  Ann Held, DO  Cardiologist:  Skeet Latch, MD  Electrophysiologist:  None   Evaluation Performed:  Follow-Up Visit  Chief Complaint:  6 month f/u  History of Present Illness:    Melody Gardner is a 53 y.o. 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  She had no other significant coronary disease at catheterization.  Her LV function was preserved with an ejection fraction of 60 to 65%.  She also has a history of treated hypertension, prior renal artery stenting 15 years ago, mild carotid disease by CTA September 2019, and Crohn's colitis.  She last saw Dr. Oval Linsey in November 2019.  She is done well from a cardiac standpoint.  She denies any chest pain or any problems with her medications.  Her blood pressures been controlled.  The patient does not have symptoms concerning for COVID-19 infection (fever, chills, cough, or new shortness of breath).    Past Medical History:  Diagnosis Date  . Acute MI, lateral wall, initial episode of care ([place])  12/18/2015  . Anemia 2013  . Crohn's disease ([place])   . Depression    pt states she is depressed but not medically diagnosed  . Hypertension 2012  . Hypertensive heart disease 12/19/2015   Past Surgical History:  Procedure Laterality Date  . CARDIAC CATHETERIZATION N/A 12/18/2015   Procedure: Left Heart Cath and Coronary Angiography;  Surgeon: Sherren Mocha, MD;  Location: [facility];  Service: Cardiovascular;  Laterality: N/A;  . ENDOMETRIAL ABLATION  2013  . LAPAROSCOPY FOR ECTOPIC PREGNANCY    . RENAL ARTERY STENT    . TUBAL LIGATION  1989     Current Meds  Medication Sig  . amLODipine (NORVASC) 5 MG tablet Take 1 tablet (5 mg total) by mouth daily.  Marland Kitchen aspirin EC 81 MG tablet Take 1 tablet (81 mg total) by mouth daily.  Marland Kitchen azaTHIOprine (IMURAN) 50 MG tablet Take 50 mg by mouth 3 (three) times daily.  . balsalazide (COLAZAL) 750 MG capsule Take 2,250 mg by mouth 3 (three) times daily.  . fluticasone (FLONASE) 50 MCG/ACT nasal spray Place 1 spray into both nostrils daily.  Marland Kitchen losartan-hydrochlorothiazide (HYZAAR) 100-12.5 MG tablet TAKE 1 TABLET BY MOUTH DAILY  . metoprolol tartrate (LOPRESSOR) 25 MG tablet TAKE 1 TABLET BY MOUTH TWICE DAILY  . Multiple Vitamins-Minerals (WOMENS ONE DAILY PO) Take 1 tablet by mouth daily.  . nitroGLYCERIN (NITROSTAT) 0.4 MG SL tablet Place 1 tablet (0.4 mg total) under the tongue every 5 (five) minutes as needed for chest pain.  . sodium chloride (  OCEAN) 0.65 % SOLN nasal spray Place 1 spray into both nostrils as needed for congestion.     Allergies:   Folic acid; Sulfa antibiotics; and Sulfasalazine   Social History   Tobacco Use  . Smoking status: Never Smoker  . Smokeless tobacco: Never Used  Substance Use Topics  . Alcohol use: No  . Drug use: No     Family Hx: The patient's family history includes Cancer in her brother; Diabetes in her brother and father; Hyperlipidemia in her father; Hypertension in her mother; Neuropathy in her  brother and father; Stroke in her father. There is no history of Thyroid disease.  ROS:   Please see the history of present illness.    All other systems reviewed and are negative.   Prior CV studies:   The following studies were reviewed today:   Labs/Other Tests and Data Reviewed:    EKG:  No ECG reviewed.  Recent Labs: 06/17/2018: ALT 12; BUN 14; Creatinine, Ser 0.76; Hemoglobin 12.8; Platelets 358.0; Potassium 3.6; Sodium 138; TSH 1.91   Recent Lipid Panel Lab Results  Component Value Date/Time   CHOL 147 06/17/2018 10:15 AM   TRIG 78.0 06/17/2018 10:15 AM   HDL 45.40 06/17/2018 10:15 AM   CHOLHDL 3 06/17/2018 10:15 AM   LDLCALC 86 06/17/2018 10:15 AM    Wt Readings from Last 3 Encounters:  01/30/19 172 lb (78 kg)  12/13/18 175 lb (79.4 kg)  08/12/18 176 lb 6.4 oz (80 kg)     Objective:    Vital Signs:  BP 133/84   Pulse 76   Ht 5' 7"  (1.702 m)   Wt 172 lb (78 kg)   BMI 26.94 kg/m    VITAL SIGNS:  reviewed  ASSESSMENT & PLAN:    SCAD- March 2017- no other CAD at cath and preserved LVF  HTN- Controlled  H/O RAS- S/p remote RA stenting which reportedly was patent on f/u CTA  Carotid artery disease- Mild by CTA Sept 2019  Crohn's colitis Per PCP- on Imuran  COVID-19 Education: The signs and symptoms of COVID-19 were discussed with the patient and how to seek care for testing (follow up with PCP or arrange E-visit).  The importance of social distancing was discussed today.  Time:   Today, I have spent 10 minutes with the patient with telehealth technology discussing the above problems.     Medication Adjustments/Labs and Tests Ordered: Current medicines are reviewed at length with the patient today.  Concerns regarding medicines are outlined above.   Tests Ordered: No orders of the defined types were placed in this encounter.   Medication Changes: No orders of the defined types were placed in this encounter.   Disposition:  Follow up Dr  Oval Linsey 6 months  Signed, Melody Ransom, PA-C  01/30/2019 3:06 PM    [place]

## 2019-01-30 NOTE — Telephone Encounter (Signed)
Virtual Visit Pre-Appointment Phone Call  "Melody Gardner, I am calling you today to discuss your upcoming appointment. We are currently trying to limit exposure to the virus that causes COVID-19 by seeing patients at home rather than in the office."  1. "What is the BEST phone number to call the day of the visit?" - include this in appointment notes  2. "Do you have or have access to (through a family member/friend) a smartphone with video capability that we can use for your visit?" a. If yes - list this number in appt notes as "cell" (if different from BEST phone #) and list the appointment type as a VIDEO visit in appointment notes b. If no - list the appointment type as a PHONE visit in appointment notes  3. Confirm consent - "In the setting of the current Covid19 crisis, you are scheduled for a VIDEO visit with your provider on 01/30/2019 at [facility].  Just as we do with many in-office visits, in order for you to participate in this visit, we must obtain consent.  If you'd like, I can send this to your mychart (if signed up) or email for you to review.  Otherwise, I can obtain your verbal consent now.  All virtual visits are billed to your insurance company just like a normal visit would be.  By agreeing to a virtual visit, we'd like you to understand that the technology does not allow for your provider to perform an examination, and thus may limit your provider's ability to fully assess your condition. If your provider identifies any concerns that need to be evaluated in person, we will make arrangements to do so.  Finally, though the technology is pretty good, we cannot assure that it will always work on either your or our end, and in the setting of a video visit, we may have to convert it to a phone-only visit.  In either situation, we cannot ensure that we have a secure connection.  Are you willing to proceed?" STAFF: Did the patient verbally acknowledge consent to telehealth visit? Document YES/NO  here: YES  4. Advise patient to be prepared - "Two hours prior to your appointment, go ahead and check your blood pressure, pulse, oxygen saturation, and your weight (if you have the equipment to check those) and write them all down. When your visit starts, your provider will ask you for this information. If you have an Apple Watch or Kardia device, please plan to have heart rate information ready on the day of your appointment. Please have a pen and paper handy nearby the day of the visit as well."  5. Give patient instructions for MyChart download to smartphone OR Doximity/Doxy.me as below if video visit (depending on what platform provider is using)  6. Inform patient they will receive a phone call 15 minutes prior to their appointment time (may be from unknown caller ID) so they should be prepared to answer    TELEPHONE CALL NOTE  Melody Gardner has been deemed a candidate for a follow-up tele-health visit to limit community exposure during the Covid-19 pandemic. I spoke with the patient via phone to ensure availability of phone/video source, confirm preferred email & phone number, and discuss instructions and expectations.  I reminded Melody Gardner to be prepared with any vital sign and/or heart rhythm information that could potentially be obtained via home monitoring, at the time of her visit. I reminded Melody Gardner to expect a phone call prior to her visit.  Melody Gardner,  Lanetta Inch, CMA 01/30/2019 1:36 PM   INSTRUCTIONS FOR DOWNLOADING THE MYCHART APP TO SMARTPHONE  - The patient must first make sure to have activated MyChart and know their login information - If Apple, go to [employer] and type in MyChart in the search bar and download the app. If Android, ask patient to go to [employer] and type in [place] in the search bar and download the app. The app is free but as with any other app downloads, their phone may require them to verify saved payment information or Apple/Android password.  -  The patient will need to then log into the app with their MyChart username and password, and select [facility] as their healthcare provider to link the account. When it is time for your visit, go to the MyChart app, find appointments, and click Begin Video Visit. Be sure to Select Allow for your device to access the Microphone and Camera for your visit. You will then be connected, and your provider will be with you shortly.  **If they have any issues connecting, or need assistance please contact MyChart service desk [phone])**  **If using a computer, in order to ensure the best quality for their visit they will need to use either of the following Internet Browsers: [employer], or Google Chrome**  IF USING DOXIMITY or DOXY.ME - The patient will receive a link just prior to their visit by text.     FULL LENGTH CONSENT FOR TELE-HEALTH VISIT   I hereby voluntarily request, consent and authorize [place] and its employed or contracted physicians, physician assistants, nurse practitioners or other licensed health care professionals (the Practitioner), to provide me with telemedicine health care services (the "Services") as deemed necessary by the treating Practitioner. I acknowledge and consent to receive the Services by the Practitioner via telemedicine. I understand that the telemedicine visit will involve communicating with the Practitioner through live audiovisual communication technology and the disclosure of certain medical information by electronic transmission. I acknowledge that I have been given the opportunity to request an in-person assessment or other available alternative prior to the telemedicine visit and am voluntarily participating in the telemedicine visit.  I understand that I have the right to withhold or withdraw my consent to the use of telemedicine in the course of my care at any time, without affecting my right to future care or treatment, and that the  Practitioner or I may terminate the telemedicine visit at any time. I understand that I have the right to inspect all information obtained and/or recorded in the course of the telemedicine visit and may receive copies of available information for a reasonable fee.  I understand that some of the potential risks of receiving the Services via telemedicine include:  Marland Kitchen Delay or interruption in medical evaluation due to technological equipment failure or disruption; . Information transmitted may not be sufficient (e.g. poor resolution of images) to allow for appropriate medical decision making by the Practitioner; and/or  . In rare instances, security protocols could fail, causing a breach of personal health information.  Furthermore, I acknowledge that it is my responsibility to provide information about my medical history, conditions and care that is complete and accurate to the best of my ability. I acknowledge that Practitioner's advice, recommendations, and/or decision may be based on factors not within their control, such as incomplete or inaccurate data provided by me or distortions of diagnostic images or specimens that may result from electronic transmissions. I understand that the  practice of medicine is not an Chief Strategy Officer and that Practitioner makes no warranties or guarantees regarding treatment outcomes. I acknowledge that I will receive a copy of this consent concurrently upon execution via email to the email address I last provided but may also request a printed copy by calling the office of [facility].    I understand that my insurance will be billed for this visit.   I have read or had this consent read to me. . I understand the contents of this consent, which adequately explains the benefits and risks of the Services being provided via telemedicine.  . I have been provided ample opportunity to ask questions regarding this consent and the Services and have had my questions answered to my  satisfaction. . I give my informed consent for the services to be provided through the use of telemedicine in my medical care  By participating in this telemedicine visit I agree to the above.

## 2019-02-26 ENCOUNTER — Other Ambulatory Visit: Payer: Self-pay | Admitting: Cardiovascular Disease

## 2019-04-17 ENCOUNTER — Encounter: Payer: Self-pay | Admitting: Family Medicine

## 2019-04-17 ENCOUNTER — Ambulatory Visit (INDEPENDENT_AMBULATORY_CARE_PROVIDER_SITE_OTHER): Payer: PRIVATE HEALTH INSURANCE | Admitting: Family Medicine

## 2019-04-17 VITALS — BP 114/84 | HR 75

## 2019-04-17 DIAGNOSIS — N760 Acute vaginitis: Secondary | ICD-10-CM | POA: Diagnosis not present

## 2019-04-17 DIAGNOSIS — N926 Irregular menstruation, unspecified: Secondary | ICD-10-CM | POA: Diagnosis not present

## 2019-04-17 NOTE — Progress Notes (Signed)
Virtual Visit via Video Note  I connected with Melody Gardner on 04/17/19 at  3:30 PM EDT by a video enabled telemedicine application and verified that I am speaking with the correct person using two identifiers.  Location: Patient: in car Provider: office   I discussed the limitations of evaluation and management by telemedicine and the availability of in person appointments. The patient expressed understanding and agreed to proceed.  History of Present Illness: Pt is out shopping.  She c/o cramping and vaginal bleeding inbetween periods x 1 day.  Some vaginal d/c as well Observations/Objective:   Assessment and Plan: 1. Irregular menstrual bleeding Check labs and [place]  If bleeding worsens -- go to ER  - TSH; Future - POCT urine pregnancy; Future - POCT urinalysis dipstick; Future - CBC with Differential/Platelet; Future - Estradiol; Future - LH; Future - US PELVIS (TRANSABDOMINAL ONLY); Future - US Pelvic Complete With Transvaginal; Future  Follow Up Instructions:    I discussed the assessment and treatment plan with the patient. The patient was provided an opportunity to ask questions and all were answered. The patient agreed with the plan and demonstrated an understanding of the instructions.   The patient was advised to call back or seek an in-person evaluation if the symptoms worsen or if the condition fails to improve as anticipated.  I provided 15 minutes of non-face-to-face time during this encounter.   Ann Held, DO

## 2019-04-20 ENCOUNTER — Ambulatory Visit (HOSPITAL_BASED_OUTPATIENT_CLINIC_OR_DEPARTMENT_OTHER)
Admission: RE | Admit: 2019-04-20 | Discharge: 2019-04-20 | Disposition: A | Discharge status: Home / Self Care | Payer: PRIVATE HEALTH INSURANCE | Source: Ambulatory Visit | Attending: Family Medicine | Admitting: Family Medicine

## 2019-04-20 ENCOUNTER — Other Ambulatory Visit: Payer: Self-pay | Admitting: Family Medicine

## 2019-04-20 ENCOUNTER — Other Ambulatory Visit: Payer: Self-pay

## 2019-04-20 DIAGNOSIS — N83209 Unspecified ovarian cyst, unspecified side: Secondary | ICD-10-CM

## 2019-04-20 DIAGNOSIS — N939 Abnormal uterine and vaginal bleeding, unspecified: Secondary | ICD-10-CM

## 2019-04-20 DIAGNOSIS — D259 Leiomyoma of uterus, unspecified: Secondary | ICD-10-CM

## 2019-04-20 DIAGNOSIS — N926 Irregular menstruation, unspecified: Secondary | ICD-10-CM | POA: Insufficient documentation

## 2019-05-11 ENCOUNTER — Other Ambulatory Visit: Payer: Self-pay

## 2019-05-11 ENCOUNTER — Other Ambulatory Visit: Payer: Self-pay | Admitting: Podiatry

## 2019-05-11 ENCOUNTER — Ambulatory Visit (INDEPENDENT_AMBULATORY_CARE_PROVIDER_SITE_OTHER): Payer: PRIVATE HEALTH INSURANCE

## 2019-05-11 ENCOUNTER — Ambulatory Visit: Payer: PRIVATE HEALTH INSURANCE | Admitting: Podiatry

## 2019-05-11 VITALS — Temp 97.2°F

## 2019-05-11 DIAGNOSIS — M21619 Bunion of unspecified foot: Secondary | ICD-10-CM

## 2019-05-11 DIAGNOSIS — M79672 Pain in left foot: Secondary | ICD-10-CM

## 2019-05-11 DIAGNOSIS — Q828 Other specified congenital malformations of skin: Secondary | ICD-10-CM

## 2019-05-11 DIAGNOSIS — M79671 Pain in right foot: Secondary | ICD-10-CM

## 2019-05-11 DIAGNOSIS — M21612 Bunion of left foot: Secondary | ICD-10-CM | POA: Diagnosis not present

## 2019-05-11 NOTE — Patient Instructions (Signed)
Bunion  A bunion is a bump on the base of the big toe that forms when the bones of the big toe joint move out of position. Bunions may be small at first, but they often get larger over time. They can make walking painful. What are the causes? A bunion may be caused by:  Wearing narrow or pointed shoes that force the big toe to press against the other toes.  Abnormal foot development that causes the foot to roll inward (pronate).  Changes in the foot that are caused by certain diseases, such as rheumatoid arthritis or polio.  A foot injury. What increases the risk? The following factors may make you more likely to develop this condition:  Wearing shoes that squeeze the toes together.  Having certain diseases, such as: ? Rheumatoid arthritis. ? Polio. ? Cerebral palsy.  Having family members who have bunions.  Being born with a foot deformity, such as flat feet or low arches.  Doing activities that put a lot of pressure on the feet, such as ballet dancing. What are the signs or symptoms? The main symptom of a bunion is a noticeable bump on the big toe. Other symptoms may include:  Pain.  Swelling around the big toe.  Redness and inflammation.  Thick or hardened skin on the big toe or between the toes.  Stiffness or loss of motion in the big toe.  Trouble with walking. How is this diagnosed? A bunion may be diagnosed based on your symptoms, medical history, and activities. You may have tests, such as:  X-rays. These allow your health care provider to check the position of the bones in your foot and look for damage to your joint. They also help your health care provider determine the severity of your bunion and the best way to treat it.  Joint aspiration. In this test, a sample of fluid is removed from the toe joint. This test may be done if you are in a lot of pain. It helps rule out diseases that cause painful swelling of the joints, such as arthritis. How is this  treated? Treatment depends on the severity of your symptoms. The goal of treatment is to relieve symptoms and prevent the bunion from getting worse. Your health care provider may recommend:  Wearing shoes that have a wide toe box.  Using bunion pads to cushion the affected area.  Taping your toes together to keep them in a normal position.  Placing a device inside your shoe (orthotics) to help reduce pressure on your toe joint.  Taking medicine to ease pain, inflammation, and swelling.  Applying heat or ice to the affected area.  Doing stretching exercises.  Surgery to remove scar tissue and move the toes back into their normal position. This treatment is rare. Follow these instructions at home: Managing pain, stiffness, and swelling   If directed, put ice on the painful area: ? Put ice in a plastic bag. ? Place a towel between your skin and the bag. ? Leave the ice on for 20 minutes, 2-3 times a day. Activity   If directed, apply heat to the affected area before you exercise. Use the heat source that your health care provider recommends, such as a moist heat pack or a heating pad. ? Place a towel between your skin and the heat source. ? Leave the heat on for 20-30 minutes. ? Remove the heat if your skin turns bright red. This is especially important if you are unable to feel pain,   heat, or cold. You may have a greater risk of getting burned.  Do exercises as told by your health care provider. General instructions  Support your toe joint with proper footwear, shoe padding, or taping as told by your health care provider.  Take over-the-counter and prescription medicines only as told by your health care provider.  Keep all follow-up visits as told by your health care provider. This is important. Contact a health care provider if your symptoms:  Get worse.  Do not improve in 2 weeks. Get help right away if you have:  Severe pain and trouble with walking. Summary  A  bunion is a bump on the base of the big toe that forms when the bones of the big toe joint move out of position.  Bunions can make walking painful.  Treatment depends on the severity of your symptoms.  Support your toe joint with proper footwear, shoe padding, or taping as told by your health care provider. This information is not intended to replace advice given to you by your health care provider. Make sure you discuss any questions you have with your health care provider. Document Released: 09/17/2005 Document Revised: 03/24/2018 Document Reviewed: 01/28/2018 Elsevier Patient Education  2020 [employer]  

## 2019-05-12 NOTE — Progress Notes (Signed)
Subjective:   Patient ID: Melody Gardner, female   DOB: 53 y.o.   MRN: [id number]   HPI 53 year old female presents the office today with concerns of pain mostly in the right foot she points to the plantar medial aspect of the foot on submetatarsal 1.  She states that she has had calluses to both of her feet previously but she is not sure what is going on on the right side now.  She said it hurts to put pressure to the area at times and she is tried changing shoes or any significant relief.  No recent injury.  Denies stepping on any foreign objects.   Review of Systems  All other systems reviewed and are negative.  Past Medical History:  Diagnosis Date  . Acute MI, lateral wall, initial episode of care ([place]) 12/18/2015  . Anemia 2013  . Crohn's disease ([place])   . Depression    pt states she is depressed but not medically diagnosed  . Hypertension 2012  . Hypertensive heart disease 12/19/2015    Past Surgical History:  Procedure Laterality Date  . CARDIAC CATHETERIZATION N/A 12/18/2015   Procedure: Left Heart Cath and Coronary Angiography;  Surgeon: Sherren Mocha, MD;  Location: [facility];  Service: Cardiovascular;  Laterality: N/A;  . ENDOMETRIAL ABLATION  2013  . LAPAROSCOPY FOR ECTOPIC PREGNANCY    . RENAL ARTERY STENT    . TUBAL LIGATION  1989     Current Outpatient Medications:  .  amLODipine (NORVASC) 5 MG tablet, Take 1 tablet (5 mg total) by mouth daily., Disp: 90 tablet, Rfl: 1 .  aspirin EC 81 MG tablet, Take 1 tablet (81 mg total) by mouth daily., Disp: 30 tablet, Rfl: 12 .  azaTHIOprine (IMURAN) 50 MG tablet, Take 50 mg by mouth 3 (three) times daily., Disp: , Rfl:  .  balsalazide (COLAZAL) 750 MG capsule, Take 2,250 mg by mouth 3 (three) times daily., Disp: , Rfl:  .  fluticasone (FLONASE) 50 MCG/ACT nasal spray, Place 1 spray into both nostrils daily., Disp: 5 g, Rfl: 0 .  losartan-hydrochlorothiazide (HYZAAR) 100-12.5 MG tablet, TAKE 1 TABLET BY MOUTH DAILY,  Disp: 90 tablet, Rfl: 3 .  metoprolol tartrate (LOPRESSOR) 25 MG tablet, TAKE 1 TABLET BY MOUTH TWICE DAILY, Disp: 60 tablet, Rfl: 6 .  Multiple Vitamins-Minerals (WOMENS ONE DAILY PO), Take 1 tablet by mouth daily., Disp: , Rfl:  .  nitroGLYCERIN (NITROSTAT) 0.4 MG SL tablet, Place 1 tablet (0.4 mg total) under the tongue every 5 (five) minutes as needed for chest pain., Disp: 25 tablet, Rfl: 1 .  sodium chloride (OCEAN) 0.65 % SOLN nasal spray, Place 1 spray into both nostrils as needed for congestion., Disp: 15 mL, Rfl: 0  Allergies  Allergen Reactions  . Folic Acid Rash    Pt declines this allergy  . Sulfa Antibiotics Rash  . Sulfasalazine Itching and Rash        Objective:  Physical Exam  General: AAO x3, NAD  Dermatological: Thick hyperkeratotic tissue is present submetatarsal 1 bilaterally with the right side worse than left.  Upon debridement there is no ongoing ulceration, drainage or any signs of.  No open lesions.  Vascular: Dorsalis Pedis artery and Posterior Tibial artery pedal pulses are 2/4 bilateral with immedate capillary fill time.  There is no pain with calf compression, swelling, warmth, erythema.   Neruologic: Grossly intact via light touch bilateral. Protective threshold with Semmes Wienstein monofilament intact to all pedal sites bilateral.  Musculoskeletal: Significant bunion deformities present.  Tenderness palpation submetatarsal 1 over the area hyperkeratotic lesions but no other areas of discomfort.  Muscular strength 5/5 in all groups tested bilateral.  Gait: Unassisted, Nonantalgic.       Assessment:   Symptomatic hyperkeratotic lesions bilaterally    Plan:  -Treatment options discussed including all alternatives, risks, and complications -Etiology of symptoms were discussed -X-rays were obtained and reviewed with the patient.  Significant bunion deformities present.  No evidence of acute fracture or foreign body. -I debrided the hyperkeratotic  lesions x2 without any complications or bleeding.  Recommend moisturizer daily as well as offloading.  Discussed the etiology of the calluses and likely reoccurrence.  Her knee is not causing pain at this time.  Monitor.  Return if symptoms worsen or fail to improve.

## 2019-05-21 ENCOUNTER — Encounter: Payer: Self-pay | Admitting: Family Medicine

## 2019-05-21 ENCOUNTER — Ambulatory Visit (INDEPENDENT_AMBULATORY_CARE_PROVIDER_SITE_OTHER): Payer: PRIVATE HEALTH INSURANCE | Admitting: Family Medicine

## 2019-05-21 ENCOUNTER — Other Ambulatory Visit: Payer: Self-pay

## 2019-05-21 VITALS — BP 118/70 | HR 68 | Ht 67.0 in | Wt 177.0 lb

## 2019-05-21 DIAGNOSIS — B977 Papillomavirus as the cause of diseases classified elsewhere: Secondary | ICD-10-CM

## 2019-05-21 DIAGNOSIS — Z1151 Encounter for screening for human papillomavirus (HPV): Secondary | ICD-10-CM | POA: Diagnosis not present

## 2019-05-21 DIAGNOSIS — Z124 Encounter for screening for malignant neoplasm of cervix: Secondary | ICD-10-CM | POA: Diagnosis not present

## 2019-05-21 DIAGNOSIS — D259 Leiomyoma of uterus, unspecified: Secondary | ICD-10-CM | POA: Diagnosis not present

## 2019-05-21 DIAGNOSIS — Z113 Encounter for screening for infections with a predominantly sexual mode of transmission: Secondary | ICD-10-CM | POA: Diagnosis not present

## 2019-05-21 DIAGNOSIS — Z1239 Encounter for other screening for malignant neoplasm of breast: Secondary | ICD-10-CM

## 2019-05-21 DIAGNOSIS — N83209 Unspecified ovarian cyst, unspecified side: Secondary | ICD-10-CM

## 2019-05-21 MED ORDER — MEGESTROL ACETATE 20 MG PO TABS: 20.0000 mg | ORAL_TABLET | Freq: Every day | ORAL | 3 refills | Status: DC

## 2019-05-21 NOTE — Progress Notes (Signed)
   Subjective:    Patient ID: Melody Gardner, female    DOB: [birth year], 53 y.o.   MRN: [id number]  HPI 53yo [id number]. History of endometrial ablation about 8 years ago. Had no bleeding for a period of time, then had some light, regular bleeding or spotting. Referred to our office for abnormal bleeding - oligomenorrhea with light bleeding for 2-3 days. Does have some dysmenorrhea with her cycles. Does have some vasomotor symptoms.   Had [place] on 7/20 showing subserosal fibroids, largest is 5cm, but has 2 other 2cm fibroids. Endometrium normal. Does have left cyst - probable hemorrhagic cyst.  I have reviewed the patients past medical, family, and social history.  I have reviewed the patient's medication list and allergies.  Review of Systems     Objective:   Physical Exam Exam conducted with a chaperone present.  Constitutional:      Appearance: Normal appearance.  Cardiovascular:     Rate and Rhythm: Normal rate and regular rhythm.  Pulmonary:     Effort: Pulmonary effort is normal.     Breath sounds: Normal breath sounds.  Abdominal:     General: Abdomen is flat. There is no distension.     Palpations: There is no mass.     Tenderness: There is no abdominal tenderness. There is no guarding or rebound.     Hernia: No hernia is present. There is no hernia in the left inguinal area or right inguinal area.  Genitourinary:    Labia:        Right: No rash, tenderness, lesion or injury.        Left: No rash, tenderness, lesion or injury.      Urethra: No prolapse or urethral swelling.     Vagina: No signs of injury and foreign body. No vaginal discharge, erythema, tenderness, bleeding, lesions or prolapsed vaginal walls.     Cervix: No cervical motion tenderness, discharge, friability, lesion, erythema, cervical bleeding or eversion.     Uterus: Enlarged (12wk sized uterus) and tender. Not deviated, not fixed and no uterine prolapse.      Adnexa:        Right: Tenderness present. No mass or  fullness.         Left: No mass, tenderness or fullness.    Lymphadenopathy:     Lower Body: No right inguinal adenopathy. No left inguinal adenopathy.  Neurological:     Mental Status: She is alert.        Assessment & Plan:  1. Cyst of ovary, unspecified laterality Rpt [place] in about 4 weeks - US PELVIS (TRANSABDOMINAL ONLY); Future  2. Uterine leiomyoma, unspecified location Discussed treatment options - symptomatic control with pain medicine, oral progesterone, IUD, hysterectomy. Because she is close to menopause and has oligomenorrhea, I have a hard time strongly advocating for hysterectomy. Patient would prefer oral progesterone. Discussed potential side effects of megace - weight gain from appetite stimulation, upset stomach, mood swings, etc.   3. 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 - Cytology - PAP( [place])

## 2019-05-21 NOTE — Progress Notes (Signed)
Pt had an abnormal pap 12/13/2016.

## 2019-05-22 NOTE — Addendum Note (Signed)
Addended by: Truett Mainland on: 05/22/2019 03:51 PM   Modules accepted: Orders

## 2019-05-23 LAB — CYTOLOGY - PAP
Chlamydia: NEGATIVE
Diagnosis: NEGATIVE
HPV: NOT DETECTED
Neisseria Gonorrhea: NEGATIVE

## 2019-05-25 ENCOUNTER — Telehealth: Payer: Self-pay

## 2019-05-25 ENCOUNTER — Ambulatory Visit (HOSPITAL_BASED_OUTPATIENT_CLINIC_OR_DEPARTMENT_OTHER): Payer: PRIVATE HEALTH INSURANCE

## 2019-05-25 MED ORDER — METRONIDAZOLE 500 MG PO TABS: 500.0000 mg | ORAL_TABLET | Freq: Two times a day (BID) | ORAL | 0 refills | Status: DC

## 2019-05-25 NOTE — Addendum Note (Signed)
Addended by: Truett Mainland on: 05/25/2019 01:35 PM   Modules accepted: Orders

## 2019-05-25 NOTE — Telephone Encounter (Signed)
-----   Message from Truett Mainland, DO sent at 05/25/2019  1:35 PM EDT ----- PAP normal, Has Trichomonas - flagyl sent to pharmacy. Please caution her not to drink alcohol while on this. Also, partner needs to be treated.

## 2019-05-25 NOTE — Telephone Encounter (Signed)
Called pt regarding PAP  results. Pt made aware that she tested positive for Trichomonas. Flagyl was sent to pharmacy. Pt advised not to drink alcohol while on this medication. Pt also advised not to have sex until ten days after she and partner are treated. Understanding was voiced.  Melody Gardner, CMA

## 2019-06-12 ENCOUNTER — Ambulatory Visit (HOSPITAL_BASED_OUTPATIENT_CLINIC_OR_DEPARTMENT_OTHER): Payer: PRIVATE HEALTH INSURANCE

## 2019-06-19 ENCOUNTER — Encounter: Payer: BLUE CROSS/BLUE SHIELD | Admitting: Family Medicine

## 2019-06-26 ENCOUNTER — Ambulatory Visit (HOSPITAL_BASED_OUTPATIENT_CLINIC_OR_DEPARTMENT_OTHER)
Admission: RE | Admit: 2019-06-26 | Discharge: 2019-06-26 | Disposition: A | Discharge status: Home / Self Care | Payer: PRIVATE HEALTH INSURANCE | Source: Ambulatory Visit | Attending: Family Medicine | Admitting: Family Medicine

## 2019-06-26 ENCOUNTER — Other Ambulatory Visit: Payer: Self-pay

## 2019-06-26 ENCOUNTER — Other Ambulatory Visit: Payer: Self-pay | Admitting: Cardiovascular Disease

## 2019-06-26 DIAGNOSIS — Z1239 Encounter for other screening for malignant neoplasm of breast: Secondary | ICD-10-CM

## 2019-06-26 DIAGNOSIS — Z1231 Encounter for screening mammogram for malignant neoplasm of breast: Secondary | ICD-10-CM | POA: Diagnosis not present

## 2019-07-16 ENCOUNTER — Encounter: Payer: PRIVATE HEALTH INSURANCE | Admitting: Family Medicine

## 2019-07-24 ENCOUNTER — Other Ambulatory Visit: Payer: Self-pay | Admitting: Cardiovascular Disease

## 2019-07-30 ENCOUNTER — Ambulatory Visit: Payer: PRIVATE HEALTH INSURANCE | Admitting: Cardiovascular Disease

## 2019-07-30 ENCOUNTER — Other Ambulatory Visit: Payer: Self-pay

## 2019-07-30 ENCOUNTER — Encounter: Payer: Self-pay | Admitting: Cardiovascular Disease

## 2019-07-30 VITALS — BP 127/80 | HR 82 | Ht 67.0 in | Wt 178.0 lb

## 2019-07-30 DIAGNOSIS — I6523 Occlusion and stenosis of bilateral carotid arteries: Secondary | ICD-10-CM | POA: Diagnosis not present

## 2019-07-30 DIAGNOSIS — Z5181 Encounter for therapeutic drug level monitoring: Secondary | ICD-10-CM

## 2019-07-30 DIAGNOSIS — I10 Essential (primary) hypertension: Secondary | ICD-10-CM | POA: Diagnosis not present

## 2019-07-30 LAB — COMPREHENSIVE METABOLIC PANEL
ALT: 10 IU/L (ref 0–32)
AST: 18 IU/L (ref 0–40)
Albumin/Globulin Ratio: 1.3 (ref 1.2–2.2)
Albumin: 4 g/dL (ref 3.8–4.9)
Alkaline Phosphatase: 60 IU/L (ref 39–117)
BUN/Creatinine Ratio: 13 (ref 9–23)
BUN: 11 mg/dL (ref 6–24)
Bilirubin Total: 0.9 mg/dL (ref 0.0–1.2)
CO2: 23 mmol/L (ref 20–29)
Calcium: 9.5 mg/dL (ref 8.7–10.2)
Chloride: 104 mmol/L (ref 96–106)
Creatinine, Ser: 0.86 mg/dL (ref 0.57–1.00)
GFR calc Af Amer: 89 mL/min/{1.73_m2} (ref >59)
GFR calc non Af Amer: 77 mL/min/{1.73_m2} (ref >59)
Globulin, Total: 3 g/dL (ref 1.5–4.5)
Glucose: 83 mg/dL (ref 65–99)
Potassium: 4.3 mmol/L (ref 3.5–5.2)
Sodium: 140 mmol/L (ref 134–144)
Total Protein: 7 g/dL (ref 6.0–8.5)

## 2019-07-30 LAB — LIPID PANEL
Chol/HDL Ratio: 3.2 ratio (ref 0.0–4.4)
Cholesterol, Total: 120 mg/dL (ref 100–199)
HDL: 37 mg/dL — ABNORMAL LOW (ref >39)
LDL Chol Calc (NIH): 71 mg/dL (ref 0–99)
Triglycerides: 56 mg/dL (ref 0–149)
VLDL Cholesterol Cal: 12 mg/dL (ref 5–40)

## 2019-07-30 MED ORDER — NITROGLYCERIN 0.4 MG SL SUBL: 0.4000 mg | SUBLINGUAL_TABLET | SUBLINGUAL | 99 refills | Status: AC | PRN

## 2019-07-30 NOTE — Progress Notes (Signed)
Cardiology Office Note   Date:  07/30/2019   ID:  Melody Gardner, DOB [birth year], MRN [id number]  PCP:  Carollee Herter, Alferd Apa, DO  Cardiologist:   Skeet Latch, MD   No chief complaint on file.    History of Present Illness: Melody Gardner is a 53 y.o. 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  Melody Gardner was admitted 12/18/15 with chest pain.  Cardiac enzymes were elevated and she had mild lateral ST elevation.  In the cath she was found to have a spontaneous dissection with 100% occlusion of D1 that was medically managed. Her echo showed LVEF 60-65% with grade 1 diastolic dysfunction.   Melody Gardner reports having her renal artery stented when she was around 64.   Given this history, there was concern for fibromuscular dysplasia.  She was referred for carotid ultrasound that revealed bilateral intimal thickening with 1-39% stenosis bilaterally but no plaque.   Melody Gardner underwent CT angiography of her whole body 03/2016.  She was found to have bilateral cervical ICA fibromuscular dysplasia.  Carotid ultrasound revealed 1-39% stenosis bilaterally.  She was evaluated by Dr. Scot Dock 07/04/16 and it was felt that no intervention was necessary. There was no FMD of the renal arteries.  She was also noted to have a 12 mm soft tissue nodule in the right neck suspicious for an ectopic parathyroid adenoma and dental caries.  She had a repeat CT of the head and neck 06/2018 that was unchanged from 2017.  Since her last appointment Melody Gardner has been feeling well.  She had one episode of chest pain that occurred at rest.  She took an aspirin and it helped.  She notes that she has had a lot of gas and belches frequently.  She thinks that her episode of chest pain may have been gas.  There is no associated shortness of breath, nausea, or diaphoresis.  However, she does  note that she gets hot and sweats a lot.  She denies any lower extremity edema, orthopnea, or PND.  She has been trying to get exercise.  She likes to walk twice per week.  She walks for an hour or an hour and a half.  She also walks a lot at work and has no exertional symptoms.  She has been very busy and continues to work 2 jobs.  This makes it more challenging for her to eat healthy.  She checks her blood pressure at home and it has been consistently less than 130/80.   Past Medical History:  Diagnosis Date   Acute MI, lateral wall, initial episode of care ([place]) 12/18/2015   Anemia 2013   Crohn's disease ([place])    Depression    pt states she is depressed but not medically diagnosed   Hypertension 2012   Hypertensive heart disease 12/19/2015    Past Surgical History:  Procedure Laterality Date   CARDIAC CATHETERIZATION N/A 12/18/2015   Procedure: Left Heart Cath and Coronary Angiography;  Surgeon: Sherren Mocha, MD;  Location: [facility];  Service: Cardiovascular;  Laterality: N/A;   ENDOMETRIAL ABLATION  2013   LAPAROSCOPY FOR ECTOPIC PREGNANCY     RENAL ARTERY STENT     TUBAL LIGATION  1989     Current Outpatient Medications  Medication Sig Dispense Refill   amLODipine (NORVASC) 10 MG tablet TAKE 1 TABLET BY MOUTH DAILY 180 tablet 3  aspirin EC 81 MG tablet Take 1 tablet (81 mg total) by mouth daily. 30 tablet 12   azaTHIOprine (IMURAN) 50 MG tablet Take 50 mg by mouth 3 (three) times daily.     balsalazide (COLAZAL) 750 MG capsule Take 2,250 mg by mouth 3 (three) times daily.     fluticasone (FLONASE) 50 MCG/ACT nasal spray Place 1 spray into both nostrils daily. 5 g 0   losartan-hydrochlorothiazide (HYZAAR) 100-12.5 MG tablet TAKE 1 TABLET BY MOUTH DAILY 90 tablet 3   megestrol (MEGACE) 20 MG tablet Take 1 tablet (20 mg total) by mouth daily. 30 tablet 3   metoprolol tartrate (LOPRESSOR) 25 MG tablet TAKE 1 TABLET BY MOUTH TWICE DAILY 60 tablet 0    metroNIDAZOLE (FLAGYL) 500 MG tablet Take 1 tablet (500 mg total) by mouth 2 (two) times daily. 14 tablet 0   Multiple Vitamins-Minerals (WOMENS ONE DAILY PO) Take 1 tablet by mouth daily.     nitroGLYCERIN (NITROSTAT) 0.4 MG SL tablet Place 1 tablet (0.4 mg total) under the tongue every 5 (five) minutes as needed for chest pain. 25 tablet 1   sodium chloride (OCEAN) 0.65 % SOLN nasal spray Place 1 spray into both nostrils as needed for congestion. 15 mL 0   No current facility-administered medications for this visit.     Allergies:   Folic acid, Sulfa antibiotics, and Sulfasalazine    Social History:  The patient  reports that she has never smoked. She has never used smokeless tobacco. She reports that she does not drink alcohol or use drugs.   Family History:  The patient's family history includes Cancer in her brother; Diabetes in her brother and father; Hyperlipidemia in her father; Hypertension in her mother; Neuropathy in her brother and father; Stroke in her father.    ROS:  Please see the history of present illness.   Otherwise, review of systems are positive for none.   All other systems are reviewed and negative.    PHYSICAL EXAM: VS:  BP 127/80    Pulse 82    Ht 5' 7"  (1.702 m)    Wt 178 lb (80.7 kg)    SpO2 99%    BMI 27.88 kg/m  , BMI Body mass index is 27.88 kg/m. GENERAL:  Well appearing HEENT: Pupils equal round and reactive, fundi not visualized, oral mucosa unremarkable NECK:  No jugular venous distention, waveform within normal limits, carotid upstroke brisk and symmetric, no bruits LUNGS:  Clear to auscultation bilaterally HEART:  RRR.  PMI not displaced or sustained,S1 and S2 within normal limits, no S3, no S4, no clicks, no rubs, no murmurs ABD:  Flat, positive bowel sounds normal in frequency in pitch, no bruits, no rebound, no guarding, no midline pulsatile mass, no hepatomegaly, no splenomegaly EXT:  2 plus pulses throughout, no edema, no cyanosis no  clubbing SKIN:  No rashes no nodules NEURO:  Cranial nerves II through XII grossly intact, motor grossly intact throughout PSYCH:  Cognitively intact, oriented to person place and time   EKG:  EKG is not ordered today. 04/26/16: Sinus rhythm rate 79 bpm.  LVH 02/06/18: Sinus rhythm.  Rate 76 bpm.    LHC 12/18/15: 100% D1 due to spontaneous dissection.  No other coronary artery disease.  Echo 12/19/15: Study Conclusions  - Left ventricle: The cavity size was normal. Wall thickness was  normal. Systolic function was normal. The estimated ejection  fraction was in the range of 60% to 65%. Mildly abnormal GLPSS at  -  17%, more regional inferior strain. Wall motion was normal;  there were no regional wall motion abnormalities. Doppler  parameters are consistent with abnormal left ventricular  relaxation (grade 1 diastolic dysfunction). The E/e&' ratio is  between 8-15, suggesting indeterminate LV filling pressure. - Left atrium: The atrium was normal in size. - Tricuspid valve: There was no significant regurgitation. - Inferior vena cava: The vessel was normal in size. The  respirophasic diameter changes were in the normal range (= 50%),  consistent with normal central venous pressure.  Impressions:  - LVEF 60-65%, normal wall thickness, normal wall motion, mildly  abnormal GLPSS at -17% (inferior), Grade 1 DD with indeterminate  LV filling pressure, normal chamber sizes, normal IVC.  Carotid Doppler 01/12/16: Intimal thickening bilaterally.  1-39% ICA stenosis bilaterally.    Recent Labs: No results found for requested labs within last 8760 hours.    Lipid Panel    Component Value Date/Time   CHOL 147 06/17/2018 1015   TRIG 78.0 06/17/2018 1015   HDL 45.40 06/17/2018 1015   CHOLHDL 3 06/17/2018 1015   VLDL 15.6 06/17/2018 1015   LDLCALC 86 06/17/2018 1015      Wt Readings from Last 3 Encounters:  07/30/19 178 lb (80.7 kg)  05/21/19 177 lb (80.3 kg)   01/30/19 172 lb (78 kg)      ASSESSMENT AND PLAN:  # Spontaneous coronary artery dissection:  # Fibromuscular dysplasia: # ICA stenosis: Melody Gardner had SCAD of D1 and has bilateral ICA stenosis.  CT-A stable 06/2018.  She is due for fasting lipids and a CMP today.  She did have one episode of chest pain but it seems it was more related to GERD and gas.  She has no exertional symptoms.  We will refill her nitroglycerin today.  # Hypertension:  Blood pressure is well have been controlled.  Continue amlodipine, losartan, metoprolol, and HCTZ.  Check BMP today.   Current medicines are reviewed at length with the patient today.  The patient does not have concerns regarding medicines.  The following changes have been made:  none  Labs/ tests ordered today include:   No orders of the defined types were placed in this encounter.    Disposition:   FU with Subrina Vecchiarelli C. Oval Linsey, MD, [facility] in 1 year.    Signed, Zarianna Dicarlo C. Oval Linsey, MD, [facility]  07/30/2019 9:33 AM    [place]

## 2019-07-30 NOTE — Patient Instructions (Signed)
Medication Instructions:  Your physician recommends that you continue on your current medications as directed. Please refer to the Current Medication list given to you today.  *If you need a refill on your cardiac medications before your next appointment, please call your pharmacy*  Lab Work: LP/CMET TODAY  If you have labs (blood work) drawn today and your tests are completely normal, you will receive your results only by: Marland Kitchen MyChart Message (if you have MyChart) OR . A paper copy in the mail If you have any lab test that is abnormal or we need to change your treatment, we will call you to review the results.  Testing/Procedures: NONE  Follow-Up: At [facility], you and your health needs are our priority.  As part of our continuing mission to provide you with exceptional heart care, we have created designated Provider Care Teams.  These Care Teams include your primary Cardiologist (physician) and Advanced Practice Providers (APPs -  Physician Assistants and Nurse Practitioners) who all work together to provide you with the care you need, when you need it.  Your next appointment:   12 months You will receive a reminder letter in the mail two months in advance. If you don't receive a letter, please call our office to schedule the follow-up appointment.  The format for your next appointment:   Either In Person or Virtual  Provider:   You may see Skeet Latch, MD or one of the following Advanced Practice Providers on your designated Care Team:    Kerin Ransom, PA-C  [place], [place]  [name], [place]

## 2019-08-07 ENCOUNTER — Encounter: Payer: Self-pay | Admitting: Cardiovascular Disease

## 2019-09-10 ENCOUNTER — Other Ambulatory Visit: Payer: Self-pay

## 2019-09-11 ENCOUNTER — Ambulatory Visit (INDEPENDENT_AMBULATORY_CARE_PROVIDER_SITE_OTHER): Payer: PRIVATE HEALTH INSURANCE | Admitting: Family Medicine

## 2019-09-11 ENCOUNTER — Encounter: Payer: Self-pay | Admitting: Family Medicine

## 2019-09-11 ENCOUNTER — Other Ambulatory Visit: Payer: Self-pay

## 2019-09-11 VITALS — BP 112/74 | HR 78 | Temp 97.4°F | Resp 18 | Ht 67.0 in | Wt 173.4 lb

## 2019-09-11 DIAGNOSIS — Z0000 Encounter for general adult medical examination without abnormal findings: Secondary | ICD-10-CM | POA: Diagnosis not present

## 2019-09-11 NOTE — Progress Notes (Signed)
Subjective:     Melody Gardner is a 53 y.o. female and is here for a comprehensive physical exam. The patient reports no problems.  Social History   Socioeconomic History  . Marital status: Legally Separated    Spouse name: Not on file  . Number of children: Not on file  . Years of education: Not on file  . Highest education level: Not on file  Occupational History    Comment: [employer]  Tobacco Use  . Smoking status: Never Smoker  . Smokeless tobacco: Never Used  Substance and Sexual Activity  . Alcohol use: No  . Drug use: No  . Sexual activity: Yes    Birth control/protection: Surgical  Other Topics Concern  . Not on file  Social History Narrative  . Not on file   Social Determinants of Health   Financial Resource Strain:   . Difficulty of Paying Living Expenses: Not on file  Food Insecurity:   . Worried About Charity fundraiser in the Last Year: Not on file  . Ran Out of Food in the Last Year: Not on file  Transportation Needs:   . Lack of Transportation (Medical): Not on file  . Lack of Transportation (Non-Medical): Not on file  Physical Activity:   . Days of Exercise per Week: Not on file  . Minutes of Exercise per Session: Not on file  Stress:   . Feeling of Stress : Not on file  Social Connections:   . Frequency of Communication with Friends and Family: Not on file  . Frequency of Social Gatherings with Friends and Family: Not on file  . Attends Religious Services: Not on file  . Active Member of Clubs or Organizations: Not on file  . Attends Archivist Meetings: Not on file  . Marital Status: Not on file  Intimate Partner Violence:   . Fear of Current or Ex-Partner: Not on file  . Emotionally Abused: Not on file  . Physically Abused: Not on file  . Sexually Abused: Not on file   Health Maintenance  Topic Date Due  . INFLUENZA VACCINE  02/04/2020 (Originally 05/02/2019)  . MAMMOGRAM  06/25/2020  .  PAP SMEAR-Modifier  05/20/2022  . TETANUS/TDAP  01/07/2024  . COLONOSCOPY  09/22/2025  . HIV Screening  Completed    The following portions of the patient's history were reviewed and updated as appropriate: She  has a past medical history of Acute MI, lateral wall, initial episode of care [facility]) (12/18/2015), Anemia (2013), Crohn's disease ([place]), Depression, Hypertension (2012), and Hypertensive heart disease (12/19/2015). She does not have any pertinent problems on file. She  has a past surgical history that includes Renal artery stent; Laparoscopy for ectopic pregnancy; Tubal ligation (1989); Endometrial ablation (2013); and Cardiac catheterization (N/A, 12/18/2015). Her family history includes Cancer in her brother; Diabetes in her brother and father; Hyperlipidemia in her father; Hypertension in her mother; Neuropathy in her brother and father; Stroke in her father. She  reports that she has never smoked. She has never used smokeless tobacco. She reports that she does not drink alcohol or use drugs. She has a current medication list which includes the following prescription(s): amlodipine, aspirin ec, azathioprine, balsalazide, fluticasone, losartan-hydrochlorothiazide, megestrol, metoprolol tartrate, metronidazole, multiple vitamins-minerals, nitroglycerin, and sodium chloride. Current Outpatient Medications on File Prior to Visit  Medication Sig Dispense Refill  . amLODipine (NORVASC) 10  MG tablet TAKE 1 TABLET BY MOUTH DAILY 180 tablet 3  . aspirin EC 81 MG tablet Take 1 tablet (81 mg total) by mouth daily. 30 tablet 12  . azaTHIOprine (IMURAN) 50 MG tablet Take 50 mg by mouth 3 (three) times daily.    . balsalazide (COLAZAL) 750 MG capsule Take 2,250 mg by mouth 3 (three) times daily.    . fluticasone (FLONASE) 50 MCG/ACT nasal spray Place 1 spray into both nostrils daily. 5 g 0  . losartan-hydrochlorothiazide (HYZAAR) 100-12.5 MG tablet TAKE 1 TABLET BY MOUTH DAILY 90 tablet 3  . megestrol  (MEGACE) 20 MG tablet Take 1 tablet (20 mg total) by mouth daily. 30 tablet 3  . metoprolol tartrate (LOPRESSOR) 25 MG tablet TAKE 1 TABLET BY MOUTH TWICE DAILY 60 tablet 0  . metroNIDAZOLE (FLAGYL) 500 MG tablet Take 1 tablet (500 mg total) by mouth 2 (two) times daily. 14 tablet 0  . Multiple Vitamins-Minerals (WOMENS ONE DAILY PO) Take 1 tablet by mouth daily.    . nitroGLYCERIN (NITROSTAT) 0.4 MG SL tablet Place 1 tablet (0.4 mg total) under the tongue every 5 (five) minutes as needed for chest pain. 25 tablet PRN  . sodium chloride (OCEAN) 0.65 % SOLN nasal spray Place 1 spray into both nostrils as needed for congestion. 15 mL 0   No current facility-administered medications on file prior to visit.   She is allergic to folic acid; sulfa antibiotics; and sulfasalazine..  Review of Systems Review of Systems  Constitutional: Negative for activity change, appetite change and fatigue.  HENT: Negative for hearing loss, congestion, tinnitus and ear discharge.  dentist q34mEyes: Negative for visual disturbance (see optho q1y -- vision corrected to 20/20 with glasses).  Respiratory: Negative for cough, chest tightness and shortness of breath.   Cardiovascular: Negative for chest pain, palpitations and leg swelling.  Gastrointestinal: Negative for abdominal pain, diarrhea, constipation and abdominal distention.  Genitourinary: Negative for urgency, frequency, decreased urine volume and difficulty urinating.  Musculoskeletal: Negative for back pain, arthralgias and gait problem.  Skin: Negative for color change, pallor and rash.  Neurological: Negative for dizziness, light-headedness, numbness and headaches.  Hematological: Negative for adenopathy. Does not bruise/bleed easily.  Psychiatric/Behavioral: Negative for suicidal ideas, confusion, sleep disturbance, self-injury, dysphoric mood, decreased concentration and agitation.       Objective:    BP 112/74 (BP Location: Right Arm, Patient  Position: Sitting, Cuff Size: Normal)   Pulse 78   Temp (!) 97.4 F (36.3 C) (Temporal)   Resp 18   Ht 5' 7"  (1.702 m)   Wt 173 lb 6.4 oz (78.7 kg)   SpO2 99%   BMI 27.16 kg/m  General appearance: alert, cooperative, appears stated age and no distress Head: Normocephalic, without obvious abnormality, atraumatic Eyes: conjunctivae/corneas clear. PERRL, EOM's intact. Fundi benign. Ears: normal TM's and external ear canals both ears Neck: no adenopathy, no carotid bruit, no JVD, supple, symmetrical, trachea midline and thyroid not enlarged, symmetric, no tenderness/mass/nodules Back: symmetric, no curvature. ROM normal. No CVA tenderness. Lungs: clear to auscultation bilaterally Breasts: gyn Heart: regular rate and rhythm, S1, S2 normal, no murmur, click, rub or gallop Abdomen: soft, non-tender; bowel sounds normal; no masses,  no organomegaly Pelvic: deferred-gyn Extremities: extremities normal, atraumatic, no cyanosis or edema Pulses: 2+ and symmetric Skin: Skin color, texture, turgor normal. No rashes or lesions Lymph nodes: Cervical, supraclavicular, and axillary nodes normal. Neurologic: Alert and oriented X 3, normal strength and tone. Normal symmetric reflexes. Normal  coordination and gait    Assessment:    Healthy female exam.      Plan:    ghm utd Check labs  See After Visit Summary for Counseling Recommendations

## 2019-09-11 NOTE — Patient Instructions (Signed)

## 2019-09-12 LAB — COMPREHENSIVE METABOLIC PANEL
AG Ratio: 1.1 (calc) (ref 1.0–2.5)
ALT: 12 U/L (ref 6–29)
AST: 19 U/L (ref 10–35)
Albumin: 3.8 g/dL (ref 3.6–5.1)
Alkaline phosphatase (APISO): 64 U/L (ref 37–153)
BUN: 12 mg/dL (ref 7–25)
CO2: 24 mmol/L (ref 20–32)
Calcium: 9.7 mg/dL (ref 8.6–10.4)
Chloride: 105 mmol/L (ref 98–110)
Creat: 0.97 mg/dL (ref 0.50–1.05)
Globulin: 3.4 g/dL (calc) (ref 1.9–3.7)
Glucose, Bld: 77 mg/dL (ref 65–99)
Potassium: 3.6 mmol/L (ref 3.5–5.3)
Sodium: 141 mmol/L (ref 135–146)
Total Bilirubin: 0.9 mg/dL (ref 0.2–1.2)
Total Protein: 7.2 g/dL (ref 6.1–8.1)

## 2019-09-12 LAB — CBC WITH DIFFERENTIAL/PLATELET
Absolute Monocytes: 409 cells/uL (ref 200–950)
Basophils Absolute: 68 cells/uL (ref 0–200)
Basophils Relative: 1.1 %
Eosinophils Absolute: 99 cells/uL (ref 15–500)
Eosinophils Relative: 1.6 %
HCT: 38.8 % (ref 35.0–45.0)
Hemoglobin: 12.8 g/dL (ref 11.7–15.5)
Lymphs Abs: 1575 cells/uL (ref 850–3900)
MCH: 29.9 pg (ref 27.0–33.0)
MCHC: 33 g/dL (ref 32.0–36.0)
MCV: 90.7 fL (ref 80.0–100.0)
MPV: 9.1 fL (ref 7.5–12.5)
Monocytes Relative: 6.6 %
Neutro Abs: 4049 cells/uL (ref 1500–7800)
Neutrophils Relative %: 65.3 %
Platelets: 443 10*3/uL — ABNORMAL HIGH (ref 140–400)
RBC: 4.28 10*6/uL (ref 3.80–5.10)
RDW: 13 % (ref 11.0–15.0)
Total Lymphocyte: 25.4 %
WBC: 6.2 10*3/uL (ref 3.8–10.8)

## 2019-09-12 LAB — LIPID PANEL
Cholesterol: 110 mg/dL (ref <200)
HDL: 34 mg/dL — ABNORMAL LOW (ref >=50)
LDL Cholesterol (Calc): 60 mg/dL (calc)
Non-HDL Cholesterol (Calc): 76 mg/dL (calc) (ref <130)
Total CHOL/HDL Ratio: 3.2 (calc) (ref <5.0)
Triglycerides: 81 mg/dL (ref <150)

## 2019-09-12 LAB — TSH: TSH: 1.27 mIU/L

## 2019-10-02 ENCOUNTER — Other Ambulatory Visit: Payer: Self-pay | Admitting: Cardiovascular Disease

## 2019-10-03 ENCOUNTER — Other Ambulatory Visit: Payer: Self-pay | Admitting: Family Medicine

## 2019-10-07 ENCOUNTER — Other Ambulatory Visit: Payer: Self-pay

## 2019-10-07 MED ORDER — METOPROLOL TARTRATE 25 MG PO TABS: 25.0000 mg | ORAL_TABLET | Freq: Two times a day (BID) | ORAL | 3 refills | Status: DC

## 2019-11-18 ENCOUNTER — Telehealth: Payer: Self-pay | Admitting: Cardiovascular Disease

## 2019-11-18 NOTE — Telephone Encounter (Signed)
Patient calling stating she needs a letter for work stating she declined the covid vaccine.

## 2019-11-18 NOTE — Telephone Encounter (Signed)
Returned call to patient she stated she works at [employer].Stated they require her to have covid vaccine.Stated she does not want to take due to her heart condition.She would like Dr.[place] to write a letter saying she does not need to take.Advised I will send message to Center for advice.

## 2019-11-19 NOTE — Telephone Encounter (Signed)
Advised patient, verbalized understanding  

## 2019-11-19 NOTE — Telephone Encounter (Signed)
I cannot say this.  She needs the vaccine BECAUSE of her heart condition.  There is no reason to think that the vaccine is unsafe for her.  I recommend that she get it and felt comfortable getting it myself.

## 2020-02-21 ENCOUNTER — Other Ambulatory Visit: Payer: Self-pay | Admitting: Family Medicine

## 2020-02-23 ENCOUNTER — Other Ambulatory Visit: Payer: Self-pay | Admitting: Cardiology

## 2020-02-23 ENCOUNTER — Other Ambulatory Visit: Payer: Self-pay

## 2020-02-24 ENCOUNTER — Other Ambulatory Visit: Payer: Self-pay

## 2020-02-24 DIAGNOSIS — N939 Abnormal uterine and vaginal bleeding, unspecified: Secondary | ICD-10-CM

## 2020-02-24 MED ORDER — MEGESTROL ACETATE 20 MG PO TABS: ORAL_TABLET | ORAL | 3 refills | Status: DC

## 2020-02-24 NOTE — Progress Notes (Signed)
Error

## 2020-02-24 NOTE — Progress Notes (Addendum)
Pt called requesting a refill of Megace 20 mg. A [place] was ordered on 05/21/19. Pt was advised to f/u 4 weeks after [place]. Pt made aware that she will need to have a [place] then f/u in the office. Pt states she will call to schedule her [place]. Megace 20 mg was sent to pt's pharmacy. Nancy Manuele l Norina Cowper, CMA

## 2020-02-26 ENCOUNTER — Other Ambulatory Visit: Payer: Self-pay

## 2020-03-14 ENCOUNTER — Telehealth: Payer: Self-pay | Admitting: Cardiovascular Disease

## 2020-03-14 NOTE — Telephone Encounter (Signed)
Return call to pt. She report since last week she has noticed bilateral ankle edema. She also report SOB last week but it since improved. Pt report she does not weigh herself daily and voiced she has probably gained roughly 11 lbs in a month but it could be a result from eating.   Appointment has an appointment scheduled for 6/28 with Jory Sims, DNP. Will route to MD for any further recommendations.

## 2020-03-14 NOTE — Telephone Encounter (Signed)
Pt c/o swelling: STAT is pt has developed SOB within 24 hours  1) How much weight have you gained and in what time span? 11 pounds in a month - states she is unsure if its from the swelling or her eating.   2) If swelling, where is the swelling located? Both ankles. Especially the left.   3) Are you currently taking a fluid pill? No   4) Are you currently SOB? Not anymore, was SOB last month.   5) Do you have a log of your daily weights (if so, list)? no  6) Have you gained 3 pounds in a day or 5 pounds in a week? no  7) Have you traveled recently? No    Patient scheduled for 03/28/20 with Jory Sims.

## 2020-03-14 NOTE — Telephone Encounter (Signed)
OK thank you 

## 2020-03-14 NOTE — Telephone Encounter (Signed)
Pt made aware to keep scheduled appointment on 6/28 with Jory Sims, DNP.

## 2020-03-27 NOTE — Progress Notes (Deleted)
Cardiology Office Note   Date:  03/27/2020   ID:  Melody Gardner, DOB 1965-11-21, MRN [id number]  PCP:  Ann Held, DO  Cardiologist: Dr. Oval Linsey No chief complaint on file.    History of Present Illness: Melody Gardner is a 54 y.o. 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  Cardiac catheterization in 2017 was found to have spontaneous dissection with 100% occlusion of the diagonal 1 that was medically managed.  Echocardiogram at that time revealed LVEF of 60 to 65% with grade 1 diastolic dysfunction.  She did have a carotid ultrasound which revealed bilateral intimal thickening with 1 to 39% stenosis bilaterally but no plaque.  In 2017 she also had a CT angiography of her whole body.  She was found at that time to have ICA fibromuscular dysplasia.  She was also noted to have a 12 mm soft tissue nodule in the right neck suspicious for ectopic parathyroid adenoma and dental caries.  She was treated medically.  Repeat CT scan of the head and neck in 2019 was unchanged.  On last office visit on 07/30/2019 she did complain of one episode of chest pain which she felt might have been gas.  She has a lot of gas and GI symptoms with belching.  She was exercising and walks at least twice a week for an hour or an hour and a half, and also walks a lot at work with no exertional symptoms.  She is working 2 jobs.  On last office visit dated 07/30/2019 with Dr. Oval Linsey she was due for fasting lipids and LFTs.  She was to follow-up with GI concerning her GERD and gas.  Blood pressure was well controlled.  Review of lipids and LFTs revealed an LDL of 60.  She called our office on 03/14/2020 with complaints of 11 pound weight gain and lower  extremity edema.  Appointment was made with me today to evaluate further.  Past Medical History:  Diagnosis Date  . Abnormal uterine bleeding 01/30/2012   Benign endometrial biopsy, showed endometrial polyps   . Acute MI, lateral wall, initial episode of care ([place]) 12/18/2015  . Anemia 2013  . Carotid artery disease ([place]) 01/30/2019   Mild 1-39%-  by CTA 9/19  . 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 Crohn's disease ([place])   . Crohn's disease of both small and large intestine without complication ([place]) 10/25/5807  . Crohn's disease with complication ([place]) 9/83/3825  . Depression    pt states she is depressed but not medically diagnosed  . Endometrial polyp 01/31/2012   On endometrial biopsy done 01/30/12   . Fibroids 01/30/2012  . Hematochezia 03/05/2016  . Hypertension 2012  . Hypertensive heart disease 12/19/2015  . Plantar fasciitis 01/02/2019  . S/P hydrothermal endometrial ablation on 03/31/2012 03/31/2012    Past Surgical History:  Procedure Laterality Date  . CARDIAC CATHETERIZATION N/A 12/18/2015   Procedure: Left Heart Cath and Coronary Angiography;  Surgeon: Sherren Mocha, MD;  Location: [place] [facility];  Service: Cardiovascular;  Laterality: N/A;  . ENDOMETRIAL ABLATION  2013  . LAPAROSCOPY FOR ECTOPIC PREGNANCY    . RENAL ARTERY STENT    . [place]  Current Outpatient Medications  Medication Sig Dispense Refill  . amLODipine (NORVASC) 10 MG tablet TAKE 1 TABLET BY MOUTH DAILY 180 tablet 3  . aspirin EC 81 MG tablet Take 1 tablet (81 mg total) by mouth daily. 30 tablet 12  . azaTHIOprine (IMURAN) 50 MG tablet Take 50 mg by mouth 3 (three) times daily.    . balsalazide (COLAZAL) 750 MG capsule Take 2,250 mg by mouth 3 (three) times daily.    . fluticasone (FLONASE) 50 MCG/ACT nasal spray Place 1 spray into both nostrils daily. 5 g 0  . losartan-hydrochlorothiazide (HYZAAR) 100-12.5 MG tablet Take 1 tablet by  mouth daily. 90 tablet 1  . megestrol (MEGACE) 20 MG tablet TAKE 1 TABLET(20 MG) BY MOUTH DAILY 30 tablet 3  . metoprolol tartrate (LOPRESSOR) 25 MG tablet Take 1 tablet (25 mg total) by mouth 2 (two) times daily. 180 tablet 3  . metroNIDAZOLE (FLAGYL) 500 MG tablet Take 1 tablet (500 mg total) by mouth 2 (two) times daily. 14 tablet 0  . Multiple Vitamins-Minerals (WOMENS ONE DAILY PO) Take 1 tablet by mouth daily.    . nitroGLYCERIN (NITROSTAT) 0.4 MG SL tablet Place 1 tablet (0.4 mg total) under the tongue every 5 (five) minutes as needed for chest pain. 25 tablet PRN  . sodium chloride (OCEAN) 0.65 % SOLN nasal spray Place 1 spray into both nostrils as needed for congestion. 15 mL 0   No current facility-administered medications for this visit.    Allergies:   Folic acid, Sulfa antibiotics, and Sulfasalazine    Social History:  The patient  reports that she has never smoked. She has never used smokeless tobacco. She reports that she does not drink alcohol and does not use drugs.   Family History:  The patient's family history includes Cancer in her brother; Diabetes in her brother and father; Hyperlipidemia in her father; Hypertension in her mother; Neuropathy in her brother and father; Stroke in her father.    ROS: All other systems are reviewed and negative. Unless otherwise mentioned in H&P    PHYSICAL EXAM: VS:  There were no vitals taken for this visit. , BMI There is no height or weight on file to calculate BMI. GEN: Well nourished, well developed, in no acute distress HEENT: normal Neck: no JVD, carotid bruits, or masses Cardiac: ***RRR; no murmurs, rubs, or gallops,no edema  Respiratory:  Clear to auscultation bilaterally, normal work of breathing GI: soft, nontender, nondistended, + BS MS: no deformity or atrophy Skin: warm and dry, no rash Neuro:  Strength and sensation are intact Psych: euthymic mood, full affect   EKG:  EKG {ACTION; IS/IS [id number]} ordered  today. The ekg ordered today demonstrates ***   Recent Labs: 09/11/2019: ALT 12; BUN 12; Creat 0.97; Hemoglobin 12.8; Platelets 443; Potassium 3.6; Sodium 141; TSH 1.27    Lipid Panel    Component Value Date/Time   CHOL 110 09/11/2019 1449   CHOL 120 07/30/2019 1010   TRIG 81 09/11/2019 1449   HDL 34 (L) 09/11/2019 1449   HDL 37 (L) 07/30/2019 1010   CHOLHDL 3.2 09/11/2019 1449   VLDL 15.6 06/17/2018 1015   LDLCALC 60 09/11/2019 1449      Wt Readings from Last 3 Encounters:  09/11/19 173 lb 6.4 oz (78.7 kg)  07/30/19 178 lb (80.7 kg)  05/21/19 177 lb (80.3 kg)      Other studies Reviewed: LHC 12/18/15: 100% D1 due to spontaneous dissection.  No other coronary artery disease.  Echo 12/19/15: Study Conclusions  - Left ventricle: The cavity size was normal. Wall thickness was  normal. Systolic function was normal. The estimated ejection  fraction was in the range of 60% to 65%. Mildly abnormal GLPSS at  -17%, more regional inferior strain. Wall motion was normal;  there were no regional wall motion abnormalities. Doppler  parameters are consistent with abnormal left ventricular  relaxation (grade 1 diastolic dysfunction). The E/e&' ratio is  between 8-15, suggesting indeterminate LV filling pressure. - Left atrium: The atrium was normal in size. - Tricuspid valve: There was no significant regurgitation. - Inferior vena cava: The vessel was normal in size. The  respirophasic diameter changes were in the normal range (= 50%),  consistent with normal central venous pressure.  Impressions:  - LVEF 60-65%, normal wall thickness, normal wall motion, mildly  abnormal GLPSS at -17% (inferior), Grade 1 DD with indeterminate  LV filling pressure, normal chamber sizes, normal IVC.  Carotid Doppler 01/12/16: Intimal thickening bilaterally.  1-39% ICA stenosis bilaterally.     ASSESSMENT AND PLAN:  1.  ***   Current medicines are reviewed at length with  the patient today.  I have spent *** dedicated to the care of this patient on the date of this encounter to include pre-visit review of records, assessment, management and diagnostic testing,with shared decision making.  Labs/ tests ordered today include: *** Phill Myron. West Pugh, ANP, [facility]   03/27/2020 7:17 AM    [place] [address] Office [phone] Fax [phone]  Notice: This dictation was prepared with Dragon dictation along with smaller phrase technology. Any transcriptional errors that result from this process are unintentional and may not be corrected upon review.

## 2020-03-28 ENCOUNTER — Ambulatory Visit: Payer: PRIVATE HEALTH INSURANCE | Admitting: Adult Health

## 2020-05-03 ENCOUNTER — Ambulatory Visit: Payer: PRIVATE HEALTH INSURANCE | Admitting: Cardiology

## 2020-05-24 ENCOUNTER — Encounter: Payer: Self-pay | Admitting: Family Medicine

## 2020-05-24 ENCOUNTER — Other Ambulatory Visit: Payer: Self-pay

## 2020-05-24 ENCOUNTER — Telehealth (INDEPENDENT_AMBULATORY_CARE_PROVIDER_SITE_OTHER): Payer: PRIVATE HEALTH INSURANCE | Admitting: Family Medicine

## 2020-05-24 VITALS — BP 114/80 | HR 84 | Temp 99.8°F

## 2020-05-24 DIAGNOSIS — J208 Acute bronchitis due to other specified organisms: Secondary | ICD-10-CM

## 2020-05-24 MED ORDER — AZITHROMYCIN 250 MG PO TABS: ORAL_TABLET | ORAL | 0 refills | Status: DC

## 2020-05-24 MED ORDER — PROMETHAZINE-DM 6.25-15 MG/5ML PO SYRP: 5.0000 mL | ORAL_SOLUTION | Freq: Four times a day (QID) | ORAL | 0 refills | Status: DC | PRN

## 2020-05-24 NOTE — Progress Notes (Signed)
Virtual Visit via Video Note  I connected with [name] on 05/24/20 at  4:40 PM EDT by a video enabled telemedicine application and verified that I am speaking with the correct person using two identifiers.  Location: Patient: home alone  Provider: office    I discussed the limitations of evaluation and management by telemedicine and the availability of in person appointments. The patient expressed understanding and agreed to proceed.  History of Present Illness: Pt is home c/o headache, bodyaches, cough since the weekend ,   Temp 99.8      Highest 100.7 + prod cough and wheezing  Observations/Objective: Vitals:   05/24/20 1624  BP: 114/80  Pulse: 84  Temp: 99.8 F (37.7 C)   pt is in nad  Laying in bed We had to change to tele visit due to pt mic not working    Assessment and Plan: 1. Acute viral bronchitis Pt to get covid test  abx per orders and phenergan dm Call back once she gets covid results  If symptoms worsen--- go to eR - azithromycin (ZITHROMAX Z-PAK) 250 MG tablet; As directed  Dispense: 6 each; Refill: 0 - promethazine-dextromethorphan (PROMETHAZINE-DM) 6.25-15 MG/5ML syrup; Take 5 mLs by mouth 4 (four) times daily as needed.  Dispense: 118 mL; Refill: 0  Follow Up Instructions:    I discussed the assessment and treatment plan with the patient. The patient was provided an opportunity to ask questions and all were answered. The patient agreed with the plan and demonstrated an understanding of the instructions.   The patient was advised to call back or seek an in-person evaluation if the symptoms worsen or if the condition fails to improve as anticipated.  I provided 25 minutes of non-face-to-face time during this encounter.   Ann Held, DO

## 2020-05-26 ENCOUNTER — Telehealth: Payer: Self-pay | Admitting: Family Medicine

## 2020-05-26 NOTE — Telephone Encounter (Signed)
Please fax work note to her HR at [phone] attn Albin Fischer. Staes she needs one from her VV earlier this week.  Also states her covid test was negative.

## 2020-05-27 NOTE — Telephone Encounter (Signed)
Letter created and faxed to HR.

## 2020-06-23 ENCOUNTER — Other Ambulatory Visit: Payer: Self-pay | Admitting: Cardiology

## 2020-07-05 ENCOUNTER — Telehealth: Payer: Self-pay | Admitting: Cardiovascular Disease

## 2020-07-05 NOTE — Telephone Encounter (Signed)
lvm for patient to return call to get follow up scheduled with Oval Linsey from recall list

## 2020-08-01 ENCOUNTER — Ambulatory Visit: Payer: PRIVATE HEALTH INSURANCE | Admitting: General Practice

## 2020-08-01 ENCOUNTER — Other Ambulatory Visit: Payer: Self-pay | Admitting: Family Medicine

## 2020-08-01 DIAGNOSIS — N939 Abnormal uterine and vaginal bleeding, unspecified: Secondary | ICD-10-CM

## 2020-08-01 NOTE — Progress Notes (Deleted)
Cardiology Clinic Note   Patient Name: Melody Gardner Date of Encounter: 08/01/2020  Primary Care Provider:  Carollee Herter, Alferd Apa, DO Primary Cardiologist:  Skeet Latch, MD  Patient Profile    Melody Gardner 54 year old female presents the clinic today for follow-up evaluation of her hypertension, spontaneous coronary artery dissection, and shortness of breath.  Past Medical History    Past Medical History:  Diagnosis Date  . Abnormal uterine bleeding 01/30/2012   Benign endometrial biopsy, showed endometrial polyps   . Acute MI, lateral wall, initial episode of care ([place]) 12/18/2015  . Anemia 2013  . Carotid artery disease ([place]) 01/30/2019   Mild 1-39%-  by CTA 9/19  . 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 Crohn's disease ([place])   . Crohn's disease of both small and large intestine without complication ([place]) 04/06/2375  . Crohn's disease with complication ([place]) 2/83/1517  . Depression    pt states she is depressed but not medically diagnosed  . Endometrial polyp 01/31/2012   On endometrial biopsy done 01/30/12   . Fibroids 01/30/2012  . Hematochezia 03/05/2016  . Hypertension 2012  . Hypertensive heart disease 12/19/2015  . Plantar fasciitis 01/02/2019  . S/P hydrothermal endometrial ablation on 03/31/2012 03/31/2012   Past Surgical History:  Procedure Laterality Date  . CARDIAC CATHETERIZATION N/A 12/18/2015   Procedure: Left Heart Cath and Coronary Angiography;  Surgeon: Sherren Mocha, MD;  Location: [facility];  Service: Cardiovascular;  Laterality: N/A;  . ENDOMETRIAL ABLATION  2013  . LAPAROSCOPY FOR ECTOPIC PREGNANCY    . RENAL ARTERY STENT    . TUBAL LIGATION  1989    Allergies  Allergies  Allergen Reactions  . Folic Acid Rash    Pt declines this allergy  . Sulfa Antibiotics Rash  . Sulfasalazine Itching and Rash    History of Present Illness    Ms. 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  She was admitted 3/17 with chest pain.  Her cardiac enzymes were elevated with mild lateral ST elevation.  Cardiac catheterization showed a spontaneous dissection with 100% occlusion of D1 which was medically managed.  Echocardiogram showed an LVEF of 60-70%, G1 DD.  She reported having renal artery stented when she was around 40.  Due to her history she was referred for carotid ultrasound which showed bilateral intimal thickening 1-39% stenosis, no plaque was noted.  She underwent CTA of her whole body 7/17 and was found to have bilateral cervical ICA fibromuscular dysplasia.  Carotid ultrasound showed 1-39% stenosis bilaterally.  She was evaluated by Dr. Doren Custard 10/17 and it was felt no intervention was necessary.  There was no FND noted of the renal arteries.  A 12 mm soft tissue nodule in her right neck suspicious for atrophic parathyroid adenoma and dental caries was seen.  Repeat CT of her head neck 9/19 showed unchanged images from 2017.  She was last seen by Dr. Oval Linsey on 07/30/2019.  During that time she was feeling well.  She had 1 episode of chest pain that occurred at rest.  She took an aspirin which helped to relieve her symptoms.  She was noted to have had increased gas and was belching frequently.  She felt that this may have contributed to her episode of chest discomfort.  There is no associated shortness of breath, nausea,  or diaphoresis with the episode.  She did note at times she gets hot and does have some diaphoresis.  She denied lower extremity edema, orthopnea, and PND.  She was trying to become more physically active.  She was walking twice per week for 1.5 hours.  She indicated that she was walking at work and did not have any exertional chest discomfort.  She was working 2 jobs.  Her schedule made it more difficult for her to follow a healthy diet.  Her blood  pressure at home is consistently in the 130s over 80s.  She presents the clinic today for follow-up evaluation states***  *** denies chest pain, shortness of breath, lower extremity edema, fatigue, palpitations, melena, hematuria, hemoptysis, diaphoresis, weakness, presyncope, syncope, orthopnea, and PND.   Home Medications    Prior to Admission medications   Medication Sig Start Date End Date Taking? Authorizing Provider  amLODipine (NORVASC) 10 MG tablet TAKE 1 TABLET BY MOUTH DAILY 06/29/19   Skeet Latch, MD  aspirin EC 81 MG tablet Take 1 tablet (81 mg total) by mouth daily. 12/22/15   Arbutus Leas, NP  azaTHIOprine (IMURAN) 50 MG tablet Take 50 mg by mouth 3 (three) times daily.    [provider]  azithromycin (ZITHROMAX Z-PAK) 250 MG tablet As directed 05/24/20   Carollee Herter, Alferd Apa, DO  balsalazide (COLAZAL) 750 MG capsule Take 2,250 mg by mouth 3 (three) times daily.    [provider]  fluticasone (FLONASE) 50 MCG/ACT nasal spray Place 1 spray into both nostrils daily. 12/13/18   Law, Bea Graff, PA-C  losartan-hydrochlorothiazide (HYZAAR) 100-12.5 MG tablet TAKE 1 TABLET BY MOUTH DAILY 06/23/20   Erlene Quan, PA-C  megestrol (MEGACE) 20 MG tablet TAKE 1 TABLET(20 MG) BY MOUTH DAILY 02/24/20   Truett Mainland, DO  metoprolol tartrate (LOPRESSOR) 25 MG tablet Take 1 tablet (25 mg total) by mouth 2 (two) times daily. 10/07/19   Skeet Latch, MD  metroNIDAZOLE (FLAGYL) 500 MG tablet Take 1 tablet (500 mg total) by mouth 2 (two) times daily. 05/25/19   Truett Mainland, DO  Multiple Vitamins-Minerals (WOMENS ONE DAILY PO) Take 1 tablet by mouth daily.    [provider]  nitroGLYCERIN (NITROSTAT) 0.4 MG SL tablet Place 1 tablet (0.4 mg total) under the tongue every 5 (five) minutes as needed for chest pain. 07/30/19   Skeet Latch, MD  promethazine-dextromethorphan (PROMETHAZINE-DM) 6.25-15 MG/5ML syrup Take 5 mLs by mouth 4 (four) times daily as  needed. 05/24/20   Ann Held, DO  sodium chloride (OCEAN) 0.65 % SOLN nasal spray Place 1 spray into both nostrils as needed for congestion. 12/13/18   Frederica Kuster, PA-C    Family History    Family History  Problem Relation Age of Onset  . Hypertension Mother   . Cancer Brother   . Diabetes Brother   . Diabetes Father   . Stroke Father   . Neuropathy Father   . Hyperlipidemia Father   . Neuropathy Brother   . Thyroid disease Neg Hx    She indicated that her mother is alive. She indicated that her father is deceased. She indicated that one of her two brothers is deceased. She indicated that her daughter is alive. She indicated that her son is alive. She indicated that the status of her neg hx is unknown.  Social History    Social History   Socioeconomic History  . Marital status: Legally Separated    Spouse  name: Not on file  . Number of children: Not on file  . Years of education: Not on file  . Highest education level: Not on file  Occupational History    Comment: [employer]  Tobacco Use  . Smoking status: Never Smoker  . Smokeless tobacco: Never Used  Substance and Sexual Activity  . Alcohol use: No  . Drug use: No  . Sexual activity: Yes    Birth control/protection: Surgical  Other Topics Concern  . Not on file  Social History Narrative  . Not on file   Social Determinants of Health   Financial Resource Strain:   . Difficulty of Paying Living Expenses: Not on file  Food Insecurity:   . Worried About Charity fundraiser in the Last Year: Not on file  . Ran Out of Food in the Last Year: Not on file  Transportation Needs:   . Lack of Transportation (Medical): Not on file  . Lack of Transportation (Non-Medical): Not on file  Physical Activity:   . Days of Exercise per Week: Not on file  . Minutes of Exercise per Session: Not on file  Stress:   . Feeling of Stress : Not on file  Social Connections:   . Frequency of Communication with  Friends and Family: Not on file  . Frequency of Social Gatherings with Friends and Family: Not on file  . Attends Religious Services: Not on file  . Active Member of Clubs or Organizations: Not on file  . Attends Archivist Meetings: Not on file  . Marital Status: Not on file  Intimate Partner Violence:   . Fear of Current or Ex-Partner: Not on file  . Emotionally Abused: Not on file  . Physically Abused: Not on file  . Sexually Abused: Not on file     Review of Systems    General:  No chills, fever, night sweats or weight changes.  Cardiovascular:  No chest pain, dyspnea on exertion, edema, orthopnea, palpitations, paroxysmal nocturnal dyspnea. Dermatological: No rash, lesions/masses Respiratory: No cough, dyspnea Urologic: No hematuria, dysuria Abdominal:   No nausea, vomiting, diarrhea, bright red blood per rectum, melena, or hematemesis Neurologic:  No visual changes, wkns, changes in mental status. All other systems reviewed and are otherwise negative except as noted above.  Physical Exam    VS:  There were no vitals taken for this visit. , BMI There is no height or weight on file to calculate BMI. GEN: Well nourished, well developed, in no acute distress. HEENT: normal. Neck: Supple, no JVD, carotid bruits, or masses. Cardiac: RRR, no murmurs, rubs, or gallops. No clubbing, cyanosis, edema.  Radials/DP/PT 2+ and equal bilaterally.  Respiratory:  Respirations regular and unlabored, clear to auscultation bilaterally. GI: Soft, nontender, nondistended, BS + x 4. MS: no deformity or atrophy. Skin: warm and dry, no rash. Neuro:  Strength and sensation are intact. Psych: Normal affect.  Accessory Clinical Findings    Recent Labs: 09/11/2019: ALT 12; BUN 12; Creat 0.97; Hemoglobin 12.8; Platelets 443; Potassium 3.6; Sodium 141; TSH 1.27   Recent Lipid Panel    Component Value Date/Time   CHOL 110 09/11/2019 1449   CHOL 120 07/30/2019 1010   TRIG 81 09/11/2019  1449   HDL 34 (L) 09/11/2019 1449   HDL 37 (L) 07/30/2019 1010   CHOLHDL 3.2 09/11/2019 1449   VLDL 15.6 06/17/2018 1015   LDLCALC 60 09/11/2019 1449    ECG personally reviewed by me today- *** - No acute  changes  Echocardiogram 12/19/2015 Study Conclusions   - Left ventricle: The cavity size was normal. Wall thickness was  normal. Systolic function was normal. The estimated ejection  fraction was in the range of 60% to 65%. Mildly abnormal GLPSS at  -17%, more regional inferior strain. Wall motion was normal;  there were no regional wall motion abnormalities. Doppler  parameters are consistent with abnormal left ventricular  relaxation (grade 1 diastolic dysfunction). The E/e&' ratio is  between 8-15, suggesting indeterminate LV filling pressure.  - Left atrium: The atrium was normal in size.  - Tricuspid valve: There was no significant regurgitation.  - Inferior vena cava: The vessel was normal in size. The  respirophasic diameter changes were in the normal range (= 50%),  consistent with normal central venous pressure.   Impressions:   - LVEF 60-65%, normal wall thickness, normal wall motion, mildly  abnormal GLPSS at -17% (inferior), Grade 1 DD with indeterminate  LV filling pressure, normal chamber sizes, normal IVC.   Transthoracic echocardiography. M-mode, complete 2D, spectral  Doppler, and color Doppler. Birthdate: Patient birthdate:  [birth year]. Age: Patient is 54 yr old. Sex: Gender: female.  BMI: 29.8 kg/m^2. Blood pressure:   148/83 Patient status:  Inpatient. Study date: Study date: 12/19/2015. Study time: 02:19  PM. Location: ICU/CCU   -------------------------------------------------------------------   -------------------------------------------------------------------  Left ventricle: The cavity size was normal. Wall thickness was  normal. Systolic function was normal. The estimated ejection  fraction was in the range  of 60% to 65%. Mildly abnormal GLPSS at  -17%, more regional inferior strain. Wall motion was normal; there  were no regional wall motion abnormalities. Doppler parameters are  consistent with abnormal left ventricular relaxation (grade 1  diastolic dysfunction). The E/e&' ratio is between 8-15, suggesting  indeterminate LV filling pressure.   -------------------------------------------------------------------  Aortic valve:  Structurally normal valve. Trileaflet. Cusp  separation was normal. Doppler: Transvalvular velocity was within  the normal range. There was no stenosis. There was no  regurgitation.   Cardiac catheterization 12/18/2015   1st Diag lesion, 100% stenosed.  There is mild to moderate left ventricular systolic dysfunction.   1. Total occlusion of the first diagonal branch of the LAD, with typical angiographic appearance of spontaneous coronary artery dissection/intramural hematoma 2. Normal left main, left circumflex, LAD, and RCA 3. 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 Treat with opioid analgesics and ASA. Post-MI medical therapy with beta-blocker/ACE as tolerated. Avoid heparin, P2Y12 inhibitors. Wrote to start statin but would only continue if hyperlipidemia present. Would keep in hospital 48 hours minimum. Check echo and carotid duplex (eval for FMD)   Assessment & Plan   1.  Spontaneous coronary artery dissection-no chest pain today.  No further episodes of chest discomfort.  Medical therapy with addition of beta-blocker was used to treat her coronary artery dissection (12/18/2015).  CTA 9/19 showed scad of D1 and stable condition. Continue amlodipine, aspirin, losartan-HCTZ, metoprolol, nitroglycerin Heart healthy low-sodium diet-salty 6 given Increase physical activity as tolerated  Essential hypertension-BP today***.  Well-controlled at home. Continue  amlodipine, losartan-HCTZ, metoprolol.   Heart healthy low-sodium diet-salty 6 given Increase physical activity as tolerated  Disposition: Follow-up with Dr. Oval Linsey or myself in 12 months.  Jossie Ng. Khai Torbert NP-C    08/01/2020, 6:19 AM [address] Office [phone] Fax [phone]  Notice: This dictation was prepared with Dragon dictation along with smaller phrase  technology. Any transcriptional errors that result from this process are unintentional and may not be corrected upon review.

## 2020-08-03 NOTE — Progress Notes (Signed)
Cardiology Clinic Note   Patient Name: Melody Gardner Date of Encounter: 08/04/2020  Primary Care Provider:  Carollee Gardner, Melody Apa, DO Primary Cardiologist:  Melody Latch, MD  Patient Profile    Melody Gardner 54 year old female presents the clinic today for follow-up evaluation of her hypertension, and spontaneous coronary artery dissection with Melody Gardner 3/17  Past Medical History    Past Medical History:  Diagnosis Date  . Abnormal uterine bleeding 01/30/2012   Benign endometrial biopsy, showed endometrial polyps   . Acute MI, lateral wall, initial episode of care (Melody Gardner) 12/18/2015  . Anemia 2013  . Carotid artery disease ([place]) 01/30/2019   Mild 1-39%-  by CTA 9/19  . 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 Crohn's disease ([place])   . Crohn's disease of both small and large intestine without complication ([place]) 7/35/3299  . Crohn's disease with complication ([place]) 2/42/6834  . Depression    pt states she is depressed but not medically diagnosed  . Endometrial polyp 01/31/2012   On endometrial biopsy done 01/30/12   . Fibroids 01/30/2012  . Hematochezia 03/05/2016  . Hypertension 2012  . Hypertensive heart disease 12/19/2015  . Plantar fasciitis 01/02/2019  . S/P hydrothermal endometrial ablation on 03/31/2012 03/31/2012   Past Surgical History:  Procedure Laterality Date  . CARDIAC CATHETERIZATION N/A 12/18/2015   Procedure: Left Heart Cath and Coronary Angiography;  Surgeon: Sherren Mocha, MD;  Location: [facility];  Service: Cardiovascular;  Laterality: N/A;  . ENDOMETRIAL ABLATION  2013  . LAPAROSCOPY FOR ECTOPIC PREGNANCY    . RENAL ARTERY STENT    . TUBAL LIGATION  1989    Allergies  Allergies  Allergen Reactions  . Folic Acid Rash    Pt declines this allergy  . Sulfa Antibiotics Rash  . Sulfasalazine Itching and Rash    History of Present Illness    [name] has a PMH of fibromuscular dysplasia, spontaneous  coronary artery dissection of Melody Gardner (3/17), HTN, renal artery stenosis status post stenting, and Crohn's disease.  She was admitted 3/17 with chest pain.  Her cardiac enzymes were elevated with mild lateral ST elevation.  Cardiac catheterization showed a spontaneous dissection with 100% occlusion of Melody Gardner which was medically managed.  Echocardiogram showed an LVEF of 60-70%, G1 DD.  She reported having renal artery stented when she was around 40.  Due to her history she was referred for carotid ultrasound which showed bilateral intimal thickening 1-39% stenosis, no plaque was noted.  She underwent CTA of her whole body 7/17 and was found to have bilateral cervical ICA fibromuscular dysplasia.  Carotid ultrasound showed 1-39% stenosis bilaterally.  She was evaluated by Dr. Doren Gardner 10/17 and it was felt no intervention was necessary.  There was no FND noted of the renal arteries.  A 12 mm soft tissue nodule in her right neck suspicious for atrophic parathyroid adenoma and dental caries was seen.  Repeat CT of her head neck 9/19 showed unchanged images from 2017.  She was last seen by Dr. Oval Gardner on 07/30/2019.  During that time she was feeling well.  She had 1 episode of chest pain that occurred at rest.  She took an aspirin which helped to relieve her symptoms.  She was noted to have had increased gas and was belching frequently.  She felt that this may have contributed to her episode of chest discomfort.  There is no associated shortness of breath, nausea,  or diaphoresis with the episode.  She did note at times she gets hot and does have some diaphoresis.  She denied lower extremity edema, orthopnea, and PND.  She was trying to become more physically active.  She was walking twice per week for 1.5 hours.  She indicated that she was walking at work and did not have any exertional chest discomfort.  She was working 2 jobs.  Her schedule made it more difficult for her to follow a healthy diet.  Her blood pressure at home is  consistently in the 130s over 80s.  She presents the clinic today for follow-up evaluation states she feels well.  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  She states that she enjoys eating high sodium foods.  She states she is planning on increasing her physical activity.  She has noticed that over the last several months she is having more reflux when she lays down at night.  She has been using her sisters and acid medication occasionally which greatly helps with her symptoms.  She does wear lower extremity support stockings when she is at work but states that they are expensive.  She did have an episode of chest pain a day after she had her COVID-19 vaccination.  She stated the episode lasted for 30 minutes was at rest and dissipated with rest.  I will prescribe Protonix, give her the [place] support stocking sheet, however follow the salty 6 guidelines, and have her follow-up in 1 year.  We will request her labs from her PCP.  Today she denies chest pain, shortness of breath, lower extremity edema, fatigue, palpitations, melena, hematuria, hemoptysis, diaphoresis, weakness, presyncope, syncope, orthopnea, and PND.   Home Medications    Prior to Admission medications   Medication Sig Start Date End Date Taking? Authorizing Provider  amLODipine (NORVASC) 10 MG tablet TAKE 1 TABLET BY MOUTH DAILY 06/29/19   Melody Latch, MD  aspirin EC 81 MG tablet Take 1 tablet (81 mg total) by mouth daily. 12/22/15   Arbutus Leas, NP  azaTHIOprine (IMURAN) 50 MG tablet Take 50 mg by mouth 3 (three) times daily.    [provider]  azithromycin (ZITHROMAX Z-PAK) 250 MG tablet As directed 05/24/20   Melody Gardner, Melody Apa, DO  balsalazide (COLAZAL) 750 MG capsule Take 2,250 mg by mouth 3 (three) times daily.    [provider]  fluticasone (FLONASE) 50 MCG/ACT nasal spray Place 1 spray into both nostrils daily. 12/13/18   Law, Bea Graff, PA-C  losartan-hydrochlorothiazide  (HYZAAR) 100-12.5 MG tablet TAKE 1 TABLET BY MOUTH DAILY 06/23/20   Erlene Quan, PA-C  megestrol (MEGACE) 20 MG tablet TAKE 1 TABLET(20 MG) BY MOUTH DAILY 02/24/20   Truett Mainland, DO  metoprolol tartrate (LOPRESSOR) 25 MG tablet Take 1 tablet (25 mg total) by mouth 2 (two) times daily. 10/07/19   Melody Latch, MD  metroNIDAZOLE (FLAGYL) 500 MG tablet Take 1 tablet (500 mg total) by mouth 2 (two) times daily. 05/25/19   Truett Mainland, DO  Multiple Vitamins-Minerals (WOMENS ONE DAILY PO) Take 1 tablet by mouth daily.    [provider]  nitroGLYCERIN (NITROSTAT) 0.4 MG SL tablet Place 1 tablet (0.4 mg total) under the tongue every 5 (five) minutes as needed for chest pain. 07/30/19   Melody Latch, MD  promethazine-dextromethorphan (PROMETHAZINE-DM) 6.25-15 MG/5ML syrup Take 5 mLs by mouth 4 (four) times daily as needed. 05/24/20   Ann Held, DO  sodium chloride (OCEAN) 0.65 % SOLN nasal spray Place 1 spray into both nostrils as needed for congestion. 12/13/18   Frederica Kuster, PA-C    Family History    Family History  Problem Relation Age of Onset  . Hypertension Mother   . Cancer Brother   . Diabetes Brother   . Diabetes Father   . Stroke Father   . Neuropathy Father   . Hyperlipidemia Father   . Neuropathy Brother   . Thyroid disease Neg Hx    She indicated that her mother is alive. She indicated that her father is deceased. She indicated that one of her two brothers is deceased. She indicated that her daughter is alive. She indicated that her son is alive. She indicated that the status of her neg hx is unknown.  Social History    Social History   Socioeconomic History  . Marital status: Legally Separated    Spouse name: Not on file  . Number of children: Not on file  . Years of education: Not on file  . Highest education level: Not on file  Occupational History    Comment: [employer]  Tobacco Use  . Smoking status: Never Smoker  .  Smokeless tobacco: Never Used  Substance and Sexual Activity  . Alcohol use: No  . Drug use: No  . Sexual activity: Yes    Birth control/protection: Surgical  Other Topics Concern  . Not on file  Social History Narrative  . Not on file   Social Determinants of Health   Financial Resource Strain:   . Difficulty of Paying Living Expenses: Not on file  Food Insecurity:   . Worried About Charity fundraiser in the Last Year: Not on file  . Ran Out of Food in the Last Year: Not on file  Transportation Needs:   . Lack of Transportation (Medical): Not on file  . Lack of Transportation (Non-Medical): Not on file  Physical Activity:   . Days of Exercise per Week: Not on file  . Minutes of Exercise per Session: Not on file  Stress:   . Feeling of Stress : Not on file  Social Connections:   . Frequency of Communication with Friends and Family: Not on file  . Frequency of Social Gatherings with Friends and Family: Not on file  . Attends Religious Services: Not on file  . Active Member of Clubs or Organizations: Not on file  . Attends Archivist Meetings: Not on file  . Marital Status: Not on file  Intimate Partner Violence:   . Fear of Current or Ex-Partner: Not on file  . Emotionally Abused: Not on file  . Physically Abused: Not on file  . Sexually Abused: Not on file     Review of Systems    General:  No chills, fever, night sweats or weight changes.  Cardiovascular:  No chest pain, dyspnea on exertion, edema, orthopnea, palpitations, paroxysmal nocturnal dyspnea. Dermatological: No rash, lesions/masses Respiratory: No cough, dyspnea Urologic: No hematuria, dysuria Abdominal:   No nausea, vomiting, diarrhea, bright red blood per rectum, melena, or hematemesis Neurologic:  No visual changes, wkns, changes in mental status. All other systems reviewed and are otherwise negative except as noted above.  Physical Exam    VS:  BP (!) 123/94 (BP Location: Left Arm,  Patient Position: Sitting)   Pulse 92   Ht 5' 7"  (1.702 m)   Wt 187 lb (84.8 kg)   SpO2 96%   BMI 29.29  kg/m  , BMI Body mass index is 29.29 kg/m. GEN: Well nourished, well developed, in no acute distress. HEENT: normal. Neck: Supple, no JVD, carotid bruits, or masses. Cardiac: RRR, no murmurs, rubs, or gallops. No clubbing, cyanosis, edema.  Radials/DP/PT 2+ and equal bilaterally.  Respiratory:  Respirations regular and unlabored, clear to auscultation bilaterally. GI: Soft, nontender, nondistended, BS + x 4. MS: no deformity or atrophy. Skin: warm and dry, no rash. Neuro:  Strength and sensation are intact. Psych: Normal affect.  Accessory Clinical Findings    Recent Labs: 09/11/2019: ALT 12; BUN 12; Creat 0.97; Hemoglobin 12.8; Platelets 443; Potassium 3.6; Sodium 141; TSH 1.27   Recent Lipid Panel    Component Value Date/Time   CHOL 110 09/11/2019 1449   CHOL 120 07/30/2019 1010   TRIG 81 09/11/2019 1449   HDL 34 (L) 09/11/2019 1449   HDL 37 (L) 07/30/2019 1010   CHOLHDL 3.2 09/11/2019 1449   VLDL 15.6 06/17/2018 1015   LDLCALC 60 09/11/2019 1449    ECG personally reviewed by me today-normal sinus rhythm minimal voltage criteria for LVH nonspecific T wave abnormality 92 bpm- No acute changes  Echocardiogram 12/19/2015 Study Conclusions   - Left ventricle: The cavity size was normal. Wall thickness was  normal. Systolic function was normal. The estimated ejection  fraction was in the range of 60% to 65%. Mildly abnormal GLPSS at  -17%, more regional inferior strain. Wall motion was normal;  there were no regional wall motion abnormalities. Doppler  parameters are consistent with abnormal left ventricular  relaxation (grade 1 diastolic dysfunction). The E/e&' ratio is  between 8-15, suggesting indeterminate LV filling pressure.  - Left atrium: The atrium was normal in size.  - Tricuspid valve: There was no significant regurgitation.  - Inferior vena  cava: The vessel was normal in size. The  respirophasic diameter changes were in the normal range (= 50%),  consistent with normal central venous pressure.   Impressions:   - LVEF 60-65%, normal wall thickness, normal wall motion, mildly  abnormal GLPSS at -17% (inferior), Grade 1 DD with indeterminate  LV filling pressure, normal chamber sizes, normal IVC.   Transthoracic echocardiography. M-mode, complete 2D, spectral  Doppler, and color Doppler. Birthdate: Patient birthdate:  01-11-1966. Age: Patient is 54 yr old. Sex: Gender: female.  BMI: 29.8 kg/m^2. Blood pressure:   148/83 Patient status:  Inpatient. Study date: Study date: 12/19/2015. Study time: 02:19  PM. Location: ICU/CCU   -------------------------------------------------------------------   -------------------------------------------------------------------  Left ventricle: The cavity size was normal. Wall thickness was  normal. Systolic function was normal. The estimated ejection  fraction was in the range of 60% to 65%. Mildly abnormal GLPSS at  -17%, more regional inferior strain. Wall motion was normal; there  were no regional wall motion abnormalities. Doppler parameters are  consistent with abnormal left ventricular relaxation (grade 1  diastolic dysfunction). The E/e&' ratio is between 8-15, suggesting  indeterminate LV filling pressure.   -------------------------------------------------------------------  Aortic valve:  Structurally normal valve. Trileaflet. Cusp  separation was normal. Doppler: Transvalvular velocity was within  the normal range. There was no stenosis. There was no  regurgitation.   Cardiac catheterization 12/18/2015   1st Diag lesion, 100% stenosed.  There is mild to moderate left ventricular systolic dysfunction.   1. Total occlusion of the first diagonal branch of the LAD, with typical angiographic appearance of spontaneous coronary artery  dissection/intramural hematoma 2. Normal left main, left circumflex, LAD, and RCA 3. Mild segmental LV systolic  dysfunction consistent with diagonal infarct  Pt likely with SCAD (spontaneous coronary artery dissection). Treat with opioid analgesics and ASA. Post-MI medical therapy with beta-blocker/ACE as tolerated. Avoid heparin, P2Y12 inhibitors. Wrote to start statin but would only continue if hyperlipidemia present. Would keep in hospital 48 hours minimum. Check echo and carotid duplex (eval for FMD)  Assessment & Plan   1.  Spontaneous coronary artery dissection-no chest pain today.  No further episodes of chest discomfort.  Medical therapy with addition of beta-blocker was used to treat her coronary artery dissection (12/18/2015).  CTA 9/19 showed scad of Melody Gardner and stable condition. Continue amlodipine, aspirin, losartan-HCTZ, metoprolol, nitroglycerin Heart healthy low-sodium diet-salty 6 given Increase physical activity as tolerated  Essential hypertension-BP today 123/94.  Well-controlled at home. Continue amlodipine, losartan-HCTZ, metoprolol.   Heart healthy low-sodium diet-salty 6 given Increase physical activity as tolerated  Disposition: Follow-up with Dr. Oval Gardner or myself in 12 months.   Jossie Ng. Jhayden Demuro NP-C    08/04/2020, 2:57 PM [facility] [address] Office [phone] Fax [phone]  Notice: This dictation was prepared with Dragon dictation along with smaller phrase technology. Any transcriptional errors that result from this process are unintentional and may not be corrected upon review.

## 2020-08-04 ENCOUNTER — Ambulatory Visit (INDEPENDENT_AMBULATORY_CARE_PROVIDER_SITE_OTHER): Payer: Self-pay | Admitting: General Practice

## 2020-08-04 ENCOUNTER — Encounter: Payer: Self-pay | Admitting: General Practice

## 2020-08-04 ENCOUNTER — Other Ambulatory Visit: Payer: Self-pay

## 2020-08-04 VITALS — BP 123/94 | HR 92 | Ht 67.0 in | Wt 187.0 lb

## 2020-08-04 DIAGNOSIS — I10 Essential (primary) hypertension: Secondary | ICD-10-CM

## 2020-08-04 DIAGNOSIS — N939 Abnormal uterine and vaginal bleeding, unspecified: Secondary | ICD-10-CM

## 2020-08-04 DIAGNOSIS — I2542 Coronary artery dissection: Secondary | ICD-10-CM

## 2020-08-04 MED ORDER — MEGESTROL ACETATE 20 MG PO TABS: ORAL_TABLET | ORAL | 3 refills | Status: DC

## 2020-08-04 MED ORDER — PANTOPRAZOLE SODIUM 20 MG PO TBEC: 20.0000 mg | DELAYED_RELEASE_TABLET | Freq: Every day | ORAL | 3 refills | Status: DC

## 2020-08-04 NOTE — Patient Instructions (Signed)
Medication Instructions:  START PROTONIX 20MG DAILY  *If you need a refill on your cardiac medications before your next appointment, please call your pharmacy*  Lab Work:   Testing/Procedures:  NONE    NONE  Special Instructions PLEASE READ AND FOLLOW SALTY 6-ATTACHED-1,860m daily  PLEASE INCREASE PHYSICAL ACTIVITY AS TOLERATED-START AN EXERCISE REGIMEN GOAL IS 150 MINUTES/WEEK.  PLEASE PURCHASE AND WEAR COMPRESSION STOCKINGS DAILY AND OFF AT BEDTIME. Compression stockings are elastic socks that squeeze the legs. They help to increase blood flow to the legs and to decrease swelling in the legs from fluid retention, and reduce the chance of developing blood clots in the lower legs. Please put on in the AM when dressing and off at night when dressing for bed.   PLEASE MAKE SURE TO ELEVATE YOUR FEET & LEGS WHILE SITTING, THIS WILL HELP WITH THE SWELLING ALSO.    [employer]  [phone]; [place] [id number] [phone] EMAIL   [email or web address]   Follow-Up: Your next appointment:  12 month(s) In Person with You may see TSkeet Latch MD, OR IF UNAVAILABLE JESSE CLEAVER, FNP-C or one of the following Advanced Practice Providers on your designated Care Team:  LKerin Ransom [place] CSande Rives [place] Please call our office 2 months in advance to schedule this appointment   [facility] you and your health needs are our priority.  As part of our continuing mission to provide you with exceptional heart care, we have created designated Provider Care Teams.  These Care Teams include your primary Cardiologist (physician) and Advanced Practice Providers (APPs -  Physician Assistants and Nurse Practitioners) who all work together to provide you with the care you need, when you need it.  We recommend signing up for the patient portal called "MyChart".  Sign up information is provided on this After Visit Summary.  MyChart is used to connect with  patients for Virtual Visits (Telemedicine).  Patients are able to view lab/test results, encounter notes, upcoming appointments, etc.  Non-urgent messages can be sent to your provider as well.   To learn more about what you can do with MyChart, go to [email or web address]              6 SALTY THINGS TO AVOID     1,800MG DAILY

## 2020-08-11 ENCOUNTER — Ambulatory Visit: Payer: PRIVATE HEALTH INSURANCE | Admitting: Family Medicine

## 2020-08-17 ENCOUNTER — Encounter: Payer: Self-pay | Admitting: Family Medicine

## 2020-08-17 ENCOUNTER — Telehealth (INDEPENDENT_AMBULATORY_CARE_PROVIDER_SITE_OTHER): Payer: Self-pay | Admitting: Family Medicine

## 2020-08-17 ENCOUNTER — Other Ambulatory Visit: Payer: Self-pay

## 2020-08-17 VITALS — BP 137/97 | HR 93 | Temp 98.4°F | Ht 67.0 in

## 2020-08-17 DIAGNOSIS — J0140 Acute pansinusitis, unspecified: Secondary | ICD-10-CM

## 2020-08-17 MED ORDER — FLUTICASONE PROPIONATE 50 MCG/ACT NA SUSP: 1.0000 | Freq: Every day | NASAL | 0 refills | Status: DC

## 2020-08-17 MED ORDER — AMOXICILLIN-POT CLAVULANATE 875-125 MG PO TABS: 1.0000 | ORAL_TABLET | Freq: Two times a day (BID) | ORAL | 0 refills | Status: DC

## 2020-08-17 NOTE — Progress Notes (Signed)
Virtual Visit via Video Note  I connected with [name] on 08/17/20 at  8:40 AM EST by a video enabled telemedicine application and verified that I am speaking with the correct person using two identifiers.pt had trouble with the video visit and her phone.  We had to transition to a telephone visit   Location/ persons in visit  Patient: home alone  Provider: home    I discussed the limitations of evaluation and management by telemedicine and the availability of in person appointments. The patient expressed understanding and agreed to proceed.  History of Present Illness: Pt is home c/o ear draining x 1 week -- she is using sweet oil.   No fever , no congestion or cough   + sinus pressure under eyes and nose Pt had a covid test last week and it was neg      Observations/Objective: Vitals:   08/17/20 0838  BP: (!) 137/97  Pulse: 93  Temp: 98.4 F (36.9 C)     Assessment and Plan: 1. Acute non-recurrent pansinusitis abx and flonase per orders covid neg If symptoms persist -- can come into office as long as no new symptoms  - fluticasone (FLONASE) 50 MCG/ACT nasal spray; Place 1 spray into both nostrils daily.  Dispense: 5 g; Refill: 0 - amoxicillin-clavulanate (AUGMENTIN) 875-125 MG tablet; Take 1 tablet by mouth 2 (two) times daily.  Dispense: 20 tablet; Refill: 0   Follow Up Instructions:    I discussed the assessment and treatment plan with the patient. The patient was provided an opportunity to ask questions and all were answered. The patient agreed with the plan and demonstrated an understanding of the instructions.   The patient was advised to call back or seek an in-person evaluation if the symptoms worsen or if the condition fails to improve as anticipated.  I provided 2 minutes of non-face-to-face time during this encounter.   Ann Held, DO

## 2020-08-17 NOTE — Progress Notes (Deleted)
Virtual Visit via Video Note  I connected with Melody Gardner on 08/17/20 at  8:40 AM EST by a video enabled telemedicine application and verified that I am speaking with the correct person using two identifiers.  Location: Patient: home Provider: ***   I discussed the limitations of evaluation and management by telemedicine and the availability of in person appointments. The patient expressed understanding and agreed to proceed.  History of Present Illness:    Observations/Objective:   Assessment and Plan:   Follow Up Instructions:    I discussed the assessment and treatment plan with the patient. The patient was provided an opportunity to ask questions and all were answered. The patient agreed with the plan and demonstrated an understanding of the instructions.   The patient was advised to call back or seek an in-person evaluation if the symptoms worsen or if the condition fails to improve as anticipated.  I provided *** minutes of non-face-to-face time during this encounter.   Ann Held, DO

## 2020-09-01 ENCOUNTER — Encounter: Payer: Self-pay | Admitting: Family Medicine

## 2020-09-01 ENCOUNTER — Other Ambulatory Visit: Payer: Self-pay

## 2020-09-01 ENCOUNTER — Ambulatory Visit (INDEPENDENT_AMBULATORY_CARE_PROVIDER_SITE_OTHER): Payer: Self-pay | Admitting: Family Medicine

## 2020-09-01 VITALS — BP 136/86 | HR 87 | Ht 67.0 in | Wt 186.0 lb

## 2020-09-01 DIAGNOSIS — N840 Polyp of corpus uteri: Secondary | ICD-10-CM

## 2020-09-01 DIAGNOSIS — D219 Benign neoplasm of connective and other soft tissue, unspecified: Secondary | ICD-10-CM

## 2020-09-01 DIAGNOSIS — N939 Abnormal uterine and vaginal bleeding, unspecified: Secondary | ICD-10-CM

## 2020-09-01 MED ORDER — MEGESTROL ACETATE 20 MG PO TABS: 20.0000 mg | ORAL_TABLET | Freq: Two times a day (BID) | ORAL | 3 refills | Status: DC

## 2020-09-01 NOTE — Progress Notes (Signed)
   Subjective:    Patient ID: Melody Gardner, female    DOB: Feb 28, 1966, 54 y.o.   MRN: [id number]  HPI Seen for follow up of AUB-menorrhagia due to fibroids. She has been taking megace 61m daily. Menses has improved in heaviness, but is now 7 days in length. Not having heavy flow. Is curious about hysterectomy.   Review of Systems     Objective:   Physical Exam Vitals reviewed.  Constitutional:      Appearance: Normal appearance.  Cardiovascular:     Rate and Rhythm: Normal rate.     Pulses: Normal pulses.  Pulmonary:     Effort: Pulmonary effort is normal.  Abdominal:     General: Abdomen is flat. There is no distension.     Palpations: There is no mass.     Tenderness: There is no abdominal tenderness. There is no guarding or rebound.     Hernia: No hernia is present.  Skin:    Capillary Refill: Capillary refill takes less than 2 seconds.  Neurological:     Mental Status: She is alert.  Psychiatric:        Mood and Affect: Mood normal.        Behavior: Behavior normal.        Thought Content: Thought content normal.        Judgment: Judgment normal.       Assessment & Plan:  1. Abnormal uterine bleeding Discussed hysterectomy. She should be close to menopause (upper end of age range). Hysterectomy is possible, but may choose to wait. Will increase megace dose to see if this is helpful. F/u in 3 months.  2. Endometrial polyp  3. Fibroids

## 2020-09-12 ENCOUNTER — Encounter: Payer: PRIVATE HEALTH INSURANCE | Admitting: Family Medicine

## 2020-10-21 ENCOUNTER — Other Ambulatory Visit: Payer: Self-pay

## 2020-10-21 MED ORDER — AMLODIPINE BESYLATE 10 MG PO TABS
10.0000 mg | ORAL_TABLET | Freq: Every day | ORAL | 3 refills | Status: DC
Start: 2020-10-21 — End: 2021-11-08

## 2020-10-21 NOTE — Telephone Encounter (Signed)
This is Dr. Blenda Mounts pt

## 2020-10-28 ENCOUNTER — Other Ambulatory Visit: Payer: Self-pay | Admitting: Cardiovascular Disease

## 2020-10-29 ENCOUNTER — Other Ambulatory Visit: Payer: Self-pay | Admitting: Cardiology

## 2020-10-31 ENCOUNTER — Other Ambulatory Visit: Payer: Self-pay | Admitting: Cardiology

## 2020-11-10 ENCOUNTER — Telehealth: Payer: Self-pay | Admitting: General Practice

## 2020-11-10 NOTE — Telephone Encounter (Signed)
Left message on VM informing patient to call office to schedule 3 month follow up with Dr. Nehemiah Settle.

## 2020-12-02 ENCOUNTER — Other Ambulatory Visit: Payer: Self-pay | Admitting: Cardiology

## 2020-12-15 ENCOUNTER — Other Ambulatory Visit: Payer: Self-pay

## 2020-12-16 ENCOUNTER — Encounter: Payer: Self-pay | Admitting: Family Medicine

## 2021-02-14 ENCOUNTER — Telehealth (INDEPENDENT_AMBULATORY_CARE_PROVIDER_SITE_OTHER): Payer: Self-pay | Admitting: Family

## 2021-02-14 ENCOUNTER — Other Ambulatory Visit: Payer: Self-pay

## 2021-02-14 ENCOUNTER — Encounter: Payer: Self-pay | Admitting: Family

## 2021-02-14 VITALS — BP 122/84 | HR 98

## 2021-02-14 DIAGNOSIS — U071 COVID-19: Secondary | ICD-10-CM | POA: Insufficient documentation

## 2021-02-14 NOTE — Progress Notes (Signed)
Virtual telephone visit    Virtual Visit via Telephone Note   This visit type was conducted due to national recommendations for restrictions regarding the COVID-19 Pandemic (e.g. social distancing) in an effort to limit this patient's exposure and mitigate transmission in our community. Due to her co-morbid illnesses, this patient is at least at moderate risk for complications without adequate follow up. This format is felt to be most appropriate for this patient at this time. The patient did not have access to video technology or had technical difficulties with video requiring transitioning to audio format only (telephone). Physical exam was limited to content and character of the telephone converstion. CMA was able to get the patient set up on a telephone visit.   Patient location: home. Patient and provider in visit Provider location: Office  I discussed the limitations of evaluation and management by telemedicine and the availability of in person appointments. The patient expressed understanding and agreed to proceed.   Visit Date: 02/14/2021  Today's healthcare provider: Nance Pear, NP     Subjective:    Patient ID: Melody Gardner, female    DOB: [birth year], 55 y.o.   MRN: [id number]  Chief Complaint  Patient presents with  . Covid Positive    Patient tested positive for covid at work on 02-11-21  . Sore Throat    Patient complains of sore throat    HPI Patient is 55 yr old female who presents today to discuss covid-19 infection.  She initially developed symptoms Tuesday 5/10. Initially she just thought she was having drainage from allergies. She then developed sore throat and cough and she tested + for Covid-19 on the 14th.  She has had 2 covid vaccines but no booster.   She denies SOB, fever, headache, diarrhea, nausea, vomiting. She denies fatigue.  Current symptoms are sore throat (improving), mild cough and runny nose.   Past Medical History:  Diagnosis Date   . Abnormal uterine bleeding 01/30/2012   Benign endometrial biopsy, showed endometrial polyps   . Acute MI, lateral wall, initial episode of care ([place]) 12/18/2015  . Anemia 2013  . Carotid artery disease ([place]) 01/30/2019   Mild 1-39%-  by CTA 9/19  . 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 Crohn's disease ([place])   . Crohn's disease of both small and large intestine without complication ([place]) 3/79/0240  . Crohn's disease with complication ([place]) 9/73/5329  . Depression    pt states she is depressed but not medically diagnosed  . Endometrial polyp 01/31/2012   On endometrial biopsy done 01/30/12   . Fibroids 01/30/2012  . Hematochezia 03/05/2016  . Hypertension 2012  . Hypertensive heart disease 12/19/2015  . Plantar fasciitis 01/02/2019  . S/P hydrothermal endometrial ablation on 03/31/2012 03/31/2012    Past Surgical History:  Procedure Laterality Date  . CARDIAC CATHETERIZATION N/A 12/18/2015   Procedure: Left Heart Cath and Coronary Angiography;  Surgeon: Sherren Mocha, MD;  Location: [facility];  Service: Cardiovascular;  Laterality: N/A;  . ENDOMETRIAL ABLATION  2013  . LAPAROSCOPY FOR ECTOPIC PREGNANCY    . RENAL ARTERY STENT    . TUBAL LIGATION  1989    Family History  Problem Relation Age of Onset  . Hypertension Mother   . Cancer Brother   . Diabetes Brother   . Diabetes Father   . Stroke Father   . Neuropathy Father   . Hyperlipidemia Father   .  Neuropathy Brother   . Thyroid disease Neg Hx     Social History   Socioeconomic History  . Marital status: Legally Separated    Spouse name: Not on file  . Number of children: Not on file  . Years of education: Not on file  . Highest education level: Not on file  Occupational History    Comment: [employer]  Tobacco Use  . Smoking status: Never Smoker  . Smokeless tobacco: Never Used  Substance and Sexual Activity  . Alcohol use: No  . Drug use: No  .  Sexual activity: Yes    Birth control/protection: Surgical  Other Topics Concern  . Not on file  Social History Narrative  . Not on file   Social Determinants of Health   Financial Resource Strain: Not on file  Food Insecurity: Not on file  Transportation Needs: Not on file  Physical Activity: Not on file  Stress: Not on file  Social Connections: Not on file  Intimate Partner Violence: Not on file    Outpatient Medications Prior to Visit  Medication Sig Dispense Refill  . amLODipine (NORVASC) 10 MG tablet Take 1 tablet (10 mg total) by mouth daily. 180 tablet 3  . aspirin EC 81 MG tablet Take 1 tablet (81 mg total) by mouth daily. 30 tablet 12  . azaTHIOprine (IMURAN) 50 MG tablet Take 50 mg by mouth 3 (three) times daily.    . balsalazide (COLAZAL) 750 MG capsule Take 2,250 mg by mouth 3 (three) times daily.    . fluticasone (FLONASE) 50 MCG/ACT nasal spray Place 1 spray into both nostrils daily. 5 g 0  . losartan-hydrochlorothiazide (HYZAAR) 100-12.5 MG tablet TAKE 1 TABLET BY MOUTH DAILY 30 tablet 09  . megestrol (MEGACE) 20 MG tablet Take 1 tablet (20 mg total) by mouth 2 (two) times daily. 60 tablet 3  . metoprolol tartrate (LOPRESSOR) 25 MG tablet TAKE 1 TABLET(25 MG) BY MOUTH TWICE DAILY 180 tablet 3  . Multiple Vitamins-Minerals (WOMENS ONE DAILY PO) Take 1 tablet by mouth daily.    . nitroGLYCERIN (NITROSTAT) 0.4 MG SL tablet Place 1 tablet (0.4 mg total) under the tongue every 5 (five) minutes as needed for chest pain. 25 tablet PRN  . sodium chloride (OCEAN) 0.65 % SOLN nasal spray Place 1 spray into both nostrils as needed for congestion. 15 mL 0  . amoxicillin-clavulanate (AUGMENTIN) 875-125 MG tablet Take 1 tablet by mouth 2 (two) times daily. 20 tablet 0  . pantoprazole (PROTONIX) 20 MG tablet Take 1 tablet (20 mg total) by mouth daily. 30 tablet 3   No facility-administered medications prior to visit.    Allergies  Allergen Reactions  . Folic Acid Rash    Pt  declines this allergy  . Sulfa Antibiotics Rash  . Sulfasalazine Itching and Rash    ROS    see HPI Objective:    Physical Exam Constitutional:      Appearance: She is well-developed.  Pulmonary:     Effort: Pulmonary effort is normal.     Breath sounds: Normal breath sounds.  Neurological:     Mental Status: She is alert and oriented to person, place, and time.  Psychiatric:        Behavior: Behavior normal.        Thought Content: Thought content normal.        Judgment: Judgment normal.     BP 122/84   Pulse 98  Wt Readings from Last 3 Encounters:  09/01/20 186  lb (84.4 kg)  08/04/20 187 lb (84.8 kg)  09/11/19 173 lb 6.4 oz (78.7 kg)    Diabetic Foot Exam - Simple   No data filed    Lab Results  Component Value Date   WBC 6.2 09/11/2019   HGB 12.8 09/11/2019   HCT 38.8 09/11/2019   PLT 443 (H) 09/11/2019   GLUCOSE 77 09/11/2019   CHOL 110 09/11/2019   TRIG 81 09/11/2019   HDL 34 (L) 09/11/2019   LDLCALC 60 09/11/2019   ALT 12 09/11/2019   AST 19 09/11/2019   NA 141 09/11/2019   K 3.6 09/11/2019   CL 105 09/11/2019   CREATININE 0.97 09/11/2019   BUN 12 09/11/2019   CO2 24 09/11/2019   TSH 1.27 09/11/2019    Lab Results  Component Value Date   TSH 1.27 09/11/2019   Lab Results  Component Value Date   WBC 6.2 09/11/2019   HGB 12.8 09/11/2019   HCT 38.8 09/11/2019   MCV 90.7 09/11/2019   PLT 443 (H) 09/11/2019   Lab Results  Component Value Date   NA 141 09/11/2019   K 3.6 09/11/2019   CO2 24 09/11/2019   GLUCOSE 77 09/11/2019   BUN 12 09/11/2019   CREATININE 0.97 09/11/2019   BILITOT 0.9 09/11/2019   ALKPHOS 60 07/30/2019   AST 19 09/11/2019   ALT 12 09/11/2019   PROT 7.2 09/11/2019   ALBUMIN 4.0 07/30/2019   CALCIUM 9.7 09/11/2019   ANIONGAP 8 10/14/2017   GFR 102.61 06/17/2018   Lab Results  Component Value Date   CHOL 110 09/11/2019   Lab Results  Component Value Date   HDL 34 (L) 09/11/2019   Lab Results  Component  Value Date   LDLCALC 60 09/11/2019   Lab Results  Component Value Date   TRIG 81 09/11/2019   Lab Results  Component Value Date   CHOLHDL 3.2 09/11/2019   No results found for: HGBA1C     Assessment & Plan:   Problem List Items Addressed This Visit   None     I have discontinued Melody Gardner's amoxicillin-clavulanate. I am also having her maintain her balsalazide, aspirin EC, Multiple Vitamins-Minerals (WOMENS ONE DAILY PO), azaTHIOprine, sodium chloride, nitroGLYCERIN, pantoprazole, fluticasone, megestrol, amLODipine, metoprolol tartrate, and losartan-hydrochlorothiazide.  No orders of the defined types were placed in this encounter.    I discussed the assessment and treatment plan with the patient. The patient was provided an opportunity to ask questions and all were answered. The patient agreed with the plan and demonstrated an understanding of the instructions.   The patient was advised to call back or seek an in-person evaluation if the symptoms worsen or if the condition fails to improve as anticipated.  I provided 12 minutes of non-face-to-face time during this encounter.   Nance Pear, NP [employer] [facility] [employer] [facility] [phone] (phone) [phone] (fax)  [place]

## 2021-02-14 NOTE — Assessment & Plan Note (Signed)
Patient is on day 8 of symptoms.  Unfortunately she is out of the 5 day window for treatment with paxlovid/molnupiravir and outside of the 7 day window for monoclonal antibody infusion. Fortunately, she is improving. Advised of CDC guidelines for self isolation/ ending isolation.  Advised of safe practice guidelines. Symptom Tier reviewed.  Encouraged to monitor for any worsening symptoms; watch for increased shortness of breath, weakness, and signs of dehydration. Advised when to seek emergency care.  Instructed to rest and hydrate well.  Advised to leave the house during recommended isolation period, only if it is necessary to seek medical care

## 2021-05-16 ENCOUNTER — Emergency Department (HOSPITAL_BASED_OUTPATIENT_CLINIC_OR_DEPARTMENT_OTHER): Payer: Self-pay

## 2021-05-16 ENCOUNTER — Other Ambulatory Visit: Payer: Self-pay

## 2021-05-16 ENCOUNTER — Emergency Department (HOSPITAL_BASED_OUTPATIENT_CLINIC_OR_DEPARTMENT_OTHER)
Admission: EM | Admit: 2021-05-16 | Discharge: 2021-05-16 | Disposition: A | Discharge status: Home / Self Care | Payer: Self-pay | Attending: Emergency Medicine | Admitting: Emergency Medicine

## 2021-05-16 ENCOUNTER — Encounter (HOSPITAL_BASED_OUTPATIENT_CLINIC_OR_DEPARTMENT_OTHER): Payer: Self-pay

## 2021-05-16 DIAGNOSIS — Z7982 Long term (current) use of aspirin: Secondary | ICD-10-CM | POA: Insufficient documentation

## 2021-05-16 DIAGNOSIS — R0981 Nasal congestion: Secondary | ICD-10-CM | POA: Insufficient documentation

## 2021-05-16 DIAGNOSIS — I10 Essential (primary) hypertension: Secondary | ICD-10-CM | POA: Insufficient documentation

## 2021-05-16 DIAGNOSIS — M542 Cervicalgia: Secondary | ICD-10-CM | POA: Insufficient documentation

## 2021-05-16 DIAGNOSIS — I2510 Atherosclerotic heart disease of native coronary artery without angina pectoris: Secondary | ICD-10-CM | POA: Insufficient documentation

## 2021-05-16 DIAGNOSIS — Z79899 Other long term (current) drug therapy: Secondary | ICD-10-CM | POA: Insufficient documentation

## 2021-05-16 DIAGNOSIS — R519 Headache, unspecified: Secondary | ICD-10-CM | POA: Insufficient documentation

## 2021-05-16 DIAGNOSIS — Z8616 Personal history of COVID-19: Secondary | ICD-10-CM | POA: Insufficient documentation

## 2021-05-16 LAB — CBC
HCT: 45.1 % (ref 36.0–46.0)
Hemoglobin: 15 g/dL (ref 12.0–15.0)
MCH: 29.9 pg (ref 26.0–34.0)
MCHC: 33.3 g/dL (ref 30.0–36.0)
MCV: 90 fL (ref 80.0–100.0)
Platelets: 363 10*3/uL (ref 150–400)
RBC: 5.01 MIL/uL (ref 3.87–5.11)
RDW: 13.9 % (ref 11.5–15.5)
WBC: 7.2 10*3/uL (ref 4.0–10.5)
nRBC: 0 % (ref 0.0–0.2)

## 2021-05-16 LAB — BASIC METABOLIC PANEL
Anion gap: 7 (ref 5–15)
BUN: 13 mg/dL (ref 6–20)
CO2: 28 mmol/L (ref 22–32)
Calcium: 9.8 mg/dL (ref 8.9–10.3)
Chloride: 102 mmol/L (ref 98–111)
Creatinine, Ser: 0.98 mg/dL (ref 0.44–1.00)
GFR, Estimated: >60 mL/min (ref >60)
Glucose, Bld: 98 mg/dL (ref 70–99)
Potassium: 3.4 mmol/L — ABNORMAL LOW (ref 3.5–5.1)
Sodium: 137 mmol/L (ref 135–145)

## 2021-05-16 MED ORDER — PROCHLORPERAZINE EDISYLATE 10 MG/2ML IJ SOLN
5.0000 mg | Freq: Once | INTRAMUSCULAR | Status: AC
  Administered 2021-05-16: 5 mg via INTRAVENOUS
  Filled 2021-05-16: qty 2

## 2021-05-16 NOTE — ED Triage Notes (Signed)
Pt c/o Ha x 2 days-blurred vision started yesterday-dizziness x today-adds c/o URI sx x 1 week with hx of sinus infection-NAD-to triage in w/c

## 2021-05-16 NOTE — ED Provider Notes (Signed)
[facility] EMERGENCY DEPARTMENT Provider Note   CSN: [id number] Arrival date & time: 05/16/21  1209     History Chief Complaint  Patient presents with   Headache    Melody Gardner is a 55 y.o. female.   Headache Associated symptoms: neck pain   Associated symptoms: no abdominal pain, no congestion and no fever   Patient presents with headache for around 2 days.  Started on Sunday.  In the front of her head.  States she was feeling dizzy today.  States some mildly blurred vision.  Headache is dull.  It has been typical the pain she has had with a sinus infection in the past.  States she has some nasal congestion.  States she has had an occasional cough for the last week.  No fevers or chills.  States there is also some tightness in the back of her head that goes down her neck.  No trauma.  No numbness or weakness.  No confusion.    Past Medical History:  Diagnosis Date   Abnormal uterine bleeding 01/30/2012   Benign endometrial biopsy, showed endometrial polyps    Acute MI, lateral wall, initial episode of care ([place]) 12/18/2015   Anemia 2013   Carotid artery disease ([place]) 01/30/2019   Mild 1-39%-  by CTA 9/19   Coronary artery dissection 12/19/2015   SCAD March 2017- normal LVF then 60-65%, no other CAD   Crohn's disease ([place])    Crohn's disease of both small and large intestine without complication ([place]) 0/73/7106   Crohn's disease with complication ([place]) 2/69/4854   Depression    pt states she is depressed but not medically diagnosed   Endometrial polyp 01/31/2012   On endometrial biopsy done 01/30/12    Fibroids 01/30/2012   Hematochezia 03/05/2016   Hypertension 2012   Hypertensive heart disease 12/19/2015   Plantar fasciitis 01/02/2019   S/P hydrothermal endometrial ablation on 03/31/2012 03/31/2012    Patient Active Problem List   Diagnosis Date Noted   COVID-19 virus infection 02/14/2021   Renal artery stenosis ([place]) 01/30/2019   Carotid artery disease ([place]) 01/30/2019    Plantar fasciitis 01/02/2019   Crohn's disease with complication ([place]) 62/70/3500   Abnormal findings on imaging test 04/01/2018   Neck nodule 05/17/2016   Abnormal liver function test 05/16/2016   Heartburn 03/05/2016   Hematochezia 03/05/2016   Hypertensive heart disease 12/19/2015   Coronary artery dissection 12/19/2015   Acute MI, lateral wall, initial episode of care ([place]) 12/18/2015   Crohn's disease of both small and large intestine without complication ([place]) 93/81/8299   HTN (hypertension) 01/07/2015   S/P hydrothermal endometrial ablation on 03/31/2012 03/31/2012   Endometrial polyp 01/31/2012   Fibroids 01/30/2012   Abnormal uterine bleeding 01/30/2012    Past Surgical History:  Procedure Laterality Date   CARDIAC CATHETERIZATION N/A 12/18/2015   Procedure: Left Heart Cath and Coronary Angiography;  Surgeon: Sherren Mocha, MD;  Location: [facility];  Service: Cardiovascular;  Laterality: N/A;   ENDOMETRIAL ABLATION  2013   LAPAROSCOPY FOR ECTOPIC PREGNANCY     RENAL ARTERY STENT     TUBAL LIGATION  1989     OB History     Gravida  3   Para  2   Term  2   Preterm      AB  1   Living  2      SAB      IAB      Ectopic  1  Multiple      Live Births  2        Obstetric Comments  01/20/86 pregnancy was twin pregnancy, one fetus did not develop.         Family History  Problem Relation Age of Onset   Hypertension Mother    Cancer Brother    Diabetes Brother    Diabetes Father    Stroke Father    Neuropathy Father    Hyperlipidemia Father    Neuropathy Brother    Thyroid disease Neg Hx     Social History   Tobacco Use   Smoking status: Never   Smokeless tobacco: Never  Substance Use Topics   Alcohol use: No   Drug use: No    Home Medications Prior to Admission medications   Medication Sig Start Date End Date Taking? Authorizing Provider  amLODipine (NORVASC) 10 MG tablet Take 1 tablet (10 mg total) by mouth daily.  10/21/20   Skeet Latch, MD  aspirin EC 81 MG tablet Take 1 tablet (81 mg total) by mouth daily. 12/22/15   Arbutus Leas, NP  azaTHIOprine (IMURAN) 50 MG tablet Take 50 mg by mouth 3 (three) times daily.    [provider]  balsalazide (COLAZAL) 750 MG capsule Take 2,250 mg by mouth 3 (three) times daily.    [provider]  fluticasone (FLONASE) 50 MCG/ACT nasal spray Place 1 spray into both nostrils daily. 08/17/20   Ann Held, DO  losartan-hydrochlorothiazide (HYZAAR) 100-12.5 MG tablet TAKE 1 TABLET BY MOUTH DAILY 12/02/20   Skeet Latch, MD  megestrol (MEGACE) 20 MG tablet Take 1 tablet (20 mg total) by mouth 2 (two) times daily. 09/01/20   Truett Mainland, DO  metoprolol tartrate (LOPRESSOR) 25 MG tablet TAKE 1 TABLET(25 MG) BY MOUTH TWICE DAILY 10/28/20   Skeet Latch, MD  Multiple Vitamins-Minerals (WOMENS ONE DAILY PO) Take 1 tablet by mouth daily.    [provider]  nitroGLYCERIN (NITROSTAT) 0.4 MG SL tablet Place 1 tablet (0.4 mg total) under the tongue every 5 (five) minutes as needed for chest pain. 07/30/19   Skeet Latch, MD  pantoprazole (PROTONIX) 20 MG tablet Take 1 tablet (20 mg total) by mouth daily. 08/04/20 09/03/20  Deberah Pelton, NP  sodium chloride (OCEAN) 0.65 % SOLN nasal spray Place 1 spray into both nostrils as needed for congestion. 12/13/18   Law, Bea Graff, PA-C    Allergies    Folic acid, Sulfa antibiotics, and Sulfasalazine  Review of Systems   Review of Systems  Constitutional:  Negative for appetite change and fever.  HENT:  Negative for congestion.   Eyes:  Positive for visual disturbance.  Cardiovascular:  Negative for chest pain.  Gastrointestinal:  Negative for abdominal pain.  Genitourinary:  Negative for flank pain.  Musculoskeletal:  Positive for neck pain.  Skin:  Negative for rash.  Neurological:  Positive for light-headedness and headaches.  Psychiatric/Behavioral:  Negative for  confusion.    Physical Exam Updated Vital Signs BP 121/85   Pulse 74   Temp 98.6 F (37 C) (Oral)   Resp 15   Ht 5' 7"  (1.702 m)   Wt 78.9 kg   SpO2 97%   BMI 27.25 kg/m   Physical Exam Vitals and nursing note reviewed.  HENT:     Head: Atraumatic.  Eyes:     Extraocular Movements: Extraocular movements intact.     Comments: Patient states some mild pain with gaze to left and  right.  Neck:     Comments: No meningismus Cardiovascular:     Rate and Rhythm: Regular rhythm.  Pulmonary:     Breath sounds: Normal breath sounds.  Abdominal:     Tenderness: There is no abdominal tenderness. There is no guarding.  Musculoskeletal:     Cervical back: Neck supple.  Skin:    General: Skin is warm.     Capillary Refill: Capillary refill takes less than 2 seconds.  Neurological:     Mental Status: She is alert.  Psychiatric:        Behavior: Behavior normal.    ED Results / Procedures / Treatments   Labs (all labs ordered are listed, but only abnormal results are displayed) Labs Reviewed  BASIC METABOLIC PANEL - Abnormal; Notable for the following components:      Result Value   Potassium 3.4 (*)    All other components within normal limits  CBC    EKG None  Radiology CT HEAD WO CONTRAST (5MM)  Result Date: 05/16/2021 CLINICAL DATA:  Headache, blurry vision. EXAM: CT HEAD WITHOUT CONTRAST TECHNIQUE: Contiguous axial images were obtained from the base of the skull through the vertex without intravenous contrast. COMPARISON:  September 13, 2019. FINDINGS: Brain: No evidence of acute infarction, hemorrhage, hydrocephalus, extra-axial collection or mass lesion/mass effect. Vascular: No hyperdense vessel or unexpected calcification. Skull: Normal. Negative for fracture or focal lesion. Sinuses/Orbits: No acute finding. Other: None. IMPRESSION: No acute intracranial abnormality seen. Electronically Signed   By: Marijo Conception M.D.   On: 05/16/2021 13:52     Procedures Procedures   Medications Ordered in ED Medications  prochlorperazine (COMPAZINE) injection 5 mg (5 mg Intravenous Given 05/16/21 1349)    ED Course  I have reviewed the triage vital signs and the nursing notes.  Pertinent labs & imaging results that were available during my care of the patient were reviewed by me and considered in my medical decision making (see chart for details).    MDM Rules/Calculators/A&P                           Patient with headache.  Has had for the last couple days.  Mildly blurred vision.  Has fairly recently had COVID.  Also recent sinus infections.  Nonfocal exam.  Feels better after Compazine treatment.  Head CT has been done and showed no acute abnormality.  With nonfocal exam and feeling better and feels patient can be discharged home with outpatient follow-up as needed.  Doubt intracranial hemorrhage.  Doubt meningitis.  Doubt other pathology such as central venous sinus thrombosis. Final Clinical Impression(s) / ED Diagnoses Final diagnoses:  Generalized headache    Rx / DC Orders ED Discharge Orders     None        Davonna Belling, MD 05/16/21 [phone]

## 2021-11-08 ENCOUNTER — Other Ambulatory Visit: Payer: Self-pay

## 2021-11-08 MED ORDER — AMLODIPINE BESYLATE 10 MG PO TABS: 10.0000 mg | ORAL_TABLET | Freq: Every day | ORAL | 3 refills | Status: DC

## 2021-12-05 ENCOUNTER — Other Ambulatory Visit: Payer: Self-pay | Admitting: Cardiovascular Disease

## 2021-12-05 NOTE — Telephone Encounter (Signed)
Rx(s) sent to pharmacy electronically.  

## 2021-12-26 ENCOUNTER — Other Ambulatory Visit: Payer: Self-pay | Admitting: Cardiovascular Disease

## 2022-01-15 ENCOUNTER — Other Ambulatory Visit: Payer: Self-pay | Admitting: Cardiovascular Disease

## 2022-01-27 ENCOUNTER — Other Ambulatory Visit: Payer: Self-pay | Admitting: Cardiovascular Disease

## 2022-01-29 NOTE — Telephone Encounter (Signed)
Rx(s) sent to pharmacy electronically.  

## 2022-04-14 ENCOUNTER — Other Ambulatory Visit: Payer: Self-pay | Admitting: Cardiovascular Disease

## 2022-05-22 ENCOUNTER — Telehealth: Payer: Self-pay | Admitting: Cardiovascular Disease

## 2022-05-22 MED ORDER — LOSARTAN POTASSIUM-HCTZ 100-12.5 MG PO TABS: 1.0000 | ORAL_TABLET | Freq: Every day | ORAL | 1 refills | Status: DC

## 2022-05-22 NOTE — Telephone Encounter (Signed)
Refilled as requested, patient has follow up appointment 08/2021 with Dr Oval Linsey

## 2022-05-22 NOTE — Telephone Encounter (Signed)
Pt c/o medication issue:  1. Name of Medication:  losartan-hydrochlorothiazide (HYZAAR) 100-12.5 MG tablet  2. How are you currently taking this medication (dosage and times per day)? Take 1 tablet by mouth daily  3. Are you having a reaction (difficulty breathing--STAT)? No   4. What is your medication issue? Patient needs a new 90 day, 3 refill prescription sent to [facility] [place]

## 2022-09-06 ENCOUNTER — Ambulatory Visit: Payer: Self-pay | Admitting: Family Medicine

## 2022-09-07 ENCOUNTER — Telehealth (HOSPITAL_BASED_OUTPATIENT_CLINIC_OR_DEPARTMENT_OTHER): Payer: Self-pay | Admitting: Cardiovascular Disease

## 2022-09-07 NOTE — Telephone Encounter (Signed)
Left message regarding  new appointment date and time (originial date and time 09/11/22 at 8:00 am---new appt date and time 09/13/22 8:00 am>  Dr. Oval Linsey no in the office 09/11/22

## 2022-09-11 ENCOUNTER — Ambulatory Visit (HOSPITAL_BASED_OUTPATIENT_CLINIC_OR_DEPARTMENT_OTHER): Payer: Self-pay | Admitting: Cardiovascular Disease

## 2022-09-13 ENCOUNTER — Ambulatory Visit (HOSPITAL_BASED_OUTPATIENT_CLINIC_OR_DEPARTMENT_OTHER): Payer: Self-pay | Admitting: Cardiovascular Disease

## 2022-09-13 NOTE — Progress Notes (Deleted)
Cardiology Office Note   Date:  09/13/2022   ID:  Melody Gardner, DOB 12-27-1965, MRN [id number]  PCP:  Carollee Herter, Alferd Apa, DO  Cardiologist:   Skeet Latch, MD   No chief complaint on file.    History of Present Illness: Melody Gardner is a 56 y.o. 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  Melody Gardner was admitted 12/18/15 with chest pain.  Cardiac enzymes were elevated and she had mild lateral ST elevation.  In the cath she was found to have a spontaneous dissection with 100% occlusion of D1 that was medically managed. Her echo showed LVEF 60-65% with grade 1 diastolic dysfunction.   Melody Gardner reports having her renal artery stented when she was around 64.   Given this history, there was concern for fibromuscular dysplasia.  She was referred for carotid ultrasound that revealed bilateral intimal thickening with 1-39% stenosis bilaterally but no plaque.   Melody Gardner underwent CT angiography of her whole body 03/2016.  She was found to have bilateral cervical ICA fibromuscular dysplasia.  Carotid ultrasound revealed 1-39% stenosis bilaterally.  She was evaluated by Dr. Scot Dock 07/04/16 and it was felt that no intervention was necessary. There was no FMD of the renal arteries.  She was also noted to have a 12 mm soft tissue nodule in the right neck suspicious for an ectopic parathyroid adenoma and dental caries.  She had a repeat CT of the head and neck 06/2018 that was unchanged from 2017.    Melody Gardner was last seen in 2020 and was doing well at that time.   Past Medical History:  Diagnosis Date   Abnormal uterine bleeding 01/30/2012   Benign endometrial biopsy, showed endometrial polyps    Acute MI, lateral wall, initial episode of care ([place]) 12/18/2015   Anemia 2013   Carotid artery disease ([place]) 01/30/2019   Mild 1-39%-  by CTA 9/19   Coronary  artery dissection 12/19/2015   SCAD March 2017- normal LVF then 60-65%, no other CAD   Crohn's disease ([place])    Crohn's disease of both small and large intestine without complication ([place]) 8/41/3244   Crohn's disease with complication ([place]) 0/07/2724   Depression    pt states she is depressed but not medically diagnosed   Endometrial polyp 01/31/2012   On endometrial biopsy done 01/30/12    Fibroids 01/30/2012   Hematochezia 03/05/2016   Hypertension 2012   Hypertensive heart disease 12/19/2015   Plantar fasciitis 01/02/2019   S/P hydrothermal endometrial ablation on 03/31/2012 03/31/2012    Past Surgical History:  Procedure Laterality Date   CARDIAC CATHETERIZATION N/A 12/18/2015   Procedure: Left Heart Cath and Coronary Angiography;  Surgeon: Sherren Mocha, MD;  Location: [facility];  Service: Cardiovascular;  Laterality: N/A;   ENDOMETRIAL ABLATION  2013   LAPAROSCOPY FOR ECTOPIC PREGNANCY     RENAL ARTERY STENT     TUBAL LIGATION  1989     Current Outpatient Medications  Medication Sig Dispense Refill   amLODipine (NORVASC) 10 MG tablet Take 1 tablet (10 mg total) by mouth daily. 180 tablet 3   aspirin EC 81 MG tablet Take 1 tablet (81 mg total) by mouth daily. 30 tablet 12   azaTHIOprine (IMURAN) 50 MG tablet Take 50 mg by mouth 3 (three) times daily.     balsalazide (COLAZAL) 750 MG capsule Take 2,250 mg  by mouth 3 (three) times daily.     fluticasone (FLONASE) 50 MCG/ACT nasal spray Place 1 spray into both nostrils daily. 5 g 0   losartan-hydrochlorothiazide (HYZAAR) 100-12.5 MG tablet Take 1 tablet by mouth daily. MUST KEEP DECEMBER APPOINTMENT 90 tablet 1   megestrol (MEGACE) 20 MG tablet Take 1 tablet (20 mg total) by mouth 2 (two) times daily. 60 tablet 3   metoprolol tartrate (LOPRESSOR) 25 MG tablet TAKE 1 TABLET(25 MG) BY MOUTH TWICE DAILY 180 tablet 3   Multiple Vitamins-Minerals (WOMENS ONE DAILY PO) Take 1 tablet by mouth daily.     nitroGLYCERIN (NITROSTAT) 0.4 MG SL  tablet Place 1 tablet (0.4 mg total) under the tongue every 5 (five) minutes as needed for chest pain. 25 tablet PRN   pantoprazole (PROTONIX) 20 MG tablet Take 1 tablet (20 mg total) by mouth daily. 30 tablet 3   sodium chloride (OCEAN) 0.65 % SOLN nasal spray Place 1 spray into both nostrils as needed for congestion. 15 mL 0   No current facility-administered medications for this visit.    Allergies:   Folic acid, Sulfa antibiotics, and Sulfasalazine    Social History:  The patient  reports that she has never smoked. She has never used smokeless tobacco. She reports that she does not drink alcohol and does not use drugs.   Family History:  The patient's family history includes Cancer in her brother; Diabetes in her brother and father; Hyperlipidemia in her father; Hypertension in her mother; Neuropathy in her brother and father; Stroke in her father.    ROS:  Please see the history of present illness.   Otherwise, review of systems are positive for none.   All other systems are reviewed and negative.    PHYSICAL EXAM: VS:  There were no vitals taken for this visit. , BMI There is no height or weight on file to calculate BMI. GENERAL:  Well appearing HEENT: Pupils equal round and reactive, fundi not visualized, oral mucosa unremarkable NECK:  No jugular venous distention, waveform within normal limits, carotid upstroke brisk and symmetric, no bruits LUNGS:  Clear to auscultation bilaterally HEART:  RRR.  PMI not displaced or sustained,S1 and S2 within normal limits, no S3, no S4, no clicks, no rubs, no murmurs ABD:  Flat, positive bowel sounds normal in frequency in pitch, no bruits, no rebound, no guarding, no midline pulsatile mass, no hepatomegaly, no splenomegaly EXT:  2 plus pulses throughout, no edema, no cyanosis no clubbing SKIN:  No rashes no nodules NEURO:  Cranial nerves II through XII grossly intact, motor grossly intact throughout PSYCH:  Cognitively intact, oriented to  person place and time   EKG:  EKG is not ordered today. 04/26/16: Sinus rhythm rate 79 bpm.  LVH 02/06/18: Sinus rhythm.  Rate 76 bpm.    LHC 12/18/15: 100% D1 due to spontaneous dissection.  No other coronary artery disease.  Echo 12/19/15: Study Conclusions   - Left ventricle: The cavity size was normal. Wall thickness was   normal. Systolic function was normal. The estimated ejection   fraction was in the range of 60% to 65%. Mildly abnormal GLPSS at   -17%, more regional inferior strain. Wall motion was normal;   there were no regional wall motion abnormalities. Doppler   parameters are consistent with abnormal left ventricular   relaxation (grade 1 diastolic dysfunction). The E/e&' ratio is   between 8-15, suggesting indeterminate LV filling pressure. - Left atrium: The atrium was normal in size. -  Tricuspid valve: There was no significant regurgitation. - Inferior vena cava: The vessel was normal in size. The   respirophasic diameter changes were in the normal range (= 50%),   consistent with normal central venous pressure.   Impressions:   - LVEF 60-65%, normal wall thickness, normal wall motion, mildly   abnormal GLPSS at -17% (inferior), Grade 1 DD with indeterminate   LV filling pressure, normal chamber sizes, normal IVC.  Carotid Doppler 01/12/16: Intimal thickening bilaterally.  1-39% ICA stenosis bilaterally.    Recent Labs: No results found for requested labs within last 365 days.    Lipid Panel    Component Value Date/Time   CHOL 110 09/11/2019 1449   CHOL 120 07/30/2019 1010   TRIG 81 09/11/2019 1449   HDL 34 (L) 09/11/2019 1449   HDL 37 (L) 07/30/2019 1010   CHOLHDL 3.2 09/11/2019 1449   VLDL 15.6 06/17/2018 1015   LDLCALC 60 09/11/2019 1449      Wt Readings from Last 3 Encounters:  05/16/21 174 lb (78.9 kg)  09/01/20 186 lb (84.4 kg)  08/04/20 187 lb (84.8 kg)      ASSESSMENT AND PLAN:  # Spontaneous coronary artery dissection:  #  Fibromuscular dysplasia: # ICA stenosis: Melody Gardner had SCAD of D1 and has bilateral ICA stenosis.  CT-A stable 06/2018.  She is due for fasting lipids and a CMP today.  She did have one episode of chest pain but it seems it was more related to GERD and gas.  She has no exertional symptoms.  We will refill her nitroglycerin today.  # Hypertension:  Blood pressure is well have been controlled.  Continue amlodipine, losartan, metoprolol, and HCTZ.  Check BMP today.   Current medicines are reviewed at length with the patient today.  The patient does not have concerns regarding medicines.  The following changes have been made:  none  Labs/ tests ordered today include:   No orders of the defined types were placed in this encounter.    Disposition:   FU with Karsten Howry C. Oval Linsey, MD, [facility] in 1 year.    Signed, Ralph Brouwer C. Oval Linsey, MD, [facility]  09/13/2022 7:52 AM    [facility]

## 2022-10-22 NOTE — Progress Notes (Signed)
Cardiology Office Note  Date:  10/23/2022   ID:  Melody Gardner, DOB 1965-12-18, MRN [id number]  PCP:  Carollee Herter, Alferd Apa, DO  Cardiologist:   Skeet Latch, MD   No chief complaint on file.    History of Present Illness: Melody Gardner is a 57 y.o. 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  Melody Gardner was admitted 12/18/15 with chest pain.  Cardiac enzymes were elevated and she had mild lateral ST elevation.  In the cath she was found to have a spontaneous dissection with 100% occlusion of D1 that was medically managed. Her echo showed LVEF 60-65% with grade 1 diastolic dysfunction.   Melody Gardner reports having her renal artery stented when she was around 83.   Given this history, there was concern for fibromuscular dysplasia.  She was referred for carotid ultrasound that revealed bilateral intimal thickening with 1-39% stenosis bilaterally but no plaque.   Melody Gardner underwent CT angiography of her whole body 03/2016.  She was found to have bilateral cervical ICA fibromuscular dysplasia.  Carotid ultrasound revealed 1-39% stenosis bilaterally.  She was evaluated by Dr. Scot Dock 07/04/16 and it was felt that no intervention was necessary. There was no FMD of the renal arteries.  She was also noted to have a 12 mm soft tissue nodule in the right neck suspicious for an ectopic parathyroid adenoma and dental caries.  She had a repeat CT of the head and neck 06/2018 that was unchanged from 2017.  She last saw Melody Memos, NP 08/2020 and was doing well. Blood pressure was slightly elevated in the office but controlled at home.  Today, she is feeling alright. However, she has been experiencing occasional central chest pains, most recently 2 weeks ago. She is not sure if this is actually acid reflux, since it did improve with taking Zantac. At the time she believes  she may have eaten too much, then lied down shortly after. In October she had temporary dyspnea that "felt funny." She would attribute this to becoming upset and gaining some weight. Similar episodes have not occurred. Generally she feels well with exercise without anginal symptoms. She used to run on the track routinely, but has fallen out of the habit in the last month. We also discussed dietary recommendations from a cardiovascular perspective. She denies any palpitations, or peripheral edema. No lightheadedness, headaches, syncope, orthopnea, or PND.   Past Medical History:  Diagnosis Date   Abnormal uterine bleeding 01/30/2012   Benign endometrial biopsy, showed endometrial polyps    Acute MI, lateral wall, initial episode of care ([place]) 12/18/2015   Anemia 2013   Carotid artery disease ([place]) 01/30/2019   Mild 1-39%-  by CTA 9/19   Coronary artery dissection 12/19/2015   SCAD March 2017- normal LVF then 60-65%, no other CAD   Crohn's disease ([place])    Crohn's disease of both small and large intestine without complication ([place]) 7/56/4332   Crohn's disease with complication ([place]) 9/51/8841   Depression    pt states she is depressed but not medically diagnosed   Endometrial polyp 01/31/2012   On endometrial biopsy done 01/30/12    Fibroids 01/30/2012   Hematochezia 03/05/2016   Hypertension 2012   Hypertensive heart disease 12/19/2015   Plantar fasciitis 01/02/2019   S/P hydrothermal endometrial ablation on 03/31/2012 03/31/2012    Past Surgical History:  Procedure Laterality Date   CARDIAC  CATHETERIZATION N/A 12/18/2015   Procedure: Left Heart Cath and Coronary Angiography;  Surgeon: Sherren Mocha, MD;  Location: [facility];  Service: Cardiovascular;  Laterality: N/A;   ENDOMETRIAL ABLATION  2013   LAPAROSCOPY FOR ECTOPIC PREGNANCY     RENAL ARTERY STENT     TUBAL LIGATION  1989     Current Outpatient Medications  Medication Sig Dispense Refill   amLODipine (NORVASC) 10 MG tablet Take 1  tablet (10 mg total) by mouth daily. 180 tablet 3   aspirin EC 81 MG tablet Take 1 tablet (81 mg total) by mouth daily. 30 tablet 12   fluticasone (FLONASE) 50 MCG/ACT nasal spray Place 1 spray into both nostrils daily. 5 g 0   losartan-hydrochlorothiazide (HYZAAR) 100-12.5 MG tablet Take 1 tablet by mouth daily. MUST KEEP DECEMBER APPOINTMENT 90 tablet 1   metoprolol tartrate (LOPRESSOR) 25 MG tablet TAKE 1 TABLET(25 MG) BY MOUTH TWICE DAILY 180 tablet 3   Multiple Vitamins-Minerals (WOMENS ONE DAILY PO) Take 1 tablet by mouth daily.     nitroGLYCERIN (NITROSTAT) 0.4 MG SL tablet Place 1 tablet (0.4 mg total) under the tongue every 5 (five) minutes as needed for chest pain. 25 tablet PRN   sodium chloride (OCEAN) 0.65 % SOLN nasal spray Place 1 spray into both nostrils as needed for congestion. 15 mL 0   No current facility-administered medications for this visit.    Allergies:   Folic acid, Sulfa antibiotics, and Sulfasalazine    Social History:  The patient  reports that she has never smoked. She has never used smokeless tobacco. She reports that she does not drink alcohol and does not use drugs.   Family History:  The patient's family history includes Cancer in her brother; Diabetes in her brother and father; Hyperlipidemia in her father; Hypertension in her mother; Neuropathy in her brother and father; Stroke in her father.    ROS:   Please see the history of present illness.    (+) Intermittent chest discomfort/acid reflux All other systems are reviewed and negative.   PHYSICAL EXAM: VS:  BP 112/81 (BP Location: Right Arm, Patient Position: Sitting, Cuff Size: Large)   Pulse 81   Ht '5\' 7"'$  (1.702 m)   Wt 190 lb 8 oz (86.4 kg)   BMI 29.84 kg/m  , BMI Body mass index is 29.84 kg/m. GENERAL:  Well appearing HEENT: Pupils equal round and reactive, fundi not visualized, oral mucosa unremarkable NECK:  No jugular venous distention, waveform within normal limits, carotid upstroke  brisk and symmetric, no bruits LUNGS:  Clear to auscultation bilaterally HEART:  RRR.  PMI not displaced or sustained,S1 and S2 within normal limits, no S3, no S4, no clicks, no rubs, no murmurs ABD:  Flat, positive bowel sounds normal in frequency in pitch, no bruits, no rebound, no guarding, no midline pulsatile mass, no hepatomegaly, no splenomegaly EXT:  2 plus pulses throughout, no edema, no cyanosis no clubbing SKIN:  No rashes no nodules NEURO:  Cranial nerves II through XII grossly intact, motor grossly intact throughout PSYCH:  Cognitively intact, oriented to person place and time   EKG:   EKG is personally reviewed. 10/23/2022:  Sinus rhythm. Rate 81 bpm. 02/06/18: Sinus rhythm.  Rate 76 bpm.   04/26/16: Sinus rhythm rate 79 bpm.  LVH   Carotid Doppler 01/12/16: Intimal thickening bilaterally.  1-39% ICA stenosis bilaterally.    Echo 12/19/15: Study Conclusions - Left ventricle: The cavity size was normal. Wall thickness was  normal. Systolic function was normal. The estimated ejection   fraction was in the range of 60% to 65%. Mildly abnormal GLPSS at   -17%, more regional inferior strain. Wall motion was normal;   there were no regional wall motion abnormalities. Doppler   parameters are consistent with abnormal left ventricular   relaxation (grade 1 diastolic dysfunction). The E/e&' ratio is   between 8-15, suggesting indeterminate LV filling pressure. - Left atrium: The atrium was normal in size. - Tricuspid valve: There was no significant regurgitation. - Inferior vena cava: The vessel was normal in size. The   respirophasic diameter changes were in the normal range (= 50%),   consistent with normal central venous pressure.   Impressions: - LVEF 60-65%, normal wall thickness, normal wall motion, mildly   abnormal GLPSS at -17% (inferior), Grade 1 DD with indeterminate   LV filling pressure, normal chamber sizes, normal IVC.  LHC 12/18/15: 100% D1 due to spontaneous  dissection.  No other coronary artery disease.   Recent Labs: No results found for requested labs within last 365 days.    Lipid Panel    Component Value Date/Time   CHOL 110 09/11/2019 1449   CHOL 120 07/30/2019 1010   TRIG 81 09/11/2019 1449   HDL 34 (L) 09/11/2019 1449   HDL 37 (L) 07/30/2019 1010   CHOLHDL 3.2 09/11/2019 1449   VLDL 15.6 06/17/2018 1015   LDLCALC 60 09/11/2019 1449      Wt Readings from Last 3 Encounters:  10/23/22 190 lb 8 oz (86.4 kg)  05/16/21 174 lb (78.9 kg)  09/01/20 186 lb (84.4 kg)      ASSESSMENT AND PLAN:  Coronary artery dissection SCAD of the diagonal in 2017.  She had one episode of chest pain that occurred in the setting of GERD.  However, she does have a history of spontaneous coronary artery dissection and she has not been very physically active lately.  Will get a coronary CT-A to assess for new obstructive disease.  Continue aspirin metoprolol.  HTN (hypertension) Blood pressure well-controlled on, losartan/HCTZ, and amlodipine.  Encouraged to work on increasing her exercise.  Carotid artery disease (HCC) Mild carotid stenosis on prior imaging.  Repeat carotid Dopplers.  Check fasting lipids and CMP.   Disposition:   FU with Danilo Cappiello C. Oval Linsey, MD, [facility] in 1 year.  Medication Adjustments/Labs and Tests Ordered: Current medicines are reviewed at length with the patient today.  Concerns regarding medicines are outlined above.   Orders Placed This Encounter  Procedures   CT CORONARY MORPH W/CTA COR W/SCORE W/CA W/CM &/OR WO/CM   Lipid panel   Comprehensive metabolic panel   Amb Referral To Provider Referral Exercise Program (P.R.E.P)   EKG 12-Lead   VAS US CAROTID   No orders of the defined types were placed in this encounter.  I,Mathew Stumpf,acting as a Education administrator for Skeet Latch, MD.,have documented all relevant documentation on the behalf of Skeet Latch, MD,as directed by  Skeet Latch, MD while in the presence  of Skeet Latch, MD.  I, [place] Oval Linsey, MD have reviewed all documentation for this visit.  The documentation of the exam, diagnosis, procedures, and orders on 10/23/2022 are all accurate and complete.   Signed, Alexy Bringle C. Oval Linsey, MD, [facility]  10/23/2022 9:51 AM    [place]

## 2022-10-23 ENCOUNTER — Ambulatory Visit (INDEPENDENT_AMBULATORY_CARE_PROVIDER_SITE_OTHER): Payer: Self-pay | Admitting: Cardiovascular Disease

## 2022-10-23 ENCOUNTER — Encounter (HOSPITAL_BASED_OUTPATIENT_CLINIC_OR_DEPARTMENT_OTHER): Payer: Self-pay | Admitting: Cardiovascular Disease

## 2022-10-23 VITALS — BP 112/81 | HR 81 | Ht 67.0 in | Wt 190.5 lb

## 2022-10-23 DIAGNOSIS — I10 Essential (primary) hypertension: Secondary | ICD-10-CM

## 2022-10-23 DIAGNOSIS — I2542 Coronary artery dissection: Secondary | ICD-10-CM

## 2022-10-23 DIAGNOSIS — R079 Chest pain, unspecified: Secondary | ICD-10-CM

## 2022-10-23 DIAGNOSIS — I6523 Occlusion and stenosis of bilateral carotid arteries: Secondary | ICD-10-CM

## 2022-10-23 NOTE — Assessment & Plan Note (Signed)
SCAD of the diagonal in 2017.  She had one episode of chest pain that occurred in the setting of GERD.  However, she does have a history of spontaneous coronary artery dissection and she has not been very physically active lately.  Will get a coronary CT-A to assess for new obstructive disease.  Continue aspirin metoprolol.

## 2022-10-23 NOTE — Patient Instructions (Addendum)
Medication Instructions:  TAKE METOPROLOL 100 MG 2 HOURS PRIOR TO CT   *If you need a refill on your cardiac medications before your next appointment, please call your pharmacy*  Lab Work: FASTING LP/CMET 1 WEEK PRIOR TO CT   If you have labs (blood work) drawn today and your tests are completely normal, you will receive your results only by: [place] (if you have MyChart) OR A paper copy in the mail If you have any lab test that is abnormal or we need to change your treatment, we will call you to review the results.  Testing/Procedures: Your physician has requested that you have cardiac CT. Cardiac computed tomography (CT) is a painless test that uses an x-ray machine to take clear, detailed pictures of your heart. For further information please visit [email or web address]. Please follow instruction sheet as given.  Your physician has requested that you have a carotid duplex. This test is an ultrasound of the carotid arteries in your neck. It looks at blood flow through these arteries that supply the brain with blood. Allow one hour for this exam. There are no restrictions or special instructions.  Follow-Up: At [facility], you and your health needs are our priority.  As part of our continuing mission to provide you with exceptional heart care, we have created designated Provider Care Teams.  These Care Teams include your primary Cardiologist (physician) and Advanced Practice Providers (APPs -  Physician Assistants and Nurse Practitioners) who all work together to provide you with the care you need, when you need it.  We recommend signing up for the patient portal called "MyChart".  Sign up information is provided on this After Visit Summary.  MyChart is used to connect with patients for Virtual Visits (Telemedicine).  Patients are able to view lab/test results, encounter notes, upcoming appointments, etc.  Non-urgent messages can be sent to your provider as well.   To learn  more about what you can do with MyChart, go to [email or web address].    Your next appointment:   12 month(s)  Provider:   Skeet Latch, MD    Other Instructions PAM OR LORA FROM THE PREP ([facility]) PROGRAM WILL BE IN [facility] WITH YOU   Your cardiac CT will be scheduled at one of the below locations:   [facility] [address] [place], [place] [address] [phone]  [place] [address] [place] [employer], [place] [address] [phone]  [facility] [place], [place] [address] [phone]  If scheduled at [facility] please arrive at the [facility] and Children's Entrance (Entrance C2) of [facility] 30 minutes prior to test start time. You can use the FREE valet parking offered at entrance C (encouraged to control the heart rate for the test)  Proceed to the [facility] Radiology Department (first floor) to check-in and test prep.  All radiology patients and guests should use entrance C2 at [facility], accessed from [facility], even though the hospital's physical address listed is [address].    If scheduled at [facility] or [facility], please arrive 15 mins early for check-in and test prep.   Please follow these instructions carefully (unless otherwise directed):  On the Night Before the Test: Be sure to Drink plenty of water. Do not consume any caffeinated/decaffeinated beverages or chocolate 12 hours prior to your test. Do not take  any antihistamines 12 hours prior to your test.  On the Day of the Test: Drink plenty of water until 1 hour prior to the test. Do not eat any food 1 hour prior to test. You may take your regular medications prior to the test.  Take metoprolol (Lopressor) two hours prior to test. HOLD Furosemide/Hydrochlorothiazide morning of the  test. FEMALES- please wear underwire-free bra if available, avoid dresses & tight clothing      After the Test: Drink plenty of water. After receiving IV contrast, you may experience a mild flushed feeling. This is normal. On occasion, you may experience a mild rash up to 24 hours after the test. This is not dangerous. If this occurs, you can take Benadryl 25 mg and increase your fluid intake. If you experience trouble breathing, this can be serious. If it is severe call 911 IMMEDIATELY. If it is mild, please call our office. If you take any of these medications: Glipizide/Metformin, Avandament, Glucavance, please do not take 48 hours after completing test unless otherwise instructed.  We will call to schedule your test 2-4 weeks out understanding that some insurance companies will need an authorization prior to the service being performed.   For non-scheduling related questions, please contact the cardiac imaging nurse navigator should you have any questions/concerns: Marchia Bond, Cardiac Imaging Nurse Navigator Gordy Clement, Cardiac Imaging Nurse Navigator [facility] Direct Office Dial: [phone]   For scheduling needs, including cancellations and rescheduling, please call [name], [phone].  Cardiac CT Angiogram A cardiac CT angiogram is a procedure to look at the heart and the area around the heart. It may be done to help find the cause of chest pains or other symptoms of heart disease. During this procedure, a substance called contrast dye is injected into the blood vessels in the area to be checked. A large X-ray machine, called a CT scanner, then takes detailed pictures of the heart and the surrounding area. The procedure is also sometimes called a coronary CT angiogram, coronary artery scanning, or CTA. A cardiac CT angiogram allows the health care provider to see how well blood is flowing to and from the heart. The health care provider will be  able to see if there are any problems, such as: Blockage or narrowing of the coronary arteries in the heart. Fluid around the heart. Signs of weakness or disease in the muscles, valves, and tissues of the heart. Tell a health care provider about: Any allergies you have. This is especially important if you have had a previous allergic reaction to contrast dye. All medicines you are taking, including vitamins, herbs, eye drops, creams, and over-the-counter medicines. Any blood disorders you have. Any surgeries you have had. Any medical conditions you have. Whether you are pregnant or may be pregnant. Any anxiety disorders, chronic pain, or other conditions you have that may increase your stress or prevent you from lying still. What are the risks? Generally, this is a safe procedure. However, problems may occur, including: Bleeding. Infection. Allergic reactions to medicines or dyes. Damage to other structures or organs. Kidney damage from the contrast dye that is used. Increased risk of cancer from radiation exposure. This risk is low. Talk with your health care provider about: The risks and benefits of testing. How you can receive the lowest dose of radiation. What happens before the procedure? Wear comfortable clothing and remove any jewelry, glasses, dentures, and hearing aids. Follow instructions from your health care provider about eating and drinking.  This may include: For 12 hours before the procedure -- avoid caffeine. This includes tea, coffee, soda, energy drinks, and diet pills. Drink plenty of water or other fluids that do not have caffeine in them. Being well hydrated can prevent complications. For 4-6 hours before the procedure -- stop eating and drinking. The contrast dye can cause nausea, but this is less likely if your stomach is empty. Ask your health care provider about changing or stopping your regular medicines. This is especially important if you are taking diabetes  medicines, blood thinners, or medicines to treat problems with erections (erectile dysfunction). What happens during the procedure?  Hair on your chest may need to be removed so that small sticky patches called electrodes can be placed on your chest. These will transmit information that helps to monitor your heart during the procedure. An IV will be inserted into one of your veins. You might be given a medicine to control your heart rate during the procedure. This will help to ensure that good images are obtained. You will be asked to lie on an exam table. This table will slide in and out of the CT machine during the procedure. Contrast dye will be injected into the IV. You might feel warm, or you may get a metallic taste in your mouth. You will be given a medicine called nitroglycerin. This will relax or dilate the arteries in your heart. The table that you are lying on will move into the CT machine tunnel for the scan. The person running the machine will give you instructions while the scans are being done. You may be asked to: Keep your arms above your head. Hold your breath. Stay very still, even if the table is moving. When the scanning is complete, you will be moved out of the machine. The IV will be removed. The procedure may vary among health care providers and hospitals. What can I expect after the procedure? After your procedure, it is common to have: A metallic taste in your mouth from the contrast dye. A feeling of warmth. A headache from the nitroglycerin. Follow these instructions at home: Take over-the-counter and prescription medicines only as told by your health care provider. If you are told, drink enough fluid to keep your urine pale yellow. This will help to flush the contrast dye out of your body. Most people can return to their normal activities right after the procedure. Ask your health care provider what activities are safe for you. It is up to you to get the results  of your procedure. Ask your health care provider, or the department that is doing the procedure, when your results will be ready. Keep all follow-up visits as told by your health care provider. This is important. Contact a health care provider if: You have any symptoms of allergy to the contrast dye. These include: Shortness of breath. Rash or hives. A racing heartbeat. Summary A cardiac CT angiogram is a procedure to look at the heart and the area around the heart. It may be done to help find the cause of chest pains or other symptoms of heart disease. During this procedure, a large X-ray machine, called a CT scanner, takes detailed pictures of the heart and the surrounding area after a contrast dye has been injected into blood vessels in the area. Ask your health care provider about changing or stopping your regular medicines before the procedure. This is especially important if you are taking diabetes medicines, blood thinners, or medicines to treat  erectile dysfunction. If you are told, drink enough fluid to keep your urine pale yellow. This will help to flush the contrast dye out of your body. This information is not intended to replace advice given to you by your health care provider. Make sure you discuss any questions you have with your health care provider. Document Revised: 01/04/2022 Document Reviewed: 05/13/2019 Elsevier Patient Education  [place].

## 2022-10-23 NOTE — Assessment & Plan Note (Signed)
Mild carotid stenosis on prior imaging.  Repeat carotid Dopplers.  Check fasting lipids and CMP.

## 2022-10-23 NOTE — Assessment & Plan Note (Signed)
Blood pressure well-controlled on, losartan/HCTZ, and amlodipine.  Encouraged to work on increasing her exercise.

## 2022-10-24 ENCOUNTER — Telehealth: Payer: Self-pay | Admitting: Licensed Clinical Social Worker

## 2022-10-24 NOTE — Progress Notes (Signed)
Heart and Vascular Care Navigation  10/24/2022  Elyanna Wallick 09-01-1966 [id number]  Reason for Referral: uninsured at this time Patient is participating in a Managed Medicaid Plan: No, self pay only.   Engaged with patient by telephone for initial visit for Heart and Vascular Care Coordination.                                                                                                   Assessment:                                     LCSW was able to reach pt this morning at [phone], introduced self, role, reason for call. Pt confirmed home address, PCP, and emergency contact remains her sister Ina Homes.  Pt lives alone- is employed part time, will start a new job soon that will include health benefits. LCSW shares that she otherwise has no concerns about cost of living- rent, utilities, food or medications. She has access to transportation and someone to drive if she cannot. She is interested in applying for CAFA until her benefits begin. She was encouraged to reach out to me with any additional questions/concerns.   HRT/VAS Care Coordination     Patients Home Cardiology Office --  [place]   Outpatient Care Team Social Worker   Social Worker Name: Westley Hummer, , [employer]   Living arrangements for the past 2 months Apartment   Lives with: Self   Patient Current Insurance Coverage Self-Pay   Patient Has Concern With Paying Medical Bills Yes   Patient Concerns With Medical Bills in between coverage plans at this time   Medical Bill Referrals: [facility] until coverage begins   Does Patient Have Prescription Coverage? No   Home Assistive Devices/Equipment None       Social History:                                                                             SDOH Screenings   Food Insecurity: No Food Insecurity (10/24/2022)  Housing: Low Risk  (10/24/2022)  Transportation Needs: No Transportation Needs (10/24/2022)  Utilities: Not At Risk  (10/24/2022)  Depression (PHQ2-9): Low Risk  (02/14/2021)  Financial Resource Strain: Low Risk  (10/24/2022)  Tobacco Use: Low Risk  (10/23/2022)    SDOH Interventions: Financial Resources:  Financial Strain Interventions: Other (Comment) (CAFA until other coverage begins) Occupational hygienist for [employer]  Food Insecurity:  Food Insecurity Interventions: Intervention Not Indicated  Housing Insecurity:  Housing Interventions: Intervention Not Indicated  Transportation:   Transportation Interventions: Intervention Not Indicated    Other Care Navigation Interventions:     Provided Pharmacy assistance resources  Denies any financial challenges at this time  Follow-up plan:   LCSW mailed the following: my card, Medicaid flyer, [employer] application and will f/u with pt to see if received/answer any additional questions/concerns.

## 2022-10-25 ENCOUNTER — Telehealth (HOSPITAL_BASED_OUTPATIENT_CLINIC_OR_DEPARTMENT_OTHER): Payer: Self-pay | Admitting: Cardiovascular Disease

## 2022-10-25 NOTE — Telephone Encounter (Signed)
Left message for patient to call and discuss scheduling the Carotid duples ordered by Dr. Oval Linsey

## 2022-11-01 DIAGNOSIS — Z419 Encounter for procedure for purposes other than remedying health state, unspecified: Secondary | ICD-10-CM | POA: Diagnosis not present

## 2022-11-05 ENCOUNTER — Telehealth: Payer: Self-pay | Admitting: Licensed Clinical Social Worker

## 2022-11-05 NOTE — Telephone Encounter (Signed)
H&V Care Navigation CSW Progress Note  Clinical Social Worker contacted patient by phone to f/u on assistance application. Note pt now has [employer] on patient account for upcoming appt. LCSW was able to reach pt at [phone], she confirms receiving application, still working on it, no questions at this time. Encouraged her to reach out to me if needed with any questions, I will not actively follow but remain available as needed.   Patient is participating in a Managed Medicaid Plan:  No, commercial insurance only.   SDOH Screenings   Food Insecurity: No Food Insecurity (10/24/2022)  Housing: Low Risk  (10/24/2022)  Transportation Needs: No Transportation Needs (10/24/2022)  Utilities: Not At Risk (10/24/2022)  Depression (PHQ2-9): Low Risk  (02/14/2021)  Financial Resource Strain: Low Risk  (10/24/2022)  Tobacco Use: Low Risk  (10/23/2022)   Westley Hummer, MSW, [place]  [phone]- work cell phone (preferred) [phone]- desk phone

## 2022-11-06 ENCOUNTER — Ambulatory Visit (HOSPITAL_COMMUNITY): Admission: RE | Admit: 2022-11-06 | Payer: 59 | Source: Ambulatory Visit

## 2022-11-08 ENCOUNTER — Ambulatory Visit (HOSPITAL_COMMUNITY): Admission: RE | Admit: 2022-11-08 | Payer: PRIVATE HEALTH INSURANCE | Source: Ambulatory Visit

## 2022-11-09 ENCOUNTER — Telehealth (HOSPITAL_COMMUNITY): Payer: Self-pay | Admitting: Emergency Medicine

## 2022-11-09 NOTE — Telephone Encounter (Signed)
Attempted to call patient regarding upcoming cardiac CT appointment. °Left message on voicemail with name and callback number °Nayara Taplin RN Navigator Cardiac Imaging °[facility] °[phone] Office °[phone] Cell ° °

## 2022-11-11 ENCOUNTER — Other Ambulatory Visit: Payer: Self-pay | Admitting: Cardiovascular Disease

## 2022-11-12 ENCOUNTER — Ambulatory Visit (HOSPITAL_COMMUNITY): Admission: RE | Admit: 2022-11-12 | Payer: 59 | Source: Ambulatory Visit

## 2022-11-12 NOTE — Telephone Encounter (Signed)
Rx(s) sent to pharmacy electronically.  

## 2022-11-13 ENCOUNTER — Encounter: Payer: 59 | Admitting: Family Medicine

## 2022-11-13 NOTE — Progress Notes (Incomplete)
Subjective:   By signing my name below, I, Melody Gardner, attest that this documentation has been prepared under the direction and in the presence of Ann Held, DO. 11/13/2022.    Patient ID: Melody Gardner, female    DOB: July 17, 1966, 57 y.o.   MRN: [id number]  No chief complaint on file.   HPI Patient is in today for a comprehensive physical exam.    Social history: Colonoscopy:  Last completed 09/23/2015. Dexa: Pap Smear: Last completed 05/21/2019. Mammogram: Last completed 06/26/2019. Immunizations: She has never received a shingles vaccine. She last received the influenza vaccine 06/17/2019. COVID vaccine last received 07/09/2020. Tdap last received 01/06/2014. Diet: Exercise: Dental: Vision:  Denies having any fever, new muscle pain, joint pain, new moles, congestion, sinus pain, sore throat, chest pain, palpitations, cough, SOB, wheezing, n/v/d, constipation, blood in stool, dysuria, frequency, hematuria, at this time.  Past Medical History:  Diagnosis Date   Abnormal uterine bleeding 01/30/2012   Benign endometrial biopsy, showed endometrial polyps    Acute MI, lateral wall, initial episode of care ([place]) 12/18/2015   Anemia 2013   Carotid artery disease ([place]) 01/30/2019   Mild 1-39%-  by CTA 9/19   Coronary artery dissection 12/19/2015   SCAD March 2017- normal LVF then 60-65%, no other CAD   Crohn's disease ([place])    Crohn's disease of both small and large intestine without complication ([place]) [id number]   Crohn's disease with complication ([place]) [id number]   Depression    pt states she is depressed but not medically diagnosed   Endometrial polyp 01/31/2012   On endometrial biopsy done 01/30/12    Fibroids 01/30/2012   Hematochezia 03/05/2016   Hypertension 2012   Hypertensive heart disease 12/19/2015   Plantar fasciitis 01/02/2019   S/P hydrothermal endometrial ablation on 03/31/2012 03/31/2012    Past Surgical History:  Procedure Laterality Date   CARDIAC CATHETERIZATION N/A  12/18/2015   Procedure: Left Heart Cath and Coronary Angiography;  Surgeon: Sherren Mocha, MD;  Location: [facility];  Service: Cardiovascular;  Laterality: N/A;   ENDOMETRIAL ABLATION  2013   LAPAROSCOPY FOR ECTOPIC PREGNANCY     RENAL ARTERY STENT     TUBAL LIGATION  1989    Family History  Problem Relation Age of Onset   Hypertension Mother    Cancer Brother    Diabetes Brother    Diabetes Father    Stroke Father    Neuropathy Father    Hyperlipidemia Father    Neuropathy Brother    Thyroid disease Neg Hx     Social History   Socioeconomic History   Marital status: Legally Separated    Spouse name: Not on file   Number of children: Not on file   Years of education: Not on file   Highest education level: Not on file  Occupational History    Comment: [employer]  Tobacco Use   Smoking status: Never   Smokeless tobacco: Never  Substance and Sexual Activity   Alcohol use: No   Drug use: No   Sexual activity: Yes    Birth control/protection: Surgical  Other Topics Concern   Not on file  Social History Narrative   Not on file   Social Determinants of Health   Financial Resource Strain: Low Risk  (10/24/2022)   Overall Financial Resource Strain (CARDIA)    Difficulty of Paying Living Expenses: Not very hard  Food Insecurity: No Food Insecurity (10/24/2022)   Hunger Vital Sign  Worried About Charity fundraiser in the Last Year: Never true    [place] in the Last Year: Never true  Transportation Needs: No Transportation Needs (10/24/2022)   PRAPARE - Hydrologist (Medical): No    Lack of Transportation (Non-Medical): No  Physical Activity: Not on file  Stress: Not on file  Social Connections: Not on file  Intimate Partner Violence: Not on file    Outpatient Medications Prior to Visit  Medication Sig Dispense Refill   amLODipine (NORVASC) 10 MG tablet TAKE 1 TABLET BY MOUTH EVERY DAY 180 tablet 3   aspirin EC 81  MG tablet Take 1 tablet (81 mg total) by mouth daily. 30 tablet 12   fluticasone (FLONASE) 50 MCG/ACT nasal spray Place 1 spray into both nostrils daily. 5 g 0   losartan-hydrochlorothiazide (HYZAAR) 100-12.5 MG tablet Take 1 tablet by mouth daily. MUST KEEP DECEMBER APPOINTMENT 90 tablet 1   metoprolol tartrate (LOPRESSOR) 25 MG tablet TAKE 1 TABLET(25 MG) BY MOUTH TWICE DAILY 180 tablet 3   Multiple Vitamins-Minerals (WOMENS ONE DAILY PO) Take 1 tablet by mouth daily.     nitroGLYCERIN (NITROSTAT) 0.4 MG SL tablet Place 1 tablet (0.4 mg total) under the tongue every 5 (five) minutes as needed for chest pain. 25 tablet PRN   sodium chloride (OCEAN) 0.65 % SOLN nasal spray Place 1 spray into both nostrils as needed for congestion. 15 mL 0   No facility-administered medications prior to visit.    Allergies  Allergen Reactions   Folic Acid Rash    Pt declines this allergy   Sulfa Antibiotics Rash   Sulfasalazine Itching and Rash    Review of Systems  Constitutional:  Negative for fever.  HENT:  Negative for congestion, sinus pain and sore throat.   Respiratory:  Negative for cough, shortness of breath and wheezing.   Cardiovascular:  Negative for chest pain and palpitations.  Gastrointestinal:  Negative for blood in stool, constipation, diarrhea, nausea and vomiting.  Genitourinary:  Negative for dysuria, frequency and hematuria.  Musculoskeletal:  Negative for joint pain and myalgias.       Objective:    Physical Exam Constitutional:      Appearance: Normal appearance.  HENT:     Head: Normocephalic and atraumatic.     Right Ear: Tympanic membrane, ear canal and external ear normal.     Left Ear: Tympanic membrane, ear canal and external ear normal.  Eyes:     Extraocular Movements: Extraocular movements intact.     Pupils: Pupils are equal, round, and reactive to light.  Cardiovascular:     Rate and Rhythm: Normal rate and regular rhythm.     Heart sounds: Normal heart  sounds. No murmur heard.    No gallop.  Pulmonary:     Effort: Pulmonary effort is normal. No respiratory distress.     Breath sounds: Normal breath sounds. No wheezing or rales.  Abdominal:     General: Bowel sounds are normal. There is no distension.     Palpations: Abdomen is soft.     Tenderness: There is no abdominal tenderness. There is no guarding.  Musculoskeletal:        General: Normal range of motion.  Skin:    General: Skin is warm and dry.  Neurological:     General: No focal deficit present.     Mental Status: She is alert and oriented to person, place, and time.  Psychiatric:  Mood and Affect: Mood normal.        Behavior: Behavior normal.     There were no vitals taken for this visit. Wt Readings from Last 3 Encounters:  10/23/22 190 lb 8 oz (86.4 kg)  05/16/21 174 lb (78.9 kg)  09/01/20 186 lb (84.4 kg)    Diabetic Foot Exam - Simple   No data filed    Lab Results  Component Value Date   WBC 7.2 05/16/2021   HGB 15.0 05/16/2021   HCT 45.1 05/16/2021   PLT 363 05/16/2021   GLUCOSE 98 05/16/2021   CHOL 110 09/11/2019   TRIG 81 09/11/2019   HDL 34 (L) 09/11/2019   LDLCALC 60 09/11/2019   ALT 12 09/11/2019   AST 19 09/11/2019   NA 137 05/16/2021   K 3.4 (L) 05/16/2021   CL 102 05/16/2021   CREATININE 0.98 05/16/2021   BUN 13 05/16/2021   CO2 28 05/16/2021   TSH 1.27 09/11/2019    Lab Results  Component Value Date   TSH 1.27 09/11/2019   Lab Results  Component Value Date   WBC 7.2 05/16/2021   HGB 15.0 05/16/2021   HCT 45.1 05/16/2021   MCV 90.0 05/16/2021   PLT 363 05/16/2021   Lab Results  Component Value Date   NA 137 05/16/2021   K 3.4 (L) 05/16/2021   CO2 28 05/16/2021   GLUCOSE 98 05/16/2021   BUN 13 05/16/2021   CREATININE 0.98 05/16/2021   BILITOT 0.9 09/11/2019   ALKPHOS 60 07/30/2019   AST 19 09/11/2019   ALT 12 09/11/2019   PROT 7.2 09/11/2019   ALBUMIN 4.0 07/30/2019   CALCIUM 9.8 05/16/2021   ANIONGAP 7  05/16/2021   GFR 102.61 06/17/2018   Lab Results  Component Value Date   CHOL 110 09/11/2019   Lab Results  Component Value Date   HDL 34 (L) 09/11/2019   Lab Results  Component Value Date   LDLCALC 60 09/11/2019   Lab Results  Component Value Date   TRIG 81 09/11/2019   Lab Results  Component Value Date   CHOLHDL 3.2 09/11/2019   No results found for: "HGBA1C"     Assessment & Plan:   Problem List Items Addressed This Visit   None    No orders of the defined types were placed in this encounter.   Alphonzo Grieve, personally preformed the services described in this documentation.  All medical record entries made by the scribe were at my direction and in my presence.  I have reviewed the chart and discharge instructions (if applicable) and agree that the record reflects my personal performance and is accurate and complete. 11/13/2022.  I,Mathew Stumpf,acting as a Education administrator for [employer], DO.,have documented all relevant documentation on the behalf of Ann Held, DO,as directed by  Ann Held, DO while in the presence of Ann Held, DO.   Melody Gardner

## 2022-11-23 ENCOUNTER — Telehealth (HOSPITAL_BASED_OUTPATIENT_CLINIC_OR_DEPARTMENT_OTHER): Payer: Self-pay | Admitting: Cardiovascular Disease

## 2022-11-23 NOTE — Telephone Encounter (Signed)
Lm for patient to call and discuss rescheduling the Carotid doppler ordered by Dr. Oval Linsey

## 2022-11-28 NOTE — Telephone Encounter (Signed)
Left message for patient to call and discuss rescheduling the Carotid doppler ordered by Dr. Oval Linsey

## 2022-11-30 DIAGNOSIS — Z419 Encounter for procedure for purposes other than remedying health state, unspecified: Secondary | ICD-10-CM | POA: Diagnosis not present

## 2022-12-04 NOTE — Telephone Encounter (Signed)
Left message for patient to call and discuss rescheduling the Carotid doppler ordered by Dr. Oval Linsey

## 2022-12-07 ENCOUNTER — Encounter (HOSPITAL_BASED_OUTPATIENT_CLINIC_OR_DEPARTMENT_OTHER): Payer: Self-pay | Admitting: Cardiovascular Disease

## 2022-12-07 NOTE — Telephone Encounter (Signed)
Letter mailed to patient requesting she call to schedule

## 2022-12-31 DIAGNOSIS — Z419 Encounter for procedure for purposes other than remedying health state, unspecified: Secondary | ICD-10-CM | POA: Diagnosis not present

## 2023-01-02 ENCOUNTER — Ambulatory Visit (HOSPITAL_COMMUNITY)
Admission: RE | Admit: 2023-01-02 | Discharge: 2023-01-02 | Disposition: A | Discharge status: Home / Self Care | Payer: Medicaid Other | Source: Ambulatory Visit | Attending: Cardiology | Admitting: Cardiology

## 2023-01-02 DIAGNOSIS — I10 Essential (primary) hypertension: Secondary | ICD-10-CM | POA: Insufficient documentation

## 2023-01-02 DIAGNOSIS — I6523 Occlusion and stenosis of bilateral carotid arteries: Secondary | ICD-10-CM | POA: Diagnosis not present

## 2023-01-21 ENCOUNTER — Ambulatory Visit (INDEPENDENT_AMBULATORY_CARE_PROVIDER_SITE_OTHER): Payer: Medicaid Other | Admitting: Family Medicine

## 2023-01-21 ENCOUNTER — Other Ambulatory Visit: Payer: Self-pay | Admitting: Family Medicine

## 2023-01-21 ENCOUNTER — Encounter: Payer: Self-pay | Admitting: Family Medicine

## 2023-01-21 ENCOUNTER — Other Ambulatory Visit: Payer: Self-pay | Admitting: Cardiovascular Disease

## 2023-01-21 VITALS — BP 130/90 | HR 76 | Temp 98.2°F | Resp 18 | Ht 67.0 in | Wt 198.8 lb

## 2023-01-21 DIAGNOSIS — R21 Rash and other nonspecific skin eruption: Secondary | ICD-10-CM | POA: Insufficient documentation

## 2023-01-21 DIAGNOSIS — I25118 Atherosclerotic heart disease of native coronary artery with other forms of angina pectoris: Secondary | ICD-10-CM

## 2023-01-21 DIAGNOSIS — R232 Flushing: Secondary | ICD-10-CM | POA: Diagnosis not present

## 2023-01-21 DIAGNOSIS — R1013 Epigastric pain: Secondary | ICD-10-CM | POA: Diagnosis not present

## 2023-01-21 MED ORDER — OMEPRAZOLE 20 MG PO CPDR: 20.0000 mg | DELAYED_RELEASE_CAPSULE | Freq: Every day | ORAL | 3 refills | Status: DC

## 2023-01-21 MED ORDER — VEOZAH 45 MG PO TABS: 1.0000 | ORAL_TABLET | Freq: Every day | ORAL | 5 refills | Status: DC

## 2023-01-21 MED ORDER — TRIAMCINOLONE ACETONIDE 0.1 % EX CREA: 1.0000 | TOPICAL_CREAM | Freq: Two times a day (BID) | CUTANEOUS | 0 refills | Status: DC

## 2023-01-21 NOTE — Assessment & Plan Note (Signed)
Pt has not had ca score done yet -- she missed her appointment Will re order and get scheduled

## 2023-01-21 NOTE — Assessment & Plan Note (Signed)
Veozah daily

## 2023-01-21 NOTE — Progress Notes (Addendum)
Subjective:   By signing my name below, I, Carlena Bjornstad, attest that this documentation has been prepared under the direction and in the presence of Seabron Spates R, DO. 01/21/2023.   Patient ID: Melody Gardner, female    DOB: [birth year], 57 y.o.   MRN: [id number]  Chief Complaint  Patient presents with   Rash    Pt states noticing a rash on arms and legs, x1 month, some itching. Pt thinks it may be heat rash.   Neck Pain    Pt states having some upper back pain and states no trouble turning head.     HPI Patient is in today for an office visit.  She complains of a rash that she is concerned is an unknown allergic reaction. About one month ago she initially developed red "bumps" on her bilateral forearms, thighs, upper chest, and dorsal neck/upper back. For a while, her neck appeared erythematous and felt stiff. Today her rash seems to be gradually improving as it is mostly localized to her bilateral forearms. She did take benadryl which helped. She denies being outside very much in the past few months. Recently she was nearby 2 small dogs; no known fleas.   Frequently she has shortness of breath at night while lying down. She believes this is related to her acid reflux which she treats with OTC medication. She denies any epigastric pain.  Additionally she has been feeling more fatigued. She has felt this is due to her work schedule. She also attributes some weight gain to the onset of menopause with hot flashes. Wt Readings from Last 3 Encounters:  01/21/23 198 lb 12.8 oz (90.2 kg)  10/23/22 190 lb 8 oz (86.4 kg)  05/16/21 174 lb (78.9 kg)    Past Medical History:  Diagnosis Date   Abnormal uterine bleeding 01/30/2012   Benign endometrial biopsy, showed endometrial polyps    Acute MI, lateral wall, initial episode of care ([facility]) 12/18/2015   Anemia 2013   Carotid artery disease ([facility]) 01/30/2019   Mild 1-39%-  by CTA 9/19   Coronary artery dissection 12/19/2015   SCAD March 2017-  normal LVF then 60-65%, no other CAD   Crohn's disease ([facility])    Crohn's disease of both small and large intestine without complication ([facility]) 12/14/2015   Crohn's disease with complication ([facility]) 06/17/2018   Depression    pt states she is depressed but not medically diagnosed   Endometrial polyp 01/31/2012   On endometrial biopsy done 01/30/12    Fibroids 01/30/2012   Hematochezia 03/05/2016   Hypertension 2012   Hypertensive heart disease 12/19/2015   Plantar fasciitis 01/02/2019   S/P hydrothermal endometrial ablation on 03/31/2012 03/31/2012    Past Surgical History:  Procedure Laterality Date   CARDIAC CATHETERIZATION N/A 12/18/2015   Procedure: Left Heart Cath and Coronary Angiography;  Surgeon: Tonny Bollman, MD;  Location: [facility];  Service: Cardiovascular;  Laterality: N/A;   ENDOMETRIAL ABLATION  2013   LAPAROSCOPY FOR ECTOPIC PREGNANCY     RENAL ARTERY STENT     TUBAL LIGATION  1989    Family History  Problem Relation Age of Onset   Hypertension Mother    Cancer Brother    Diabetes Brother    Diabetes Father    Stroke Father    Neuropathy Father    Hyperlipidemia Father    Neuropathy Brother    Thyroid disease Neg Hx     Social History   Socioeconomic History   Marital  status: Legally Separated    Spouse name: Not on file   Number of children: Not on file   Years of education: Not on file   Highest education level: Not on file  Occupational History    Comment: [employer]  Tobacco Use   Smoking status: Never   Smokeless tobacco: Never  Substance and Sexual Activity   Alcohol use: No   Drug use: No   Sexual activity: Yes    Birth control/protection: Surgical  Other Topics Concern   Not on file  Social History Narrative   Not on file   Social Determinants of Health   Financial Resource Strain: Low Risk  (10/24/2022)   Overall Financial Resource Strain (CARDIA)    Difficulty of Paying Living Expenses: Not very hard  Food Insecurity: No Food  Insecurity (10/24/2022)   Hunger Vital Sign    Worried About Running Out of Food in the Last Year: Never true    Ran Out of Food in the Last Year: Never true  Transportation Needs: No Transportation Needs (10/24/2022)   PRAPARE - Administrator, Civil Service (Medical): No    Lack of Transportation (Non-Medical): No  Physical Activity: Not on file  Stress: Not on file  Social Connections: Not on file  Intimate Partner Violence: Not on file    Outpatient Medications Prior to Visit  Medication Sig Dispense Refill   amLODipine (NORVASC) 10 MG tablet TAKE 1 TABLET BY MOUTH EVERY DAY 180 tablet 3   aspirin EC 81 MG tablet Take 1 tablet (81 mg total) by mouth daily. 30 tablet 12   fluticasone (FLONASE) 50 MCG/ACT nasal spray Place 1 spray into both nostrils daily. 5 g 0   losartan-hydrochlorothiazide (HYZAAR) 100-12.5 MG tablet Take 1 tablet by mouth daily. MUST KEEP DECEMBER APPOINTMENT 90 tablet 1   metoprolol tartrate (LOPRESSOR) 25 MG tablet TAKE 1 TABLET(25 MG) BY MOUTH TWICE DAILY 180 tablet 3   Multiple Vitamins-Minerals (WOMENS ONE DAILY PO) Take 1 tablet by mouth daily.     nitroGLYCERIN (NITROSTAT) 0.4 MG SL tablet Place 1 tablet (0.4 mg total) under the tongue every 5 (five) minutes as needed for chest pain. 25 tablet PRN   sodium chloride (OCEAN) 0.65 % SOLN nasal spray Place 1 spray into both nostrils as needed for congestion. 15 mL 0   No facility-administered medications prior to visit.    Allergies  Allergen Reactions   Folic Acid Rash    Pt declines this allergy   Sulfa Antibiotics Rash   Sulfasalazine Itching and Rash    Review of Systems  Constitutional:  Positive for malaise/fatigue. Negative for fever.       +Hot flashes  HENT:  Negative for congestion.   Eyes:  Negative for blurred vision.  Respiratory:  Negative for cough.   Cardiovascular:  Positive for orthopnea. Negative for chest pain, palpitations and leg swelling.  Gastrointestinal:  Positive  for heartburn (chronic). Negative for abdominal pain, blood in stool and nausea.  Genitourinary:  Negative for dysuria and frequency.  Musculoskeletal:  Negative for falls.  Skin:  Positive for rash (Bilateral forearms).  Neurological:  Negative for dizziness, loss of consciousness and headaches.  Endo/Heme/Allergies:  Negative for environmental allergies.  Psychiatric/Behavioral:  Negative for depression. The patient is not nervous/anxious.        Objective:    Physical Exam Constitutional:      Appearance: Normal appearance.  HENT:     Head: Normocephalic and atraumatic.  Right Ear: Tympanic membrane, ear canal and external ear normal.     Left Ear: Tympanic membrane, ear canal and external ear normal.  Eyes:     Extraocular Movements: Extraocular movements intact.     Pupils: Pupils are equal, round, and reactive to light.  Cardiovascular:     Rate and Rhythm: Normal rate and regular rhythm.     Heart sounds: Normal heart sounds. No murmur heard.    No gallop.  Pulmonary:     Effort: Pulmonary effort is normal. No respiratory distress.     Breath sounds: Normal breath sounds. No wheezing or rales.  Skin:    General: Skin is warm and dry.     Comments: Diffuse, scattered red papules of bilateral forearms.  Neurological:     General: No focal deficit present.     Mental Status: She is alert and oriented to person, place, and time.  Psychiatric:        Mood and Affect: Mood normal.        Behavior: Behavior normal.     BP (!) 130/90 (BP Location: Left Arm, Patient Position: Sitting, Cuff Size: Normal)   Pulse 76   Temp 98.2 F (36.8 C) (Oral)   Resp 18   Ht  (1.702 m)   Wt 198 lb 12.8 oz (90.2 kg)   SpO2 94%   BMI 31.14 kg/m  Wt Readings from Last 3 Encounters:  01/21/23 198 lb 12.8 oz (90.2 kg)  10/23/22 190 lb 8 oz (86.4 kg)  05/16/21 174 lb (78.9 kg)    Diabetic Foot Exam - Simple   No data filed    Lab Results  Component Value Date   WBC 7.2  05/16/2021   HGB 15.0 05/16/2021   HCT 45.1 05/16/2021   PLT 363 05/16/2021   GLUCOSE 98 05/16/2021   CHOL 110 09/11/2019   TRIG 81 09/11/2019   HDL 34 (L) 09/11/2019   LDLCALC 60 09/11/2019   ALT 12 09/11/2019   AST 19 09/11/2019   NA 137 05/16/2021   K 3.4 (L) 05/16/2021   CL 102 05/16/2021   CREATININE 0.98 05/16/2021   BUN 13 05/16/2021   CO2 28 05/16/2021   TSH 1.27 09/11/2019    Lab Results  Component Value Date   TSH 1.27 09/11/2019   Lab Results  Component Value Date   WBC 7.2 05/16/2021   HGB 15.0 05/16/2021   HCT 45.1 05/16/2021   MCV 90.0 05/16/2021   PLT 363 05/16/2021   Lab Results  Component Value Date   NA 137 05/16/2021   K 3.4 (L) 05/16/2021   CO2 28 05/16/2021   GLUCOSE 98 05/16/2021   BUN 13 05/16/2021   CREATININE 0.98 05/16/2021   BILITOT 0.9 09/11/2019   ALKPHOS 60 07/30/2019   AST 19 09/11/2019   ALT 12 09/11/2019   PROT 7.2 09/11/2019   ALBUMIN 4.0 07/30/2019   CALCIUM 9.8 05/16/2021   ANIONGAP 7 05/16/2021   GFR 102.61 06/17/2018   Lab Results  Component Value Date   CHOL 110 09/11/2019   Lab Results  Component Value Date   HDL 34 (L) 09/11/2019   Lab Results  Component Value Date   LDLCALC 60 09/11/2019   Lab Results  Component Value Date   TRIG 81 09/11/2019   Lab Results  Component Value Date   CHOLHDL 3.2 09/11/2019   No results found for: "HGBA1C"     Assessment & Plan:   Problem List Items Addressed This  Visit       Unprioritized   Rash - Primary    Triamcinolone cream  Benadryl as needed       Relevant Medications   triamcinolone cream (KENALOG) 0.1 %   Hot flashes    Veozah daily      Relevant Medications   Fezolinetant (VEOZAH) 45 MG TABS   Coronary artery disease of native artery of native heart with stable angina pectoris    Pt has not had ca score done yet -- she missed her appointment Will re order and get scheduled       Relevant Orders   CT MODIFIED CAL SCORE   Other Visit  Diagnoses     Dyspepsia       Relevant Medications   omeprazole (PRILOSEC) 20 MG capsule        Meds ordered this encounter  Medications   triamcinolone cream (KENALOG) 0.1 %    Sig: Apply 1 Application topically 2 (two) times daily.    Dispense:  30 g    Refill:  0   Fezolinetant (VEOZAH) 45 MG TABS    Sig: Take 1 tablet (45 mg total) by mouth daily.    Dispense:  30 tablet    Refill:  5   omeprazole (PRILOSEC) 20 MG capsule    Sig: Take 1 capsule (20 mg total) by mouth daily.    Dispense:  30 capsule    Refill:  3    I, Donato Schultz, DO, personally preformed the services described in this documentation.  All medical record entries made by the scribe were at my direction and in my presence.  I have reviewed the chart and discharge instructions (if applicable) and agree that the record reflects my personal performance and is accurate and complete. 01/21/2023.  I,Mathew Stumpf,acting as a Neurosurgeon for [employer], DO.,have documented all relevant documentation on the behalf of Donato Schultz, DO,as directed by  Donato Schultz, DO while in the presence of Donato Schultz, DO.   Donato Schultz, DO

## 2023-01-21 NOTE — Assessment & Plan Note (Signed)
Triamcinolone cream  Benadryl as needed

## 2023-01-22 NOTE — Telephone Encounter (Signed)
Rx(s) sent to pharmacy electronically.  

## 2023-01-24 ENCOUNTER — Telehealth: Payer: Self-pay | Admitting: *Deleted

## 2023-01-24 NOTE — Telephone Encounter (Signed)
Contacted regarding PREP Class referral. Left voice message to return call for more information. 

## 2023-01-30 DIAGNOSIS — Z419 Encounter for procedure for purposes other than remedying health state, unspecified: Secondary | ICD-10-CM | POA: Diagnosis not present

## 2023-02-09 ENCOUNTER — Other Ambulatory Visit: Payer: Self-pay | Admitting: Cardiovascular Disease

## 2023-02-11 NOTE — Telephone Encounter (Signed)
Rx request sent to pharmacy.  

## 2023-02-13 ENCOUNTER — Encounter (HOSPITAL_BASED_OUTPATIENT_CLINIC_OR_DEPARTMENT_OTHER): Payer: Self-pay | Admitting: *Deleted

## 2023-02-26 ENCOUNTER — Encounter: Payer: Medicaid Other | Admitting: Family Medicine

## 2023-02-28 ENCOUNTER — Encounter: Payer: Medicaid Other | Admitting: Family Medicine

## 2023-03-02 DIAGNOSIS — Z419 Encounter for procedure for purposes other than remedying health state, unspecified: Secondary | ICD-10-CM | POA: Diagnosis not present

## 2023-03-04 ENCOUNTER — Encounter: Payer: 59 | Admitting: Family Medicine

## 2023-03-04 ENCOUNTER — Ambulatory Visit (INDEPENDENT_AMBULATORY_CARE_PROVIDER_SITE_OTHER): Payer: 59 | Admitting: Family Medicine

## 2023-03-04 ENCOUNTER — Other Ambulatory Visit (HOSPITAL_COMMUNITY)
Admission: RE | Admit: 2023-03-04 | Discharge: 2023-03-04 | Disposition: A | Discharge status: Home / Self Care | Payer: 59 | Source: Ambulatory Visit | Attending: Family Medicine | Admitting: Family Medicine

## 2023-03-04 ENCOUNTER — Encounter: Payer: Self-pay | Admitting: Family Medicine

## 2023-03-04 VITALS — BP 124/88 | HR 99 | Ht 67.0 in | Wt 202.8 lb

## 2023-03-04 DIAGNOSIS — Z0001 Encounter for general adult medical examination with abnormal findings: Secondary | ICD-10-CM

## 2023-03-04 DIAGNOSIS — Z0000 Encounter for general adult medical examination without abnormal findings: Secondary | ICD-10-CM

## 2023-03-04 DIAGNOSIS — R232 Flushing: Secondary | ICD-10-CM | POA: Diagnosis not present

## 2023-03-04 DIAGNOSIS — Z23 Encounter for immunization: Secondary | ICD-10-CM | POA: Diagnosis not present

## 2023-03-04 DIAGNOSIS — M79661 Pain in right lower leg: Secondary | ICD-10-CM | POA: Insufficient documentation

## 2023-03-04 DIAGNOSIS — I10 Essential (primary) hypertension: Secondary | ICD-10-CM

## 2023-03-04 DIAGNOSIS — Z1159 Encounter for screening for other viral diseases: Secondary | ICD-10-CM

## 2023-03-04 DIAGNOSIS — Z1211 Encounter for screening for malignant neoplasm of colon: Secondary | ICD-10-CM

## 2023-03-04 LAB — HEMOCCULT GUIAC POC 1CARD (OFFICE): Fecal Occult Blood, POC: NEGATIVE

## 2023-03-04 MED ORDER — VEOZAH 45 MG PO TABS
1.0000 | ORAL_TABLET | Freq: Every day | ORAL | 0 refills | Status: AC
Start: 2023-03-04 — End: ?

## 2023-03-04 NOTE — Assessment & Plan Note (Signed)
Well controlled, no changes to meds. Encouraged heart healthy diet such as the DASH diet and exercise as tolerated.  °

## 2023-03-04 NOTE — Progress Notes (Signed)
Established Patient Office Visit  Subjective   Patient ID: Melody Gardner, female    DOB: 1965-11-13  Age: 57 y.o. MRN: [id number]  Chief Complaint  Patient presents with   Annual Exam    Pap  Pt would like another alternative for a hot flash med    HPI Pt here for cpe.  She also c/o R calf pain and swelling -- no injury Patient Active Problem List   Diagnosis Date Noted   Preventative health care 03/04/2023   Right calf pain 03/04/2023   Need for hepatitis C screening test 03/04/2023   Need for shingles vaccine 03/04/2023   Rash 01/21/2023   Hot flashes 01/21/2023   Coronary artery disease of native artery of native heart with stable angina pectoris (HCC) 01/21/2023   COVID-19 virus infection 02/14/2021   Renal artery stenosis (HCC) 01/30/2019   Carotid artery disease (HCC) 01/30/2019   Plantar fasciitis 01/02/2019   Crohn's disease with complication (HCC) 06/17/2018   Abnormal findings on imaging test 04/01/2018   Neck nodule 05/17/2016   Abnormal liver function test 05/16/2016   Heartburn 03/05/2016   Hematochezia 03/05/2016   Hypertensive heart disease 12/19/2015   Coronary artery dissection 12/19/2015   Acute MI, lateral wall, initial episode of care (HCC) 12/18/2015   Crohn's disease of both small and large intestine without complication (HCC) 12/14/2015   HTN (hypertension) 01/07/2015   S/P hydrothermal endometrial ablation on 03/31/2012 03/31/2012   Endometrial polyp 01/31/2012   Fibroids 01/30/2012   Abnormal uterine bleeding 01/30/2012   Past Medical History:  Diagnosis Date   Abnormal uterine bleeding 01/30/2012   Benign endometrial biopsy, showed endometrial polyps    Acute MI, lateral wall, initial episode of care (HCC) 12/18/2015   Anemia 2013   Carotid artery disease (HCC) 01/30/2019   Mild 1-39%-  by CTA 9/19   Coronary artery dissection 12/19/2015   SCAD March 2017- normal LVF then 60-65%, no other CAD   Crohn's disease (HCC)    Crohn's disease of both  small and large intestine without complication (HCC) 12/14/2015   Crohn's disease with complication (HCC) 06/17/2018   Depression    pt states she is depressed but not medically diagnosed   Endometrial polyp 01/31/2012   On endometrial biopsy done 01/30/12    Fibroids 01/30/2012   Hematochezia 03/05/2016   Hypertension 2012   Hypertensive heart disease 12/19/2015   Plantar fasciitis 01/02/2019   S/P hydrothermal endometrial ablation on 03/31/2012 03/31/2012   Past Surgical History:  Procedure Laterality Date   CARDIAC CATHETERIZATION N/A 12/18/2015   Procedure: Left Heart Cath and Coronary Angiography;  Surgeon: Tonny Bollman, MD;  Location: [facility] INVASIVE CV LAB;  Service: Cardiovascular;  Laterality: N/A;   ENDOMETRIAL ABLATION  2013   LAPAROSCOPY FOR ECTOPIC PREGNANCY     RENAL ARTERY STENT     TUBAL LIGATION  1989   Social History   Tobacco Use   Smoking status: Never   Smokeless tobacco: Never  Substance Use Topics   Alcohol use: No   Drug use: No   Social History   Socioeconomic History   Marital status: Legally Separated    Spouse name: Not on file   Number of children: Not on file   Years of education: Not on file   Highest education level: Not on file  Occupational History    Comment: [employer]  Tobacco Use   Smoking status: Never   Smokeless tobacco: Never  Substance and Sexual Activity   Alcohol use:  No   Drug use: No   Sexual activity: Yes    Birth control/protection: Surgical  Other Topics Concern   Not on file  Social History Narrative   Exercise ---  weekly    Social Determinants of Health   Financial Resource Strain: Low Risk  (10/24/2022)   Overall Financial Resource Strain (CARDIA)    Difficulty of Paying Living Expenses: Not very hard  Food Insecurity: No Food Insecurity (10/24/2022)   Hunger Vital Sign    Worried About Running Out of Food in the Last Year: Never true    Ran Out of Food in the Last Year: Never true  Transportation Needs: No  Transportation Needs (10/24/2022)   PRAPARE - Administrator, Civil Service (Medical): No    Lack of Transportation (Non-Medical): No  Physical Activity: Not on file  Stress: Not on file  Social Connections: Not on file  Intimate Partner Violence: Not on file   Family Status  Relation Name Status   Mother  Alive   Brother  Deceased at age 16       unsure cause   Father  Deceased at age 57       stroke,dm    Daughter  Alive   Son  Audiological scientist  (Not Specified)   Neg Hx  (Not Specified)   Family History  Problem Relation Age of Onset   Hypertension Mother    Cancer Brother    Diabetes Brother    Diabetes Father    Stroke Father    Neuropathy Father    Hyperlipidemia Father    Neuropathy Brother    Thyroid disease Neg Hx    Allergies  Allergen Reactions   Folic Acid Rash    Pt declines this allergy   Sulfa Antibiotics Rash   Sulfasalazine Itching and Rash      ROS    Objective:     BP 124/88 (BP Location: Left Arm, Patient Position: Sitting, Cuff Size: Large)   Pulse 99   Ht 5\' 7"  (1.702 m)   Wt 202 lb 12.8 oz (92 kg)   SpO2 99%   BMI 31.76 kg/m  BP Readings from Last 3 Encounters:  03/04/23 124/88  01/21/23 (!) 130/90  10/23/22 112/81   Wt Readings from Last 3 Encounters:  03/04/23 202 lb 12.8 oz (92 kg)  01/21/23 198 lb 12.8 oz (90.2 kg)  10/23/22 190 lb 8 oz (86.4 kg)   SpO2 Readings from Last 3 Encounters:  03/04/23 99%  01/21/23 94%  05/16/21 97%    Physical Exam Vitals and nursing note reviewed.  Constitutional:      General: She is not in acute distress.    Appearance: She is well-developed.  HENT:     Head: Normocephalic and atraumatic.     Right Ear: External ear normal.     Left Ear: External ear normal.     Nose: Nose normal.  Eyes:     Conjunctiva/sclera: Conjunctivae normal.     Pupils: Pupils are equal, round, and reactive to light.  Neck:     Thyroid: No thyromegaly.     Vascular: No carotid bruit or JVD.   Cardiovascular:     Rate and Rhythm: Normal rate and regular rhythm.     Heart sounds: Normal heart sounds. No murmur heard. Pulmonary:     Effort: Pulmonary effort is normal. No respiratory distress.     Breath sounds: Normal breath sounds. No wheezing or rales.  Chest:  Chest wall: No tenderness.  Musculoskeletal:        General: Tenderness present. Normal range of motion.     Cervical back: Normal range of motion and neck supple.     Right lower leg: Tenderness present. 1+ Pitting Edema present.     Left lower leg: Normal.     Comments: R calf pain  Neurological:     Mental Status: She is alert and oriented to person, place, and time.  Psychiatric:        Behavior: Behavior normal.        Thought Content: Thought content normal.        Judgment: Judgment normal.     Results for orders placed or performed in visit on 03/04/23  POCT Occult Blood Stool  Result Value Ref Range   Fecal Occult Blood, POC Negative Negative   Card #1 Date 03/04/2023    Card #2 Fecal Occult Blod, POC     Card #2 Date     Card #3 Fecal Occult Blood, POC     Card #3 Date      Last CBC Lab Results  Component Value Date   WBC 7.2 05/16/2021   HGB 15.0 05/16/2021   HCT 45.1 05/16/2021   MCV 90.0 05/16/2021   MCH 29.9 05/16/2021   RDW 13.9 05/16/2021   PLT 363 05/16/2021   Last metabolic panel Lab Results  Component Value Date   GLUCOSE 98 05/16/2021   NA 137 05/16/2021   K 3.4 (L) 05/16/2021   CL 102 05/16/2021   CO2 28 05/16/2021   BUN 13 05/16/2021   CREATININE 0.98 05/16/2021   GFRNONAA >60 05/16/2021   CALCIUM 9.8 05/16/2021   PROT 7.2 09/11/2019   ALBUMIN 4.0 07/30/2019   LABGLOB 3.0 07/30/2019   AGRATIO 1.3 07/30/2019   BILITOT 0.9 09/11/2019   ALKPHOS 60 07/30/2019   AST 19 09/11/2019   ALT 12 09/11/2019   ANIONGAP 7 05/16/2021   Last lipids Lab Results  Component Value Date   CHOL 110 09/11/2019   HDL 34 (L) 09/11/2019   LDLCALC 60 09/11/2019   TRIG 81  09/11/2019   CHOLHDL 3.2 09/11/2019   Last hemoglobin A1c No results found for: "HGBA1C" Last thyroid functions Lab Results  Component Value Date   TSH 1.27 09/11/2019   Last vitamin D No results found for: "25OHVITD2", "25OHVITD3", "VD25OH" Last vitamin B12 and Folate No results found for: "VITAMINB12", "FOLATE"    The ASCVD Risk score (Arnett DK, et al., 2019) failed to calculate for the following reasons:   The patient has a prior MI or stroke diagnosis    Assessment & Plan:   Problem List Items Addressed This Visit       Unprioritized   Hot flashes   Relevant Medications   Fezolinetant (VEOZAH) 45 MG TABS   HTN (hypertension)    Well controlled, no changes to meds. Encouraged heart healthy diet such as the DASH diet and exercise as tolerated.        Need for hepatitis C screening test   Relevant Orders   Hepatitis C antibody   Need for shingles vaccine   Relevant Orders   Zoster Recombinant (Shingrix )   Preventative health care - Primary    Ghm utd Check labs  Health Maintenance  Topic Date Due   Hepatitis C Screening  Never done   Zoster Vaccines- Shingrix (1 of 2) Never done   MAMMOGRAM  06/25/2020   PAP SMEAR-Modifier  05/20/2022  COVID-19 Vaccine (3 - 2023-24 season) 06/01/2022   INFLUENZA VACCINE  05/02/2023   DTaP/Tdap/Td (2 - Td or Tdap) 01/07/2024   Colonoscopy  02/14/2030   HIV Screening  Completed   HPV VACCINES  Aged Out        Relevant Orders   CBC with Differential/Platelet   Comprehensive metabolic panel   Lipid panel   TSH   Cytology - PAP( )   POCT Occult Blood Stool (Completed)   Right calf pain    Check venous doppler       Relevant Orders   US Venous Img Upper Uni Right(DVT)    Return if symptoms worsen or fail to improve.    Donato Schultz, DO

## 2023-03-04 NOTE — Patient Instructions (Signed)
Preventive Care 40-57 Years Old, Female Preventive care refers to lifestyle choices and visits with your health care provider that can promote health and wellness. Preventive care visits are also called wellness exams. What can I expect for my preventive care visit? Counseling Your health care provider may ask you questions about your: Medical history, including: Past medical problems. Family medical history. Pregnancy history. Current health, including: Menstrual cycle. Method of birth control. Emotional well-being. Home life and relationship well-being. Sexual activity and sexual health. Lifestyle, including: Alcohol, nicotine or tobacco, and drug use. Access to firearms. Diet, exercise, and sleep habits. Work and work environment. Sunscreen use. Safety issues such as seatbelt and bike helmet use. Physical exam Your health care provider will check your: Height and weight. These may be used to calculate your BMI (body mass index). BMI is a measurement that tells if you are at a healthy weight. Waist circumference. This measures the distance around your waistline. This measurement also tells if you are at a healthy weight and may help predict your risk of certain diseases, such as type 2 diabetes and high blood pressure. Heart rate and blood pressure. Body temperature. Skin for abnormal spots. What immunizations do I need?  Vaccines are usually given at various ages, according to a schedule. Your health care provider will recommend vaccines for you based on your age, medical history, and lifestyle or other factors, such as travel or where you work. What tests do I need? Screening Your health care provider may recommend screening tests for certain conditions. This may include: Lipid and cholesterol levels. Diabetes screening. This is done by checking your blood sugar (glucose) after you have not eaten for a while (fasting). Pelvic exam and Pap test. Hepatitis B test. Hepatitis C  test. HIV (human immunodeficiency virus) test. STI (sexually transmitted infection) testing, if you are at risk. Lung cancer screening. Colorectal cancer screening. Mammogram. Talk with your health care provider about when you should start having regular mammograms. This may depend on whether you have a family history of breast cancer. BRCA-related cancer screening. This may be done if you have a family history of breast, ovarian, tubal, or peritoneal cancers. Bone density scan. This is done to screen for osteoporosis. Talk with your health care provider about your test results, treatment options, and if necessary, the need for more tests. Follow these instructions at home: Eating and drinking  Eat a diet that includes fresh fruits and vegetables, whole grains, lean protein, and low-fat dairy products. Take vitamin and mineral supplements as recommended by your health care provider. Do not drink alcohol if: Your health care provider tells you not to drink. You are pregnant, may be pregnant, or are planning to become pregnant. If you drink alcohol: Limit how much you have to 0-1 drink a day. Know how much alcohol is in your drink. In the [place] one drink equals one 12 oz bottle of beer (355 mL), one 5 oz glass of wine (148 mL), or one 1 oz glass of hard liquor (44 mL). Lifestyle Brush your teeth every morning and night with fluoride toothpaste. Floss one time each day. Exercise for at least 30 minutes 5 or more days each week. Do not use any products that contain nicotine or tobacco. These products include cigarettes, chewing tobacco, and vaping devices, such as e-cigarettes. If you need help quitting, ask your health care provider. Do not use drugs. If you are sexually active, practice safe sex. Use a condom or other form of protection to   prevent STIs. If you do not wish to become pregnant, use a form of birth control. If you plan to become pregnant, see your health care provider for a  prepregnancy visit. Take aspirin only as told by your health care provider. Make sure that you understand how much to take and what form to take. Work with your health care provider to find out whether it is safe and beneficial for you to take aspirin daily. Find healthy ways to manage stress, such as: Meditation, yoga, or listening to music. Journaling. Talking to a trusted person. Spending time with friends and family. Minimize exposure to UV radiation to reduce your risk of skin cancer. Safety Always wear your seat belt while driving or riding in a vehicle. Do not drive: If you have been drinking alcohol. Do not ride with someone who has been drinking. When you are tired or distracted. While texting. If you have been using any mind-altering substances or drugs. Wear a helmet and other protective equipment during sports activities. If you have firearms in your house, make sure you follow all gun safety procedures. Seek help if you have been physically or sexually abused. What's next? Visit your health care provider once a year for an annual wellness visit. Ask your health care provider how often you should have your eyes and teeth checked. Stay up to date on all vaccines. This information is not intended to replace advice given to you by your health care provider. Make sure you discuss any questions you have with your health care provider. Document Revised: 03/15/2021 Document Reviewed: 03/15/2021 Elsevier Patient Education  2024 Elsevier Inc.  

## 2023-03-04 NOTE — Assessment & Plan Note (Signed)
Ghm utd Check labs  Health Maintenance  Topic Date Due   Hepatitis C Screening  Never done   Zoster Vaccines- Shingrix (1 of 2) Never done   MAMMOGRAM  06/25/2020   PAP SMEAR-Modifier  05/20/2022   COVID-19 Vaccine (3 - 2023-24 season) 06/01/2022   INFLUENZA VACCINE  05/02/2023   DTaP/Tdap/Td (2 - Td or Tdap) 01/07/2024   Colonoscopy  02/14/2030   HIV Screening  Completed   HPV VACCINES  Aged Out

## 2023-03-04 NOTE — Assessment & Plan Note (Signed)
Check venous doppler

## 2023-03-05 ENCOUNTER — Ambulatory Visit (HOSPITAL_BASED_OUTPATIENT_CLINIC_OR_DEPARTMENT_OTHER)
Admission: RE | Admit: 2023-03-05 | Discharge: 2023-03-05 | Disposition: A | Discharge status: Home / Self Care | Payer: 59 | Source: Ambulatory Visit | Attending: Family Medicine | Admitting: Family Medicine

## 2023-03-05 DIAGNOSIS — M7989 Other specified soft tissue disorders: Secondary | ICD-10-CM | POA: Diagnosis not present

## 2023-03-05 DIAGNOSIS — M79661 Pain in right lower leg: Secondary | ICD-10-CM | POA: Diagnosis not present

## 2023-03-05 LAB — CBC WITH DIFFERENTIAL/PLATELET
Basophils Absolute: 0.1 10*3/uL (ref 0.0–0.1)
Basophils Relative: 0.9 % (ref 0.0–3.0)
Eosinophils Absolute: 0.2 10*3/uL (ref 0.0–0.7)
Eosinophils Relative: 2.5 % (ref 0.0–5.0)
HCT: 38.7 % (ref 36.0–46.0)
Hemoglobin: 12.4 g/dL (ref 12.0–15.0)
Lymphocytes Relative: 36.7 % (ref 12.0–46.0)
Lymphs Abs: 2.6 10*3/uL (ref 0.7–4.0)
MCHC: 32.2 g/dL (ref 30.0–36.0)
MCV: 88.9 fl (ref 78.0–100.0)
Monocytes Absolute: 0.5 10*3/uL (ref 0.1–1.0)
Monocytes Relative: 7.4 % (ref 3.0–12.0)
Neutro Abs: 3.7 10*3/uL (ref 1.4–7.7)
Neutrophils Relative %: 52.5 % (ref 43.0–77.0)
Platelets: 355 10*3/uL (ref 150.0–400.0)
RBC: 4.35 Mil/uL (ref 3.87–5.11)
RDW: 13.6 % (ref 11.5–15.5)
WBC: 7.1 10*3/uL (ref 4.0–10.5)

## 2023-03-05 LAB — COMPREHENSIVE METABOLIC PANEL
ALT: 27 U/L (ref 0–35)
AST: 25 U/L (ref 0–37)
Albumin: 4.3 g/dL (ref 3.5–5.2)
Alkaline Phosphatase: 86 U/L (ref 39–117)
BUN: 15 mg/dL (ref 6–23)
CO2: 29 mEq/L (ref 19–32)
Calcium: 10 mg/dL (ref 8.4–10.5)
Chloride: 104 mEq/L (ref 96–112)
Creatinine, Ser: 0.86 mg/dL (ref 0.40–1.20)
GFR: 75.15 mL/min (ref >60.00)
Glucose, Bld: 86 mg/dL (ref 70–99)
Potassium: 3.9 mEq/L (ref 3.5–5.1)
Sodium: 142 mEq/L (ref 135–145)
Total Bilirubin: 0.6 mg/dL (ref 0.2–1.2)
Total Protein: 7.8 g/dL (ref 6.0–8.3)

## 2023-03-05 LAB — LIPID PANEL
Cholesterol: 146 mg/dL (ref 0–200)
HDL: 49.7 mg/dL (ref >39.00)
LDL Cholesterol: 81 mg/dL (ref 0–99)
NonHDL: 96.07
Total CHOL/HDL Ratio: 3
Triglycerides: 76 mg/dL (ref 0.0–149.0)
VLDL: 15.2 mg/dL (ref 0.0–40.0)

## 2023-03-05 LAB — TSH: TSH: 1.17 u[IU]/mL (ref 0.35–5.50)

## 2023-03-05 LAB — HEPATITIS C ANTIBODY: Hepatitis C Ab: NONREACTIVE

## 2023-03-08 LAB — CYTOLOGY - PAP: Diagnosis: NEGATIVE

## 2023-03-19 DIAGNOSIS — I10 Essential (primary) hypertension: Secondary | ICD-10-CM | POA: Diagnosis not present

## 2023-03-19 DIAGNOSIS — Z1159 Encounter for screening for other viral diseases: Secondary | ICD-10-CM | POA: Diagnosis not present

## 2023-03-19 DIAGNOSIS — M79661 Pain in right lower leg: Secondary | ICD-10-CM | POA: Diagnosis not present

## 2023-03-19 DIAGNOSIS — Z1211 Encounter for screening for malignant neoplasm of colon: Secondary | ICD-10-CM | POA: Diagnosis not present

## 2023-03-19 DIAGNOSIS — Z23 Encounter for immunization: Secondary | ICD-10-CM | POA: Diagnosis not present

## 2023-03-19 DIAGNOSIS — Z0001 Encounter for general adult medical examination with abnormal findings: Secondary | ICD-10-CM | POA: Diagnosis not present

## 2023-03-19 DIAGNOSIS — R232 Flushing: Secondary | ICD-10-CM | POA: Diagnosis not present

## 2023-04-01 DIAGNOSIS — Z419 Encounter for procedure for purposes other than remedying health state, unspecified: Secondary | ICD-10-CM | POA: Diagnosis not present

## 2023-04-08 ENCOUNTER — Telehealth: Payer: Self-pay

## 2023-04-08 NOTE — Telephone Encounter (Signed)
Chart review completed for patient. Patient is due for screening mammogram. Mychart message sent to patient to inquire about scheduling mammogram.  Quinne Pires, Population Health Specialist.  

## 2023-05-02 DIAGNOSIS — Z419 Encounter for procedure for purposes other than remedying health state, unspecified: Secondary | ICD-10-CM | POA: Diagnosis not present

## 2023-05-30 NOTE — Progress Notes (Deleted)
Established patient visit   Patient: Melody Gardner   DOB: [birth year]   57 y.o. Female  MRN: [id number] Visit Date: 05/31/2023  Today's healthcare provider: Alfredia Ferguson, PA-C   No chief complaint on file.  Subjective    HPI  ***  Medications: Outpatient Medications Prior to Visit  Medication Sig   amLODipine (NORVASC) 10 MG tablet TAKE 1 TABLET BY MOUTH EVERY DAY   aspirin EC 81 MG tablet Take 1 tablet (81 mg total) by mouth daily.   Fezolinetant (VEOZAH) 45 MG TABS Take 1 tablet (45 mg total) by mouth daily. (Patient not taking: Reported on 03/04/2023)   Fezolinetant (VEOZAH) 45 MG TABS Take 1 tablet (45 mg total) by mouth daily.   fluticasone (FLONASE) 50 MCG/ACT nasal spray Place 1 spray into both nostrils daily.   losartan-hydrochlorothiazide (HYZAAR) 100-12.5 MG tablet TAKE 1 TABLET BY MOUTH DAILY   metoprolol tartrate (LOPRESSOR) 25 MG tablet TAKE 1 TABLET(25 MG) BY MOUTH TWICE DAILY   Multiple Vitamins-Minerals (WOMENS ONE DAILY PO) Take 1 tablet by mouth daily.   nitroGLYCERIN (NITROSTAT) 0.4 MG SL tablet Place 1 tablet (0.4 mg total) under the tongue every 5 (five) minutes as needed for chest pain.   omeprazole (PRILOSEC) 20 MG capsule Take 1 capsule (20 mg total) by mouth daily.   triamcinolone cream (KENALOG) 0.1 % Apply 1 Application topically 2 (two) times daily. (Patient not taking: Reported on 03/04/2023)   No facility-administered medications prior to visit.    Review of Systems {Insert previous labs (optional):23779} {See past labs  Heme  Chem  Endocrine  Serology  Results Review (optional):1}   Objective    There were no vitals taken for this visit. {Insert last BP/Wt (optional):23777}{See vitals history (optional):1}  Physical Exam  ***  No results found for any visits on 05/31/23.  Assessment & Plan     ***  No follow-ups on file.      {provider attestation***:1}   Alfredia Ferguson, PA-C  [facility] at [facility] [phone] (phone) [phone] (fax)  [facility]

## 2023-05-31 ENCOUNTER — Ambulatory Visit: Payer: 59 | Admitting: Physician Assistant

## 2023-06-02 DIAGNOSIS — Z419 Encounter for procedure for purposes other than remedying health state, unspecified: Secondary | ICD-10-CM | POA: Diagnosis not present

## 2023-06-13 ENCOUNTER — Ambulatory Visit (INDEPENDENT_AMBULATORY_CARE_PROVIDER_SITE_OTHER): Payer: 59

## 2023-06-13 DIAGNOSIS — Z23 Encounter for immunization: Secondary | ICD-10-CM

## 2023-06-24 ENCOUNTER — Other Ambulatory Visit: Payer: Self-pay | Admitting: Family Medicine

## 2023-06-24 DIAGNOSIS — R1013 Epigastric pain: Secondary | ICD-10-CM

## 2023-06-27 ENCOUNTER — Encounter: Payer: Self-pay | Admitting: Family Medicine

## 2023-07-02 DIAGNOSIS — Z419 Encounter for procedure for purposes other than remedying health state, unspecified: Secondary | ICD-10-CM | POA: Diagnosis not present

## 2023-07-22 ENCOUNTER — Other Ambulatory Visit (HOSPITAL_BASED_OUTPATIENT_CLINIC_OR_DEPARTMENT_OTHER): Payer: Self-pay | Admitting: Family Medicine

## 2023-07-22 DIAGNOSIS — Z139 Encounter for screening, unspecified: Secondary | ICD-10-CM

## 2023-07-25 ENCOUNTER — Ambulatory Visit (HOSPITAL_BASED_OUTPATIENT_CLINIC_OR_DEPARTMENT_OTHER)
Admission: RE | Admit: 2023-07-25 | Discharge: 2023-07-25 | Disposition: A | Discharge status: Home / Self Care | Payer: Self-pay | Source: Ambulatory Visit | Attending: Family Medicine | Admitting: Family Medicine

## 2023-07-25 ENCOUNTER — Encounter (HOSPITAL_BASED_OUTPATIENT_CLINIC_OR_DEPARTMENT_OTHER): Payer: Self-pay

## 2023-07-25 DIAGNOSIS — Z139 Encounter for screening, unspecified: Secondary | ICD-10-CM

## 2023-07-25 DIAGNOSIS — Z1231 Encounter for screening mammogram for malignant neoplasm of breast: Secondary | ICD-10-CM | POA: Diagnosis not present

## 2023-08-02 DIAGNOSIS — Z419 Encounter for procedure for purposes other than remedying health state, unspecified: Secondary | ICD-10-CM | POA: Diagnosis not present

## 2023-09-01 DIAGNOSIS — Z419 Encounter for procedure for purposes other than remedying health state, unspecified: Secondary | ICD-10-CM | POA: Diagnosis not present

## 2023-10-18 ENCOUNTER — Other Ambulatory Visit: Payer: Self-pay | Admitting: Family Medicine

## 2023-10-18 DIAGNOSIS — R1013 Epigastric pain: Secondary | ICD-10-CM

## 2023-10-29 ENCOUNTER — Ambulatory Visit: Payer: 59 | Admitting: Medical

## 2023-10-29 ENCOUNTER — Ambulatory Visit: Payer: Self-pay | Admitting: Family Medicine

## 2023-10-29 NOTE — Telephone Encounter (Signed)
  Chief Complaint: nausea/vomiting Symptoms: nausea/vomiting Frequency: more than 10 times  Disposition: [] ED /[] Urgent Care (no appt availability in office) / [x] Appointment(In office/virtual)/ []  [place] [facility]/ [] Home Care/ [] Refused Recommended Disposition /[] [place] [facility]/ []  Follow-up with PCP Additional Notes: Pt complaining of nausea vomiting that started yesterday. Pt is unsure of the times she vomited, but says it is more than 10 times since yesterday. Pt has a headache, stomach is sore, and pt feels very week. Per protocol, pt to be seen within 3 days. Pt has appt at 1540 today. RN gave care advice and pt verbalized understanding.           Copied from CRM [id number]. Topic: Clinical - Red Word Triage >> Oct 29, 2023  3:23 PM Denese Killings wrote: Red Word that prompted transfer to Nurse Triage: Last night patient was very naseous and threw up. This morning her stomach is sore, very fatigue with possible constiptation. Reason for Disposition  Nausea lasts > 1 week  Answer Assessment - Initial Assessment Questions 1. NAUSEA SEVERITY: "How bad is the nausea?" (e.g., mild, moderate, severe; dehydration, weight loss)   - MILD: loss of appetite without change in eating habits   - MODERATE: decreased oral intake without significant weight loss, dehydration, or malnutrition   - SEVERE: inadequate caloric or fluid intake, significant weight loss, symptoms of dehydration     moderate 2. ONSET: "When did the nausea begin?"     Yesterday  3. VOMITING: "Any vomiting?" If Yes, ask: "How many times today?"     More than 10 times- unsure of amount 4. RECURRENT SYMPTOM: "Have you had nausea before?" If Yes, ask: "When was the last time?" "What happened that time?"     'been a while"  5. CAUSE: "What do you think is causing the nausea?"     Unsure  Protocols used: Nausea-A-AH

## 2023-10-31 ENCOUNTER — Ambulatory Visit (INDEPENDENT_AMBULATORY_CARE_PROVIDER_SITE_OTHER): Payer: Self-pay | Admitting: Family Medicine

## 2023-10-31 ENCOUNTER — Encounter: Payer: Self-pay | Admitting: Family Medicine

## 2023-10-31 VITALS — BP 102/70 | HR 80 | Temp 98.0°F | Resp 18 | Ht 67.0 in | Wt 198.0 lb

## 2023-10-31 DIAGNOSIS — R112 Nausea with vomiting, unspecified: Secondary | ICD-10-CM

## 2023-10-31 DIAGNOSIS — R1010 Upper abdominal pain, unspecified: Secondary | ICD-10-CM

## 2023-10-31 LAB — POC URINALSYSI DIPSTICK (AUTOMATED)
Blood, UA: NEGATIVE
Glucose, UA: NEGATIVE
Leukocytes, UA: NEGATIVE
Nitrite, UA: NEGATIVE
Protein, UA: NEGATIVE
Spec Grav, UA: 1.02 (ref 1.010–1.025)
Urobilinogen, UA: 0.2 U/dL
pH, UA: 6 (ref 5.0–8.0)

## 2023-10-31 LAB — CBC WITH DIFFERENTIAL/PLATELET
Basophils Absolute: 0.1 10*3/uL (ref 0.0–0.1)
Basophils Relative: 1.4 % (ref 0.0–3.0)
Eosinophils Absolute: 0.1 10*3/uL (ref 0.0–0.7)
Eosinophils Relative: 2.2 % (ref 0.0–5.0)
HCT: 40 % (ref 36.0–46.0)
Hemoglobin: 13.1 g/dL (ref 12.0–15.0)
Lymphocytes Relative: 35.1 % (ref 12.0–46.0)
Lymphs Abs: 2 10*3/uL (ref 0.7–4.0)
MCHC: 32.8 g/dL (ref 30.0–36.0)
MCV: 87.2 fL (ref 78.0–100.0)
Monocytes Absolute: 0.4 10*3/uL (ref 0.1–1.0)
Monocytes Relative: 7.4 % (ref 3.0–12.0)
Neutro Abs: 3.1 10*3/uL (ref 1.4–7.7)
Neutrophils Relative %: 53.9 % (ref 43.0–77.0)
Platelets: 366 10*3/uL (ref 150.0–400.0)
RBC: 4.58 Mil/uL (ref 3.87–5.11)
RDW: 14.1 % (ref 11.5–15.5)
WBC: 5.7 10*3/uL (ref 4.0–10.5)

## 2023-10-31 LAB — COMPREHENSIVE METABOLIC PANEL
ALT: 24 U/L (ref 0–35)
AST: 21 U/L (ref 0–37)
Albumin: 4.3 g/dL (ref 3.5–5.2)
Alkaline Phosphatase: 95 U/L (ref 39–117)
BUN: 17 mg/dL (ref 6–23)
CO2: 29 meq/L (ref 19–32)
Calcium: 10.1 mg/dL (ref 8.4–10.5)
Chloride: 103 meq/L (ref 96–112)
Creatinine, Ser: 0.93 mg/dL (ref 0.40–1.20)
GFR: 68.1 mL/min (ref >60.00)
Glucose, Bld: 87 mg/dL (ref 70–99)
Potassium: 4.1 meq/L (ref 3.5–5.1)
Sodium: 142 meq/L (ref 135–145)
Total Bilirubin: 0.9 mg/dL (ref 0.2–1.2)
Total Protein: 7.5 g/dL (ref 6.0–8.3)

## 2023-10-31 LAB — AMYLASE: Amylase: 133 U/L — ABNORMAL HIGH (ref 27–131)

## 2023-10-31 LAB — POC INFLUENZA A&B (BINAX/QUICKVUE)
Influenza A, POC: NEGATIVE
Influenza B, POC: NEGATIVE

## 2023-10-31 LAB — POC COVID19 BINAXNOW: SARS Coronavirus 2 Ag: NEGATIVE

## 2023-10-31 LAB — LIPASE: Lipase: 36 U/L (ref 11.0–59.0)

## 2023-10-31 MED ORDER — ONDANSETRON HCL 4 MG PO TABS: 4.0000 mg | ORAL_TABLET | Freq: Three times a day (TID) | ORAL | 0 refills | Status: DC | PRN

## 2023-10-31 NOTE — Progress Notes (Signed)
Established Patient Office Visit  Subjective   Patient ID: Melody Gardner, female    DOB: 1966-08-28  Age: 58 y.o. MRN: [id number]  Chief Complaint  Patient presents with   Emesis    Sxs started Monday into Tuesday. Pt states now having abdominal pain. Pt states lack of appetite. No diarrhea. No cough    HPI Discussed the use of AI scribe software for clinical note transcription with the patient, who gave verbal consent to proceed.  History of Present Illness   She presents with nausea, vomiting, and abdominal pain.  She began experiencing nausea and vomiting on Tuesday morning, with multiple episodes leading to dehydration. She attempted to eat toast but had no appetite and did not experience diarrhea. She did not use Pedialyte or Gatorade during this time but has consumed Gatorade in the past.  She describes persistent abdominal soreness, particularly on the sides, which she attributes to vomiting. The pain is constant and worsens with pressure. She also experienced transient pain in the upper abdomen during the vomiting episode, which has since resolved.  She reports a fever on Tuesday morning, feeling extremely hot, but the fever has since resolved. She mentions that her client had been sick with coughing and diarrhea, but both tested negative for COVID. She had a flu shot in September or October.  She reports an altered taste sensation, noting that chicken noodle soup tasted 'so nasty' to her. She finds bland foods unappealing.  She has not been feeling nauseous today and managed to eat part of a chicken and cheese sandwich, although she could not finish it.      Patient Active Problem List   Diagnosis Date Noted   Preventative health care 03/04/2023   Right calf pain 03/04/2023   Need for hepatitis C screening test 03/04/2023   Need for shingles vaccine 03/04/2023   Rash 01/21/2023   Hot flashes 01/21/2023   Coronary artery disease of native artery of native heart with  stable angina pectoris (HCC) 01/21/2023   COVID-19 virus infection 02/14/2021   Renal artery stenosis (HCC) 01/30/2019   Carotid artery disease (HCC) 01/30/2019   Plantar fasciitis 01/02/2019   Crohn's disease with complication (HCC) 06/17/2018   Abnormal findings on imaging test 04/01/2018   Neck nodule 05/17/2016   Abnormal liver function test 05/16/2016   Heartburn 03/05/2016   Hematochezia 03/05/2016   Hypertensive heart disease 12/19/2015   Coronary artery dissection 12/19/2015   Acute MI, lateral wall, initial episode of care (HCC) 12/18/2015   Crohn's disease of both small and large intestine without complication (HCC) 12/14/2015   HTN (hypertension) 01/07/2015   S/P hydrothermal endometrial ablation on 03/31/2012 03/31/2012   Endometrial polyp 01/31/2012   Fibroids 01/30/2012   Abnormal uterine bleeding 01/30/2012   Past Medical History:  Diagnosis Date   Abnormal uterine bleeding 01/30/2012   Benign endometrial biopsy, showed endometrial polyps    Acute MI, lateral wall, initial episode of care (HCC) 12/18/2015   Anemia 2013   Carotid artery disease (HCC) 01/30/2019   Mild 1-39%-  by CTA 9/19   Coronary artery dissection 12/19/2015   SCAD March 2017- normal LVF then 60-65%, no other CAD   Crohn's disease (HCC)    Crohn's disease of both small and large intestine without complication (HCC) 12/14/2015   Crohn's disease with complication (HCC) 06/17/2018   Depression    pt states she is depressed but not medically diagnosed   Endometrial polyp 01/31/2012   On endometrial biopsy done 01/30/12  Fibroids 01/30/2012   Hematochezia 03/05/2016   Hypertension 2012   Hypertensive heart disease 12/19/2015   Plantar fasciitis 01/02/2019   S/P hydrothermal endometrial ablation on 03/31/2012 03/31/2012   Past Surgical History:  Procedure Laterality Date   CARDIAC CATHETERIZATION N/A 12/18/2015   Procedure: Left Heart Cath and Coronary Angiography;  Surgeon: Tonny Bollman, MD;  Location: [facility]  INVASIVE CV LAB;  Service: Cardiovascular;  Laterality: N/A;   ENDOMETRIAL ABLATION  2013   LAPAROSCOPY FOR ECTOPIC PREGNANCY     RENAL ARTERY STENT     TUBAL LIGATION  1989   Social History   Tobacco Use   Smoking status: Never   Smokeless tobacco: Never  Substance Use Topics   Alcohol use: No   Drug use: No   Social History   Socioeconomic History   Marital status: Legally Separated    Spouse name: Not on file   Number of children: Not on file   Years of education: Not on file   Highest education level: Not on file  Occupational History    Comment: [employer]  Tobacco Use   Smoking status: Never   Smokeless tobacco: Never  Substance and Sexual Activity   Alcohol use: No   Drug use: No   Sexual activity: Yes    Birth control/protection: Surgical  Other Topics Concern   Not on file  Social History Narrative   Exercise ---  weekly    Social Drivers of Health   Financial Resource Strain: Low Risk  (10/24/2022)   Overall Financial Resource Strain (CARDIA)    Difficulty of Paying Living Expenses: Not very hard  Food Insecurity: No Food Insecurity (10/24/2022)   Hunger Vital Sign    Worried About Running Out of Food in the Last Year: Never true    Ran Out of Food in the Last Year: Never true  Transportation Needs: No Transportation Needs (10/24/2022)   PRAPARE - Administrator, Civil Service (Medical): No    Lack of Transportation (Non-Medical): No  Physical Activity: Not on file  Stress: Not on file  Social Connections: Not on file  Intimate Partner Violence: Not on file   Family Status  Relation Name Status   Mother  Alive   Brother  Deceased at age 60       unsure cause   Father  Deceased at age 66       stroke,dm    Daughter  Alive   Son  Audiological scientist  (Not Specified)   Neg Hx  (Not Specified)  No partnership data on file   Family History  Problem Relation Age of Onset   Hypertension Mother    Cancer Brother    Diabetes Brother     Diabetes Father    Stroke Father    Neuropathy Father    Hyperlipidemia Father    Neuropathy Brother    Thyroid disease Neg Hx    Allergies  Allergen Reactions   Folic Acid Rash    Pt declines this allergy   Sulfa Antibiotics Rash   Sulfasalazine Itching and Rash      Review of Systems  Constitutional:  Negative for fever and malaise/fatigue.  HENT:  Negative for congestion.   Eyes:  Negative for blurred vision.  Respiratory:  Negative for cough and shortness of breath.   Cardiovascular:  Negative for chest pain, palpitations and leg swelling.  Gastrointestinal:  Positive for abdominal pain, nausea and vomiting. Negative for blood in stool, constipation, diarrhea  and melena.  Musculoskeletal:  Negative for back pain.  Skin:  Negative for rash.  Neurological:  Negative for loss of consciousness and headaches.      Objective:     BP 102/70 (BP Location: Left Arm, Patient Position: Sitting, Cuff Size: Large)   Pulse 80   Temp 98 F (36.7 C) (Oral)   Resp 18   Ht 5\' 7"  (1.702 m)   Wt 198 lb (89.8 kg)   SpO2 99%   BMI 31.01 kg/m  BP Readings from Last 3 Encounters:  10/31/23 102/70  03/04/23 124/88  01/21/23 (!) 130/90   Wt Readings from Last 3 Encounters:  10/31/23 198 lb (89.8 kg)  03/04/23 202 lb 12.8 oz (92 kg)  01/21/23 198 lb 12.8 oz (90.2 kg)   SpO2 Readings from Last 3 Encounters:  10/31/23 99%  03/04/23 99%  01/21/23 94%      Physical Exam   Results for orders placed or performed in visit on 10/31/23  POC COVID-19  Result Value Ref Range   SARS Coronavirus 2 Ag Negative Negative  POC Influenza A&B (Binax test)  Result Value Ref Range   Influenza A, POC Negative Negative   Influenza B, POC Negative Negative  POCT Urinalysis Dipstick (Automated)  Result Value Ref Range   Color, UA Golden Yellow    Clarity, UA Clear    Glucose, UA Negative Negative   Bilirubin, UA Moderate    Ketones, UA Trace    Spec Grav, UA 1.020 1.010 - 1.025    Blood, UA Negative    pH, UA 6.0 5.0 - 8.0   Protein, UA Negative Negative   Urobilinogen, UA 0.2 0.2 or 1.0 E.U./dL   Nitrite, UA Negative    Leukocytes, UA Negative Negative    Last CBC Lab Results  Component Value Date   WBC 7.1 03/04/2023   HGB 12.4 03/04/2023   HCT 38.7 03/04/2023   MCV 88.9 03/04/2023   MCH 29.9 05/16/2021   RDW 13.6 03/04/2023   PLT 355.0 03/04/2023   Last metabolic panel Lab Results  Component Value Date   GLUCOSE 86 03/04/2023   NA 142 03/04/2023   K 3.9 03/04/2023   CL 104 03/04/2023   CO2 29 03/04/2023   BUN 15 03/04/2023   CREATININE 0.86 03/04/2023   GFR 75.15 03/04/2023   CALCIUM 10.0 03/04/2023   PROT 7.8 03/04/2023   ALBUMIN 4.3 03/04/2023   LABGLOB 3.0 07/30/2019   AGRATIO 1.3 07/30/2019   BILITOT 0.6 03/04/2023   ALKPHOS 86 03/04/2023   AST 25 03/04/2023   ALT 27 03/04/2023   ANIONGAP 7 05/16/2021   Last lipids Lab Results  Component Value Date   CHOL 146 03/04/2023   HDL 49.70 03/04/2023   LDLCALC 81 03/04/2023   TRIG 76.0 03/04/2023   CHOLHDL 3 03/04/2023   Last hemoglobin A1c No results found for: "HGBA1C" Last thyroid functions Lab Results  Component Value Date   TSH 1.17 03/04/2023   Last vitamin D No results found for: "25OHVITD2", "25OHVITD3", "VD25OH" Last vitamin B12 and Folate No results found for: "VITAMINB12", "FOLATE"    The ASCVD Risk score (Arnett DK, et al., 2019) failed to calculate for the following reasons:   Risk score cannot be calculated because patient has a medical history suggesting prior/existing ASCVD    Assessment & Plan:   Problem List Items Addressed This Visit   None Visit Diagnoses       Nausea and vomiting, unspecified vomiting type    -  Primary   Relevant Medications   ondansetron (ZOFRAN) 4 MG tablet   Other Relevant Orders   POC COVID-19 (Completed)   POC Influenza A&B (Binax test) (Completed)   POCT Urinalysis Dipstick (Automated) (Completed)   CBC with  Differential/Platelet   Comprehensive metabolic panel   Amylase   Lipase     Pain of upper abdomen       Relevant Orders   POCT Urinalysis Dipstick (Automated) (Completed)   CBC with Differential/Platelet   Comprehensive metabolic panel   Amylase   Lipase     Assessment and Plan    Gastroenteritis Acute onset of vomiting and fever began Tuesday morning, with no diarrhea reported. Symptoms include persistent headache, sore stomach, and loss of appetite, likely due to a viral cause given recent exposure to a client with similar symptoms and no prior COVID-19 detection. Differential diagnosis includes norovirus and other viral gastroenteritis. Emphasized the importance of hydration and the potential need for IV fluids if oral intake cannot be maintained. If Zofran is ineffective, an ER visit is necessary for further management. Order COVID-19 and flu tests. Encourage hydration with Gatorade or Pedialyte and advise a bland diet of toast, chicken broth, and chicken noodle soup. Prescribe Zofran for nausea. Check urine for ketones to assess dehydration. Advise an ER visit if vomiting persists or if unable to keep Zofran down. Recommend wearing a mask in the ER due to high prevalence of flu and GI bugs.  Dehydration Likely secondary to excessive vomiting, with symptoms of feeling dehydrated and a sore stomach. No diarrhea reported. Emphasized the importance of oral rehydration and the potential need for IV fluids if symptoms worsen or if oral intake cannot be maintained. Encourage oral rehydration with Gatorade or Pedialyte. Check urine for ketones to confirm dehydration. Advise an ER visit for IV fluids if symptoms worsen or if unable to maintain oral intake.  General Health Maintenance Received a flu shot in September or October. No further actions needed.      Flu and covid --- negative   Return if symptoms worsen or fail to improve.    Donato Schultz, DO +

## 2023-10-31 NOTE — Patient Instructions (Signed)
Vomiting, Adult Vomiting is when stomach contents forcefully come out of the mouth. Many people notice nausea before vomiting. Vomiting can make you feel weak and cause you to become dehydrated. Dehydration can make you feel tired and thirsty, cause you to have a dry mouth, and decrease how often you urinate. Older adults and people who have other diseases or a weak body defense system (immune system) are at higher risk for dehydration. It is important to treat vomiting as told by your health care provider. Follow these instructions at home:  Watch your symptoms for any changes. Tell your health care provider about them. Eating and drinking     Follow these recommendations as told by your health care provider: Take an oral rehydration solution (ORS). This is a drink that is sold at pharmacies and retail stores. Eat bland, easy-to-digest foods in small amounts as you are able. These foods include bananas, applesauce, rice, lean meats, toast, and crackers. Drink clear fluids slowly and in small amounts as you are able. Clear fluids include water, ice chips, low-calorie sports drinks, and fruit juice that has water added (diluted fruit juice). Avoid drinking fluids that contain a lot of sugar or caffeine, such as energy drinks, sports drinks, and soda. Avoid alcohol. Avoid spicy or fatty foods.  General instructions Wash your hands often using soap and water for at least 20 seconds. If soap and water are not available, use hand sanitizer. Make sure that everyone in your household washes their hands frequently. Take over-the-counter and prescription medicines only as told by your health care provider. Rest at home while you recover. Watch your condition for any changes. Keep all follow-up visits. This is important. Contact a health care provider if: Your vomiting gets worse. You have new symptoms. You have a fever. You cannot drink fluids without vomiting. You feel light-headed or  dizzy. You have a headache. You have muscle cramps. You have a rash. You have pain while urinating. Get help right away if: You have pain in your chest, neck, arm, or jaw. Your heart is beating very quickly. You have trouble breathing or you are breathing very quickly. You feel extremely weak or you faint. Your skin feels cold and clammy. You feel confused. You have persistent vomiting. You have vomit that is bright red or looks like black coffee grounds. You have stools (feces) that are bloody or black, or stools that look like tar. You have a severe headache, a stiff neck, or both. You have severe pain, cramping, or bloating in your abdomen. You have signs of dehydration, such as: Dark urine, very little urine, or no urine. Cracked lips. Dry mouth. Sunken eyes. Sleepiness. Weakness. These symptoms may be an emergency. Get help right away. Call 911. Do not wait to see if the symptoms will go away. Do not drive yourself to the hospital. Summary Vomiting is when stomach contents forcefully come out of the mouth. Vomiting can cause you to become dehydrated. It is important to treat vomiting as told by your health care provider. Follow your health care provider's instructions about eating and drinking. Wash your hands often using soap and water for at least 20 seconds. If soap and water are not available, use hand sanitizer. Watch your condition for any changes and for signs of dehydration. Keep all follow-up visits. This is important. This information is not intended to replace advice given to you by your health care provider. Make sure you discuss any questions you have with your health care provider.  Document Revised: 03/24/2021 Document Reviewed: 03/24/2021 Elsevier Patient Education  2024 [employer].

## 2023-11-11 ENCOUNTER — Encounter (HOSPITAL_BASED_OUTPATIENT_CLINIC_OR_DEPARTMENT_OTHER): Payer: Self-pay | Admitting: Cardiovascular Disease

## 2023-11-11 ENCOUNTER — Encounter (HOSPITAL_BASED_OUTPATIENT_CLINIC_OR_DEPARTMENT_OTHER): Payer: Self-pay | Admitting: *Deleted

## 2023-11-11 ENCOUNTER — Ambulatory Visit (INDEPENDENT_AMBULATORY_CARE_PROVIDER_SITE_OTHER): Payer: 59 | Admitting: Cardiovascular Disease

## 2023-11-11 VITALS — BP 110/82 | HR 78 | Ht 67.0 in | Wt 198.0 lb

## 2023-11-11 DIAGNOSIS — I119 Hypertensive heart disease without heart failure: Secondary | ICD-10-CM | POA: Diagnosis not present

## 2023-11-11 DIAGNOSIS — I6523 Occlusion and stenosis of bilateral carotid arteries: Secondary | ICD-10-CM

## 2023-11-11 DIAGNOSIS — I2129 ST elevation (STEMI) myocardial infarction involving other sites: Secondary | ICD-10-CM

## 2023-11-11 DIAGNOSIS — I10 Essential (primary) hypertension: Secondary | ICD-10-CM | POA: Diagnosis not present

## 2023-11-11 DIAGNOSIS — I2542 Coronary artery dissection: Secondary | ICD-10-CM

## 2023-11-11 NOTE — Patient Instructions (Signed)
 Medication Instructions:  Your physician recommends that you continue on your current medications as directed. Please refer to the Current Medication list given to you today.   *If you need a refill on your cardiac medications before your next appointment, please call your pharmacy*  Lab Work: NONE  Testing/Procedures: Your physician has requested that you have a carotid duplex. This test is an ultrasound of the carotid arteries in your neck. It looks at blood flow through these arteries that supply the brain with blood. Allow one hour for this exam. There are no restrictions or special instructions. TO BE DONE IN APRIL   Follow-Up: At [facility], you and your health needs are our priority.  As part of our continuing mission to provide you with exceptional heart care, we have created designated Provider Care Teams.  These Care Teams include your primary Cardiologist (physician) and Advanced Practice Providers (APPs -  Physician Assistants and Nurse Practitioners) who all work together to provide you with the care you need, when you need it.  We recommend signing up for the patient portal called "MyChart".  Sign up information is provided on this After Visit Summary.  MyChart is used to connect with patients for Virtual Visits (Telemedicine).  Patients are able to view lab/test results, encounter notes, upcoming appointments, etc.  Non-urgent messages can be sent to your provider as well.   To learn more about what you can do with MyChart, go to [email or web address].    Your next appointment:   12 month(s)  Provider:   Maudine Sos, MD or Neomi Banks, NP

## 2023-11-11 NOTE — Progress Notes (Signed)
 Cardiology Office Note:  .   Date:  12/04/2023  ID:  Melody Gardner, DOB 04-15-1966, MRN [id number] PCP: Zola Button, Grayling Congress, DO   [facility] Providers Cardiologist:  Melody Si, MD    History of Present Illness: .   Melody Gardner is a 58 y.o. 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  [name] was admitted 12/18/15 with chest pain.  Cardiac enzymes were elevated and she had mild lateral ST elevation.  In the cath she was found to have a spontaneous dissection with 100% occlusion of D1 that was medically managed. Her echo showed LVEF 60-65% with grade 1 diastolic dysfunction.   [name] reports having her renal artery stented when she was around 40.   Given this history, there was concern for fibromuscular dysplasia.  She was referred for carotid ultrasound that revealed bilateral intimal thickening with 1-39% stenosis bilaterally but no plaque.    [name] underwent CT angiography of her whole body 03/2016.  She was found to have bilateral cervical ICA fibromuscular dysplasia.  Carotid ultrasound revealed 1-39% stenosis bilaterally.  She was evaluated by Dr. Edilia Bo 07/04/16 and it was felt that no intervention was necessary. There was no FMD of the renal arteries.  She was also noted to have a 12 mm soft tissue nodule in the right neck suspicious for an ectopic parathyroid adenoma and dental caries.  She had a repeat CT of the head and neck 06/2018 that was unchanged from 2017.  She last saw [name], NP 08/2020 and was doing well. Blood pressure was slightly elevated in the office but controlled at home.  At her appointment 10/2022 she reported occasional atypical chest pain.  It improved with Zantac and was thought to be GI related.    [name] coninues to experiences heartburn-like symptoms, suspected to be acid reflux, which began last  week and occur after eating. Symptoms sometimes arise during exercise but not consistently. She takes omeprazole daily, which effectively manages her symptoms, but missing a dose results in significant heartburn. A recent episode of severe heartburn occurred when she ran out of omeprazole, unrelieved by antacids or baking soda with water.  She has neck pain, potentially related to her fibromuscular dysplasia, occurring in her neck. She is uncertain if it is related to her sleeping position. Previously, her legs were checked for blood clots due to heaviness and swelling in one leg, but no clots were found. The swelling has resolved, but occasional leg pain, particularly at night, persists.  Her blood pressure is well-controlled at home, typically around 120/80 mmHg. She is on a regimen of amlodipine, losartan, hydrochlorothiazide, metoprolol, and a daily baby aspirin.  She exercises daily and has lost weight, attributing this to regular gym visits. She feels better overall with her weight loss and exercise routine. She follows a diet including fish, nuts, and whole grains, avoiding processed carbohydrates.  She has a history of being evaluated for Crohn's disease, which was ruled out following a colonoscopy. She was initially treated for Crohn's but discontinued the medication after the diagnosis was not confirmed.     ROS:  As per HPI  Studies Reviewed: .       Echo 11/2015: Study Conclusions   - Left ventricle: The cavity size was normal. Wall thickness was    normal. Systolic function was normal. The estimated ejection    fraction was in  the range of 60% to 65%. Mildly abnormal GLPSS at    -17%, more regional inferior strain. Wall motion was normal;    there were no regional wall motion abnormalities. Doppler    parameters are consistent with abnormal left ventricular    relaxation (grade 1 diastolic dysfunction). The E/e&' ratio is    between 8-15, suggesting indeterminate LV filling  pressure.  - Left atrium: The atrium was normal in size.  - Tricuspid valve: There was no significant regurgitation.  - Inferior vena cava: The vessel was normal in size. The    respirophasic diameter changes were in the normal range (= 50%),    consistent with normal central venous pressure.   LHC 11/2015: 1st Diag lesion, 100% stenosed. There is mild to moderate left ventricular systolic dysfunction.   1. Total occlusion of the first diagonal branch of the LAD, with typical angiographic appearance of spontaneous coronary artery dissection/intramural hematoma 2. Normal left main, left circumflex, LAD, and RCA 3. 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 Treat with opioid analgesics and ASA. Post-MI medical therapy with beta-blocker/ACE as tolerated. Avoid heparin, P2Y12 inhibitors. Wrote to start statin but would only continue if hyperlipidemia present. Would keep in hospital 48 hours minimum. Check echo and carotid duplex (eval for FMD)    Risk Assessment/Calculations:         Physical Exam:   VS:  BP 110/82 (BP Location: Left Arm, Patient Position: Sitting, Cuff Size: Large)   Pulse 78   Ht 5\' 7"  (1.702 m)   Wt 198 lb (89.8 kg)   SpO2 95%   BMI 31.01 kg/m  , BMI Body mass index is 31.01 kg/m. GENERAL:  Well appearing HEENT: Pupils equal round and reactive, fundi not visualized, oral mucosa unremarkable NECK:  No jugular venous distention, waveform within normal limits, carotid upstroke brisk and symmetric, no bruits, no thyromegaly LUNGS:  Clear to auscultation bilaterally HEART:  RRR.  PMI not displaced or sustained,S1 and S2 within normal limits, no S3, no S4, no clicks, no rubs, no murmurs ABD:  Flat, positive bowel sounds normal in frequency in pitch, no bruits, no rebound, no guarding, no midline pulsatile mass, no hepatomegaly, no splenomegaly EXT:  2 plus pulses throughout, no  edema, no cyanosis no clubbing SKIN:  No rashes no nodules NEURO:  Cranial nerves II through XII grossly intact, motor grossly intact throughout PSYCH:  Cognitively intact, oriented to person place and time   ASSESSMENT AND PLAN: .    # Gastroesophageal Reflux Disease (GERD): Reports of heartburn-like symptoms, particularly after eating and sometimes during exercise. Symptoms improve with Omeprazole use. -Continue Omeprazole daily, taken 30 minutes before meals. -Consider referral to Gastroenterology if symptoms persist despite medication.  # Fibromuscular Dysplasia: # SCAD: Reports of neck pain, unclear if related to diagnosed condition.  Known FMD seen on Dopplers 12/2022. -Continue current management. -Plan for carotid Dopplers in April 2025.  # Unilateral Leg Pain Reports of right leg pain and previous swelling, no current swelling. No evidence of blood clot on previous examination. -Continue monitoring symptoms. -Consider referral to Orthopedics or Neurology if symptoms persist or worsen.  # Hypertension Blood pressure well-controlled on current regimen of Amlodipine, Losartan, Hydrochlorothiazide, and Metoprolol. -Continue current medications. -Continue home blood pressure monitoring.  # Hyperlipidemia Cholesterol levels well-controlled. -Continue current management. -Encourage continuation of exercise and healthy diet.  Follow-up Plans -Schedule follow-up appointment in one year. -Contact office if  chest pain occurs despite Omeprazole use.      Dispo: f/u in 1 year  Signed, Melody Si, MD

## 2023-11-13 ENCOUNTER — Other Ambulatory Visit (HOSPITAL_BASED_OUTPATIENT_CLINIC_OR_DEPARTMENT_OTHER): Payer: Self-pay | Admitting: *Deleted

## 2023-11-13 DIAGNOSIS — I6523 Occlusion and stenosis of bilateral carotid arteries: Secondary | ICD-10-CM

## 2023-11-18 ENCOUNTER — Telehealth (HOSPITAL_BASED_OUTPATIENT_CLINIC_OR_DEPARTMENT_OTHER): Payer: Self-pay | Admitting: *Deleted

## 2023-11-18 NOTE — Telephone Encounter (Signed)
 Patient was seen by Dr Duke Salvia and carotid duplex ordered  Last check 01/02/2023 said to f/u in 2 years. Discussed with Dr Duke Salvia and ok to schedule for follow up 12/2024 Spoke with patient, advised, and scheduled

## 2023-12-04 ENCOUNTER — Encounter (HOSPITAL_BASED_OUTPATIENT_CLINIC_OR_DEPARTMENT_OTHER): Payer: Self-pay | Admitting: Cardiovascular Disease

## 2023-12-04 ENCOUNTER — Other Ambulatory Visit: Payer: Self-pay | Admitting: Cardiovascular Disease

## 2024-01-04 ENCOUNTER — Other Ambulatory Visit: Payer: Self-pay | Admitting: Cardiovascular Disease

## 2024-01-11 DIAGNOSIS — Z419 Encounter for procedure for purposes other than remedying health state, unspecified: Secondary | ICD-10-CM | POA: Diagnosis not present

## 2024-01-21 ENCOUNTER — Ambulatory Visit (INDEPENDENT_AMBULATORY_CARE_PROVIDER_SITE_OTHER): Payer: PRIVATE HEALTH INSURANCE

## 2024-01-21 DIAGNOSIS — I6523 Occlusion and stenosis of bilateral carotid arteries: Secondary | ICD-10-CM

## 2024-02-05 ENCOUNTER — Encounter (HOSPITAL_BASED_OUTPATIENT_CLINIC_OR_DEPARTMENT_OTHER): Payer: Self-pay | Admitting: Cardiovascular Disease

## 2024-02-10 DIAGNOSIS — Z419 Encounter for procedure for purposes other than remedying health state, unspecified: Secondary | ICD-10-CM | POA: Diagnosis not present

## 2024-02-13 ENCOUNTER — Encounter (HOSPITAL_BASED_OUTPATIENT_CLINIC_OR_DEPARTMENT_OTHER): Payer: 59

## 2024-03-12 DIAGNOSIS — Z419 Encounter for procedure for purposes other than remedying health state, unspecified: Secondary | ICD-10-CM | POA: Diagnosis not present

## 2024-04-06 ENCOUNTER — Other Ambulatory Visit: Payer: Self-pay | Admitting: Cardiovascular Disease

## 2024-04-11 DIAGNOSIS — Z419 Encounter for procedure for purposes other than remedying health state, unspecified: Secondary | ICD-10-CM | POA: Diagnosis not present

## 2024-05-12 DIAGNOSIS — Z419 Encounter for procedure for purposes other than remedying health state, unspecified: Secondary | ICD-10-CM | POA: Diagnosis not present

## 2024-05-15 ENCOUNTER — Ambulatory Visit (INDEPENDENT_AMBULATORY_CARE_PROVIDER_SITE_OTHER): Payer: PRIVATE HEALTH INSURANCE | Admitting: Family Medicine

## 2024-05-15 ENCOUNTER — Ambulatory Visit (HOSPITAL_BASED_OUTPATIENT_CLINIC_OR_DEPARTMENT_OTHER)
Admission: RE | Admit: 2024-05-15 | Discharge: 2024-05-15 | Disposition: A | Discharge status: Home / Self Care | Source: Ambulatory Visit | Attending: Family Medicine | Admitting: Family Medicine

## 2024-05-15 ENCOUNTER — Encounter: Payer: Self-pay | Admitting: Family Medicine

## 2024-05-15 VITALS — BP 104/80 | HR 74 | Temp 98.2°F | Resp 18 | Ht 67.0 in | Wt 188.4 lb

## 2024-05-15 DIAGNOSIS — M79661 Pain in right lower leg: Secondary | ICD-10-CM | POA: Diagnosis not present

## 2024-05-15 DIAGNOSIS — R1013 Epigastric pain: Secondary | ICD-10-CM | POA: Diagnosis not present

## 2024-05-15 DIAGNOSIS — M25561 Pain in right knee: Secondary | ICD-10-CM | POA: Diagnosis not present

## 2024-05-15 DIAGNOSIS — M7989 Other specified soft tissue disorders: Secondary | ICD-10-CM | POA: Diagnosis not present

## 2024-05-15 MED ORDER — OMEPRAZOLE 20 MG PO CPDR: 20.0000 mg | DELAYED_RELEASE_CAPSULE | Freq: Every day | ORAL | 3 refills | Status: AC

## 2024-05-15 NOTE — Progress Notes (Signed)
 Subjective:    Patient ID: Melody Gardner, female    DOB: Nov 01, 1965, 58 y.o.   MRN: [id number]  Chief Complaint  Patient presents with   Leg Pain    Right calf, sxs started Monday, pt states was having swelling. Pain with walking, no falls     HPI Patient is in today for right calf pain---- Discussed the use of AI scribe software for clinical note transcription with the patient, who gave verbal consent to proceed.  History of Present Illness Melody Gardner is a 58 year old female who presents with leg pain and swelling.  She has been experiencing sharp leg pain since earlier this week, located in the posterior aspect of her leg. The pain is more severe than a cramp and initially started in a different area before moving to its current location. It is exacerbated by bending and walking, significantly affecting her ability to climb stairs earlier in the week.  In addition to the pain, there is swelling in the affected leg. She managed to reduce the swelling temporarily by wrapping it, but it has since returned. She recalls a similar episode of pain in June, during which an ultrasound ruled out blood clots, but there was no swelling at that time.  She has been using wintergreen alcohol and pain relief balm on the affected area, which provided some relief. She has also taken acetaminophen  and aspirin , though she has not taken aspirin  recently. No recent falls or injuries.  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 She has not tried other topical treatments like Bengay or Biofreeze.    Past Medical History:  Diagnosis Date   Abnormal uterine bleeding 01/30/2012   Benign endometrial biopsy, showed endometrial polyps    Acute MI, lateral wall, initial episode of care (HCC) 12/18/2015   Anemia 2013   Carotid artery disease (HCC) 01/30/2019   Mild 1-39%-  by CTA 9/19   Coronary artery dissection 12/19/2015   SCAD March 2017- normal LVF then 60-65%, no other CAD   Crohn's  disease (HCC)    Crohn's disease of both small and large intestine without complication (HCC) 12/14/2015   Crohn's disease with complication (HCC) 06/17/2018   Depression    pt states she is depressed but not medically diagnosed   Endometrial polyp 01/31/2012   On endometrial biopsy done 01/30/12    Fibroids 01/30/2012   Hematochezia 03/05/2016   Hypertension 2012   Hypertensive heart disease 12/19/2015   Plantar fasciitis 01/02/2019   S/P hydrothermal endometrial ablation on 03/31/2012 03/31/2012    Past Surgical History:  Procedure Laterality Date   CARDIAC CATHETERIZATION N/A 12/18/2015   Procedure: Left Heart Cath and Coronary Angiography;  Surgeon: Ozell Fell, MD;  Location: [facility] INVASIVE CV LAB;  Service: Cardiovascular;  Laterality: N/A;   ENDOMETRIAL ABLATION  2013   LAPAROSCOPY FOR ECTOPIC PREGNANCY     RENAL ARTERY STENT     TUBAL LIGATION  1989    Family History  Problem Relation Age of Onset   Hypertension Mother    Cancer Brother    Diabetes Brother    Diabetes Father    Stroke Father    Neuropathy Father    Hyperlipidemia Father    Neuropathy Brother    Thyroid  disease Neg Hx     Social History   Socioeconomic History   Marital status: Legally Separated    Spouse name: Not on file   Number of children: Not on file  Years of education: Not on file   Highest education level: Not on file  Occupational History    Comment: [employer]  Tobacco Use   Smoking status: Never   Smokeless tobacco: Never  Substance and Sexual Activity   Alcohol use: No   Drug use: No   Sexual activity: Yes    Birth control/protection: Surgical  Other Topics Concern   Not on file  Social History Narrative   Exercise ---  weekly    Social Drivers of Health   Financial Resource Strain: Low Risk  (10/24/2022)   Overall Financial Resource Strain (CARDIA)    Difficulty of Paying Living Expenses: Not very hard  Food Insecurity: No Food Insecurity (10/24/2022)   Hunger Vital Sign     Worried About Running Out of Food in the Last Year: Never true    Ran Out of Food in the Last Year: Never true  Transportation Needs: No Transportation Needs (10/24/2022)   PRAPARE - Administrator, Civil Service (Medical): No    Lack of Transportation (Non-Medical): No  Physical Activity: Not on file  Stress: Not on file  Social Connections: Not on file  Intimate Partner Violence: Not on file    Outpatient Medications Prior to Visit  Medication Sig Dispense Refill   amLODipine  (NORVASC ) 10 MG tablet TAKE 1 TABLET BY MOUTH EVERY DAY 180 tablet 3   aspirin  EC 81 MG tablet Take 1 tablet (81 mg total) by mouth daily. 30 tablet 12   Fezolinetant  (VEOZAH ) 45 MG TABS Take 1 tablet (45 mg total) by mouth daily. 10 tablet 0   losartan -hydrochlorothiazide  (HYZAAR) 100-12.5 MG tablet TAKE 1 TABLET BY MOUTH DAILY 90 tablet 3   metoprolol  tartrate (LOPRESSOR ) 25 MG tablet TAKE 1 TABLET(25 MG) BY MOUTH TWICE DAILY 180 tablet 1   Multiple Vitamins-Minerals (WOMENS ONE DAILY PO) Take 1 tablet by mouth daily.     nitroGLYCERIN  (NITROSTAT ) 0.4 MG SL tablet Place 1 tablet (0.4 mg total) under the tongue every 5 (five) minutes as needed for chest pain. 25 tablet PRN   omeprazole  (PRILOSEC) 20 MG capsule Take 1 capsule (20 mg total) by mouth daily. 30 capsule 0   fluticasone  (FLONASE ) 50 MCG/ACT nasal spray Place 1 spray into both nostrils daily. 5 g 0   ondansetron  (ZOFRAN ) 4 MG tablet Take 1 tablet (4 mg total) by mouth every 8 (eight) hours as needed for nausea or vomiting. (Patient not taking: Reported on 05/15/2024) 20 tablet 0   triamcinolone  cream (KENALOG ) 0.1 % Apply 1 Application topically 2 (two) times daily. (Patient not taking: Reported on 10/31/2023) 30 g 0   No facility-administered medications prior to visit.    Allergies  Allergen Reactions   Folic Acid Rash    Pt declines this allergy   Sulfa Antibiotics Rash   Sulfasalazine Itching and Rash    Review of Systems   Constitutional:  Negative for fever and malaise/fatigue.  HENT:  Negative for congestion.   Eyes:  Negative for blurred vision.  Respiratory:  Negative for shortness of breath.   Cardiovascular:  Negative for chest pain, palpitations and leg swelling.  Gastrointestinal:  Negative for abdominal pain, blood in stool and nausea.  Genitourinary:  Negative for dysuria and frequency.  Musculoskeletal:  Positive for myalgias. Negative for falls and joint pain.  Skin:  Negative for rash.  Neurological:  Negative for dizziness, loss of consciousness and headaches.  Endo/Heme/Allergies:  Negative for environmental allergies.  Psychiatric/Behavioral:  Negative for depression.  The patient is not nervous/anxious.        Objective:    Physical Exam Vitals and nursing note reviewed.  Constitutional:      General: She is not in acute distress.    Appearance: Normal appearance. She is well-developed.  HENT:     Head: Normocephalic and atraumatic.  Eyes:     General: No scleral icterus.       Right eye: No discharge.        Left eye: No discharge.  Cardiovascular:     Rate and Rhythm: Normal rate and regular rhythm.     Heart sounds: No murmur heard. Pulmonary:     Effort: Pulmonary effort is normal. No respiratory distress.     Breath sounds: Normal breath sounds.  Musculoskeletal:        General: Swelling and tenderness present. Normal range of motion.     Cervical back: Normal range of motion and neck supple.     Right lower leg: No edema.     Left lower leg: No edema.  Skin:    General: Skin is warm and dry.  Neurological:     Mental Status: She is alert and oriented to person, place, and time.  Psychiatric:        Mood and Affect: Mood normal.        Behavior: Behavior normal.        Thought Content: Thought content normal.        Judgment: Judgment normal.     BP 104/80 (BP Location: Left Arm, Patient Position: Sitting, Cuff Size: Large)   Pulse 74   Temp 98.2 F (36.8 C)  (Oral)   Resp 18   Ht 5' 7 (1.702 m)   Wt 188 lb 6.4 oz (85.5 kg)   SpO2 98%   BMI 29.51 kg/m  Wt Readings from Last 3 Encounters:  05/15/24 188 lb 6.4 oz (85.5 kg)  11/11/23 198 lb (89.8 kg)  10/31/23 198 lb (89.8 kg)    Diabetic Foot Exam - Simple   No data filed    Lab Results  Component Value Date   WBC 5.7 10/31/2023   HGB 13.1 10/31/2023   HCT 40.0 10/31/2023   PLT 366.0 10/31/2023   GLUCOSE 87 10/31/2023   CHOL 146 03/04/2023   TRIG 76.0 03/04/2023   HDL 49.70 03/04/2023   LDLCALC 81 03/04/2023   ALT 24 10/31/2023   AST 21 10/31/2023   NA 142 10/31/2023   K 4.1 10/31/2023   CL 103 10/31/2023   CREATININE 0.93 10/31/2023   BUN 17 10/31/2023   CO2 29 10/31/2023   TSH 1.17 03/04/2023    Lab Results  Component Value Date   TSH 1.17 03/04/2023   Lab Results  Component Value Date   WBC 5.7 10/31/2023   HGB 13.1 10/31/2023   HCT 40.0 10/31/2023   MCV 87.2 10/31/2023   PLT 366.0 10/31/2023   Lab Results  Component Value Date   NA 142 10/31/2023   K 4.1 10/31/2023   CO2 29 10/31/2023   GLUCOSE 87 10/31/2023   BUN 17 10/31/2023   CREATININE 0.93 10/31/2023   BILITOT 0.9 10/31/2023   ALKPHOS 95 10/31/2023   AST 21 10/31/2023   ALT 24 10/31/2023   PROT 7.5 10/31/2023   ALBUMIN 4.3 10/31/2023   CALCIUM  10.1 10/31/2023   ANIONGAP 7 05/16/2021   GFR 68.10 10/31/2023   Lab Results  Component Value Date   CHOL 146 03/04/2023   Lab Results  Component Value Date   HDL 49.70 03/04/2023   Lab Results  Component Value Date   LDLCALC 81 03/04/2023   Lab Results  Component Value Date   TRIG 76.0 03/04/2023   Lab Results  Component Value Date   CHOLHDL 3 03/04/2023   No results found for: HGBA1C     Assessment & Plan:  Right calf pain -     US  Venous Img Lower Unilateral Right (DVT); Future  Dyspepsia -     Omeprazole ; Take 1 capsule (20 mg total) by mouth daily.  Dispense: 90 capsule; Refill: 3   Assessment and Plan Assessment &  Plan Right lower leg pain and swelling with evaluation for possible deep vein thrombosis (DVT)   Intermittent right lower leg pain and swelling have been present since Monday, with pain localized to the posterior aspect. Previous ultrasound in June showed no blood clots. Current symptoms include increased swelling and sharp pain, worse than a cramp. Differential diagnosis includes possible Baker's cyst or other musculoskeletal issues. No recent trauma or falls reported. Pain is partially relieved by acetaminophen  and topical pain relief balm. Current footwear may contribute to symptoms. Order a repeat ultrasound of the right lower leg to evaluate for possible DVT or other abnormalities. Refer to sports medicine if the ultrasound is normal for further evaluation of recurrent symptoms. Advise wearing tennis shoes or good work shoes daily to potentially alleviate symptoms. Schedule ultrasound in the radiology department today.   Delan Ksiazek R Lowne Chase, DO

## 2024-05-18 ENCOUNTER — Ambulatory Visit: Payer: Self-pay | Admitting: Family Medicine

## 2024-06-12 DIAGNOSIS — Z419 Encounter for procedure for purposes other than remedying health state, unspecified: Secondary | ICD-10-CM | POA: Diagnosis not present

## 2024-06-15 ENCOUNTER — Ambulatory Visit (INDEPENDENT_AMBULATORY_CARE_PROVIDER_SITE_OTHER): Admitting: Family Medicine

## 2024-06-15 ENCOUNTER — Encounter: Payer: Self-pay | Admitting: Family Medicine

## 2024-06-15 VITALS — BP 120/80 | HR 76 | Temp 98.2°F | Resp 16 | Ht 67.0 in | Wt 190.0 lb

## 2024-06-15 DIAGNOSIS — I10 Essential (primary) hypertension: Secondary | ICD-10-CM | POA: Diagnosis not present

## 2024-06-15 DIAGNOSIS — Z0000 Encounter for general adult medical examination without abnormal findings: Secondary | ICD-10-CM

## 2024-06-15 DIAGNOSIS — Z23 Encounter for immunization: Secondary | ICD-10-CM

## 2024-06-15 DIAGNOSIS — Z202 Contact with and (suspected) exposure to infections with a predominantly sexual mode of transmission: Secondary | ICD-10-CM

## 2024-06-15 NOTE — Progress Notes (Signed)
 Subjective:    Patient ID: Donnita Daring, female    DOB: 1966-03-30, 58 y.o.   MRN: [id number]  Chief Complaint  Patient presents with   Annual Exam    Pt states fasting     HPI Patient is in today for CPE.  Discussed the use of AI scribe software for clinical note transcription with the patient, who gave verbal consent to proceed.  History of Present Illness Morgaine Kimball is a 58 year old female who presents for an annual physical exam.  She has been experiencing low blood pressure readings at home, approximately 110/86 mmHg, and has started taking iron supplements. She has started taking iron supplements and feels they have improved her fatigue.  She is concerned about potential exposure to sexually transmitted infections due to her partner's infidelity and requests testing for HIV and other STIs, stating 'I'm not trying to catch nothing.'  She experiences bone aches and questions if it could be related to a vitamin D deficiency. She takes multivitamins but is unsure if they contain sufficient vitamin D.  She reports pain in the bottom of her legs and a sensation on the top of her feet, which someone suggested might be gout. She denies having gout and is not diabetic. She also has a bunion on her foot, which she thinks might be contributing to her discomfort.  She inquires about medication for hot flashes, mentioning that she did not take Veozah  due to cost. Her sister sent her a medication called 'Aztac' for hot flashes, but she has not taken it yet as she is unsure about it.  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 She exercises by running at the track and expresses dislike for going into clients' homes due to cleanliness concerns.    Past Medical History:  Diagnosis Date   Abnormal uterine bleeding 01/30/2012   Benign endometrial biopsy, showed endometrial polyps    Acute MI, lateral wall, initial episode of care (HCC) 12/18/2015   Anemia 2013   Carotid artery  disease (HCC) 01/30/2019   Mild 1-39%-  by CTA 9/19   Coronary artery dissection 12/19/2015   SCAD March 2017- normal LVF then 60-65%, no other CAD   Crohn's disease (HCC)    Crohn's disease of both small and large intestine without complication (HCC) 12/14/2015   Crohn's disease with complication (HCC) 06/17/2018   Depression    pt states she is depressed but not medically diagnosed   Endometrial polyp 01/31/2012   On endometrial biopsy done 01/30/12    Fibroids 01/30/2012   Hematochezia 03/05/2016   Hypertension 2012   Hypertensive heart disease 12/19/2015   Plantar fasciitis 01/02/2019   S/P hydrothermal endometrial ablation on 03/31/2012 03/31/2012    Past Surgical History:  Procedure Laterality Date   CARDIAC CATHETERIZATION N/A 12/18/2015   Procedure: Left Heart Cath and Coronary Angiography;  Surgeon: Ozell Fell, MD;  Location: [facility] INVASIVE CV LAB;  Service: Cardiovascular;  Laterality: N/A;   ENDOMETRIAL ABLATION  2013   LAPAROSCOPY FOR ECTOPIC PREGNANCY     RENAL ARTERY STENT     TUBAL LIGATION  1989    Family History  Problem Relation Age of Onset   Hypertension Mother    Cancer Brother    Diabetes Brother    Diabetes Father    Stroke Father    Neuropathy Father    Hyperlipidemia Father    Neuropathy Brother    Thyroid  disease Neg Hx     Social History  Socioeconomic History   Marital status: Legally Separated    Spouse name: Not on file   Number of children: Not on file   Years of education: Not on file   Highest education level: Not on file  Occupational History    Comment: [employer]  Tobacco Use   Smoking status: Never   Smokeless tobacco: Never  Substance and Sexual Activity   Alcohol use: No   Drug use: No   Sexual activity: Yes    Birth control/protection: Surgical  Other Topics Concern   Not on file  Social History Narrative   Exercise ---  weekly    Social Drivers of Health   Financial Resource Strain: Low Risk  (10/24/2022)   Overall  Financial Resource Strain (CARDIA)    Difficulty of Paying Living Expenses: Not very hard  Food Insecurity: No Food Insecurity (10/24/2022)   Hunger Vital Sign    Worried About Running Out of Food in the Last Year: Never true    Ran Out of Food in the Last Year: Never true  Transportation Needs: No Transportation Needs (10/24/2022)   PRAPARE - Administrator, Civil Service (Medical): No    Lack of Transportation (Non-Medical): No  Physical Activity: Not on file  Stress: Not on file  Social Connections: Not on file  Intimate Partner Violence: Not on file    Outpatient Medications Prior to Visit  Medication Sig Dispense Refill   amLODipine  (NORVASC ) 10 MG tablet TAKE 1 TABLET BY MOUTH EVERY DAY 180 tablet 3   aspirin  EC 81 MG tablet Take 1 tablet (81 mg total) by mouth daily. 30 tablet 12   Fezolinetant  (VEOZAH ) 45 MG TABS Take 1 tablet (45 mg total) by mouth daily. 10 tablet 0   losartan -hydrochlorothiazide  (HYZAAR) 100-12.5 MG tablet TAKE 1 TABLET BY MOUTH DAILY 90 tablet 3   metoprolol  tartrate (LOPRESSOR ) 25 MG tablet TAKE 1 TABLET(25 MG) BY MOUTH TWICE DAILY 180 tablet 1   Multiple Vitamins-Minerals (WOMENS ONE DAILY PO) Take 1 tablet by mouth daily.     nitroGLYCERIN  (NITROSTAT ) 0.4 MG SL tablet Place 1 tablet (0.4 mg total) under the tongue every 5 (five) minutes as needed for chest pain. 25 tablet PRN   omeprazole  (PRILOSEC) 20 MG capsule Take 1 capsule (20 mg total) by mouth daily. 90 capsule 3   No facility-administered medications prior to visit.    Allergies  Allergen Reactions   Folic Acid Rash    Pt declines this allergy   Sulfa Antibiotics Rash   Sulfasalazine Itching and Rash    Review of Systems  Constitutional:  Negative for fever and malaise/fatigue.  HENT:  Negative for congestion.   Eyes:  Negative for blurred vision.  Respiratory:  Negative for shortness of breath.   Cardiovascular:  Negative for chest pain, palpitations and leg swelling.   Gastrointestinal:  Negative for abdominal pain, blood in stool and nausea.  Genitourinary:  Negative for dysuria and frequency.  Musculoskeletal:  Negative for falls.  Skin:  Negative for rash.  Neurological:  Negative for dizziness, loss of consciousness and headaches.  Endo/Heme/Allergies:  Negative for environmental allergies.  Psychiatric/Behavioral:  Negative for depression. The patient is not nervous/anxious.        Objective:    Physical Exam Vitals and nursing note reviewed.  Constitutional:      General: She is not in acute distress.    Appearance: Normal appearance. She is well-developed.  HENT:     Head: Normocephalic and atraumatic.  Right Ear: Tympanic membrane, ear canal and external ear normal. There is no impacted cerumen.     Left Ear: Tympanic membrane, ear canal and external ear normal. There is no impacted cerumen.     Nose: Nose normal.     Mouth/Throat:     Mouth: Mucous membranes are moist.     Pharynx: Oropharynx is clear. No oropharyngeal exudate or posterior oropharyngeal erythema.  Eyes:     General: No scleral icterus.       Right eye: No discharge.        Left eye: No discharge.     Conjunctiva/sclera: Conjunctivae normal.     Pupils: Pupils are equal, round, and reactive to light.  Neck:     Thyroid : No thyromegaly or thyroid  tenderness.     Vascular: No JVD.  Cardiovascular:     Rate and Rhythm: Normal rate and regular rhythm.     Heart sounds: Normal heart sounds. No murmur heard. Pulmonary:     Effort: Pulmonary effort is normal. No respiratory distress.     Breath sounds: Normal breath sounds.  Abdominal:     General: Bowel sounds are normal. There is no distension.     Palpations: Abdomen is soft. There is no mass.     Tenderness: There is no abdominal tenderness. There is no guarding or rebound.  Musculoskeletal:        General: Normal range of motion.     Cervical back: Normal range of motion and neck supple.     Right lower  leg: No edema.     Left lower leg: No edema.  Lymphadenopathy:     Cervical: No cervical adenopathy.  Skin:    General: Skin is warm and dry.     Findings: No erythema or rash.  Neurological:     Mental Status: She is alert and oriented to person, place, and time.     Cranial Nerves: No cranial nerve deficit.     Deep Tendon Reflexes: Reflexes are normal and symmetric.  Psychiatric:        Mood and Affect: Mood normal.        Behavior: Behavior normal.        Thought Content: Thought content normal.        Judgment: Judgment normal.     BP 120/80 (BP Location: Right Arm, Patient Position: Sitting)   Pulse 76   Temp 98.2 F (36.8 C) (Oral)   Resp 16   Ht 5' 7 (1.702 m)   Wt 190 lb (86.2 kg)   SpO2 99%   BMI 29.76 kg/m  Wt Readings from Last 3 Encounters:  06/15/24 190 lb (86.2 kg)  05/15/24 188 lb 6.4 oz (85.5 kg)  11/11/23 198 lb (89.8 kg)    Diabetic Foot Exam - Simple   No data filed    Lab Results  Component Value Date   WBC 6.2 06/15/2024   HGB 12.0 06/15/2024   HCT 36.4 06/15/2024   PLT 317.0 06/15/2024   GLUCOSE 85 06/15/2024   CHOL 139 06/15/2024   TRIG 107.0 06/15/2024   HDL 43.20 06/15/2024   LDLCALC 74 06/15/2024   ALT 22 06/15/2024   AST 22 06/15/2024   NA 145 06/15/2024   K 3.7 06/15/2024   CL 104 06/15/2024   CREATININE 0.95 06/15/2024   BUN 14 06/15/2024   CO2 32 06/15/2024   TSH 0.91 06/15/2024    Lab Results  Component Value Date   TSH 0.91 06/15/2024  Lab Results  Component Value Date   WBC 6.2 06/15/2024   HGB 12.0 06/15/2024   HCT 36.4 06/15/2024   MCV 86.7 06/15/2024   PLT 317.0 06/15/2024   Lab Results  Component Value Date   NA 145 06/15/2024   K 3.7 06/15/2024   CO2 32 06/15/2024   GLUCOSE 85 06/15/2024   BUN 14 06/15/2024   CREATININE 0.95 06/15/2024   BILITOT 0.6 06/15/2024   ALKPHOS 82 06/15/2024   AST 22 06/15/2024   ALT 22 06/15/2024   PROT 7.1 06/15/2024   ALBUMIN 4.1 06/15/2024   CALCIUM  10.1  06/15/2024   ANIONGAP 7 05/16/2021   GFR 66.09 06/15/2024   Lab Results  Component Value Date   CHOL 139 06/15/2024   Lab Results  Component Value Date   HDL 43.20 06/15/2024   Lab Results  Component Value Date   LDLCALC 74 06/15/2024   Lab Results  Component Value Date   TRIG 107.0 06/15/2024   Lab Results  Component Value Date   CHOLHDL 3 06/15/2024   No results found for: HGBA1C     Assessment & Plan:  Preventative health care Assessment & Plan: Ghm utd Check labs  See AVS Health Maintenance  Topic Date Due   Pneumococcal Vaccine: 50+ Years (1 of 2 - PCV) Never done   Hepatitis B Vaccines 19-59 Average Risk (1 of 3 - 19+ 3-dose series) Never done   Zoster Vaccines- Shingrix  (2 of 2) 05/14/2023   DTaP/Tdap/Td (2 - Td or Tdap) 01/07/2024   COVID-19 Vaccine (3 - 2025-26 season) 06/01/2024   Mammogram  07/24/2024   Cervical Cancer Screening (HPV/Pap Cotest)  03/03/2026   Colonoscopy  02/14/2030   Influenza Vaccine  Completed   Hepatitis C Screening  Completed   HIV Screening  Completed   HPV VACCINES  Aged Out   Meningococcal B Vaccine  Aged Out     Orders: -     CBC with Differential/Platelet -     Comprehensive metabolic panel with GFR -     Lipid panel -     TSH  Primary hypertension  Need for influenza vaccination -     Flu vaccine trivalent PF, 6mos and older(Flulaval,Afluria,Fluarix,Fluzone)  Encounter for assessment of STD exposure -     NuSwab Vaginitis Plus (VG+) -     HepB+HepC+HIV Panel -     HSV(herpes simplex vrs) 1+2 ab-IgG -     RPR   Assessment and Plan Assessment & Plan Adult Wellness Visit   During her routine adult wellness visit, there were no changes in family history or new surgeries. She reports low blood pressure readings at home, possibly related to iron deficiency, with improvement after taking iron supplements. There are concerns about bone aches potentially related to vitamin D deficiency. Administer a flu shot.  Order comprehensive blood tests, including HIV, herpes, syphilis, and other STIs. Perform a self-swab for GC, chlamydia, yeast, and bacterial infections. Discuss vitamin D supplementation if necessary.  Essential hypertension   She reports low blood pressure readings at home, possibly due to iron deficiency, with current readings at 110/86 mmHg.  Bunion of foot   She reports pain on top of her feet, possibly related to a bunion. Discussed differential diagnosis, including gout and improper footwear. Recommend visiting Scientist, product/process development in [place] for proper fitting shoes and consult with Garrel for appropriate shoe options.  Menopausal symptoms (hot flashes)   She is experiencing hot flashes. Previously discussed Veozah  but did not take it  due to cost. Her sister provided an unknown medication (Aztac) for hot flashes, but she has not taken it due to uncertainty about its safety. Send a message with a picture of the medication for review and discuss alternative treatments for hot flashes.  General Health Maintenance   Discussed the need for regular eye exams and dental cleanings. She does not have a regular eye doctor and finds dental visits expensive. Encourage scheduling an eye exam and recommend the [facility] dental hygienist program for affordable teeth cleaning.   Mindy Behnken R Lowne Chase, DO

## 2024-06-16 LAB — LIPID PANEL
Cholesterol: 139 mg/dL (ref 0–200)
HDL: 43.2 mg/dL (ref >39.00)
LDL Cholesterol: 74 mg/dL (ref 0–99)
NonHDL: 95.49
Total CHOL/HDL Ratio: 3
Triglycerides: 107 mg/dL (ref 0.0–149.0)
VLDL: 21.4 mg/dL (ref 0.0–40.0)

## 2024-06-16 LAB — HEPB+HEPC+HIV PANEL
HIV Screen 4th Generation wRfx: NONREACTIVE
Hep B C IgM: NEGATIVE
Hep B Core Total Ab: NEGATIVE
Hep B E Ab: NONREACTIVE
Hep B E Ag: NEGATIVE
Hep B Surface Ab, Qual: REACTIVE
Hep C Virus Ab: NONREACTIVE
Hepatitis B Surface Ag: NEGATIVE

## 2024-06-16 LAB — CBC WITH DIFFERENTIAL/PLATELET
Basophils Absolute: 0.1 K/uL (ref 0.0–0.1)
Basophils Relative: 0.8 % (ref 0.0–3.0)
Eosinophils Absolute: 0.2 K/uL (ref 0.0–0.7)
Eosinophils Relative: 2.7 % (ref 0.0–5.0)
HCT: 36.4 % (ref 36.0–46.0)
Hemoglobin: 12 g/dL (ref 12.0–15.0)
Lymphocytes Relative: 38.2 % (ref 12.0–46.0)
Lymphs Abs: 2.4 K/uL (ref 0.7–4.0)
MCHC: 32.9 g/dL (ref 30.0–36.0)
MCV: 86.7 fl (ref 78.0–100.0)
Monocytes Absolute: 0.5 K/uL (ref 0.1–1.0)
Monocytes Relative: 7.7 % (ref 3.0–12.0)
Neutro Abs: 3.1 K/uL (ref 1.4–7.7)
Neutrophils Relative %: 50.6 % (ref 43.0–77.0)
Platelets: 317 K/uL (ref 150.0–400.0)
RBC: 4.2 Mil/uL (ref 3.87–5.11)
RDW: 13.8 % (ref 11.5–15.5)
WBC: 6.2 K/uL (ref 4.0–10.5)

## 2024-06-16 LAB — HSV(HERPES SIMPLEX VRS) I + II AB-IGG
HSV 1 IGG,TYPE SPECIFIC AB: 39.6 {index} — ABNORMAL HIGH
HSV 2 IGG,TYPE SPECIFIC AB: 2.91 {index} — ABNORMAL HIGH

## 2024-06-16 LAB — COMPREHENSIVE METABOLIC PANEL WITH GFR
ALT: 22 U/L (ref 0–35)
AST: 22 U/L (ref 0–37)
Albumin: 4.1 g/dL (ref 3.5–5.2)
Alkaline Phosphatase: 82 U/L (ref 39–117)
BUN: 14 mg/dL (ref 6–23)
CO2: 32 meq/L (ref 19–32)
Calcium: 10.1 mg/dL (ref 8.4–10.5)
Chloride: 104 meq/L (ref 96–112)
Creatinine, Ser: 0.95 mg/dL (ref 0.40–1.20)
GFR: 66.09 mL/min (ref >60.00)
Glucose, Bld: 85 mg/dL (ref 70–99)
Potassium: 3.7 meq/L (ref 3.5–5.1)
Sodium: 145 meq/L (ref 135–145)
Total Bilirubin: 0.6 mg/dL (ref 0.2–1.2)
Total Protein: 7.1 g/dL (ref 6.0–8.3)

## 2024-06-16 LAB — RPR: RPR Ser Ql: NONREACTIVE

## 2024-06-16 LAB — TSH: TSH: 0.91 u[IU]/mL (ref 0.35–5.50)

## 2024-06-17 ENCOUNTER — Ambulatory Visit: Payer: Self-pay | Admitting: Family Medicine

## 2024-06-18 ENCOUNTER — Other Ambulatory Visit: Payer: Self-pay | Admitting: Family Medicine

## 2024-06-18 ENCOUNTER — Ambulatory Visit: Payer: Self-pay

## 2024-06-18 LAB — NUSWAB VAGINITIS PLUS (VG+)
Atopobium vaginae: HIGH {score} — AB
BVAB 2: HIGH {score} — AB
Candida albicans, NAA: NEGATIVE
Candida glabrata, NAA: NEGATIVE
Megasphaera 1: HIGH {score} — AB

## 2024-06-18 MED ORDER — METRONIDAZOLE 500 MG PO TABS: 500.0000 mg | ORAL_TABLET | Freq: Two times a day (BID) | ORAL | 0 refills | Status: AC

## 2024-06-18 NOTE — Telephone Encounter (Signed)
 FYI Only or Action Required?: Action required by provider: update on patient condition.  Patient was last seen in primary care on 06/15/2024 by Antonio Meth, Jamee SAUNDERS, DO.  Called Nurse Triage reporting No chief complaint on file..  Symptoms began today.  Interventions attempted: Nothing.  Symptoms are: unchanged.  Triage Disposition: Call PCP When Office is Open  Patient/caregiver understands and will follow disposition?: YesCopied from CRM [id number]. Topic: Clinical - Medication Question >> Jun 18, 2024  2:01 PM Martinique E wrote: Reason for CRM: Patient has a medication question regarding one that was prescribed at time of visit on 9/15. Patient did not know the name of medication nor what it was to treat. Callback number for patient is [phone]. Reason for Disposition  [1] Caller requesting NON-URGENT health information AND [2] PCP's office is the best resource  Answer Assessment - Initial Assessment Questions 1. REASON FOR CALL: What is the main reason for your call? or How can I best help you? Pt called pharmacy for Flagyl  and they stated they haven't received an order from PCP. Pt is also asking about elevated HSV labs and wants to know if anything needs to be done to address. CAL notified of medication issue. Please advise  Protocols used: Information Only Call - No Triage-A-AH

## 2024-06-18 NOTE — Telephone Encounter (Signed)
 Pt states flagyl  wasn't sent to pharmacy

## 2024-06-20 NOTE — Assessment & Plan Note (Signed)
 Ghm utd Check labs  See AVS Health Maintenance  Topic Date Due   Pneumococcal Vaccine: 50+ Years (1 of 2 - PCV) Never done   Hepatitis B Vaccines 19-59 Average Risk (1 of 3 - 19+ 3-dose series) Never done   Zoster Vaccines- Shingrix  (2 of 2) 05/14/2023   DTaP/Tdap/Td (2 - Td or Tdap) 01/07/2024   COVID-19 Vaccine (3 - 2025-26 season) 06/01/2024   Mammogram  07/24/2024   Cervical Cancer Screening (HPV/Pap Cotest)  03/03/2026   Colonoscopy  02/14/2030   Influenza Vaccine  Completed   Hepatitis C Screening  Completed   HIV Screening  Completed   HPV VACCINES  Aged Out   Meningococcal B Vaccine  Aged Out

## 2024-06-20 NOTE — Assessment & Plan Note (Signed)
 Well controlled, no changes to meds. Encouraged heart healthy diet such as the DASH diet and exercise as tolerated.

## 2024-08-10 ENCOUNTER — Ambulatory Visit: Admitting: Family Medicine

## 2024-08-14 DIAGNOSIS — K50918 Crohn's disease, unspecified, with other complication: Secondary | ICD-10-CM | POA: Diagnosis not present

## 2024-08-14 DIAGNOSIS — K5900 Constipation, unspecified: Secondary | ICD-10-CM | POA: Diagnosis not present

## 2024-08-14 DIAGNOSIS — R109 Unspecified abdominal pain: Secondary | ICD-10-CM | POA: Diagnosis not present

## 2024-08-14 DIAGNOSIS — D259 Leiomyoma of uterus, unspecified: Secondary | ICD-10-CM | POA: Diagnosis not present

## 2024-08-14 DIAGNOSIS — K56699 Other intestinal obstruction unspecified as to partial versus complete obstruction: Secondary | ICD-10-CM | POA: Diagnosis not present

## 2024-08-14 DIAGNOSIS — K449 Diaphragmatic hernia without obstruction or gangrene: Secondary | ICD-10-CM | POA: Diagnosis not present

## 2024-08-14 DIAGNOSIS — R0789 Other chest pain: Secondary | ICD-10-CM | POA: Diagnosis not present

## 2024-08-14 DIAGNOSIS — R1032 Left lower quadrant pain: Secondary | ICD-10-CM | POA: Diagnosis not present

## 2024-08-14 DIAGNOSIS — N1330 Unspecified hydronephrosis: Secondary | ICD-10-CM | POA: Diagnosis not present

## 2024-08-20 ENCOUNTER — Ambulatory Visit: Payer: Self-pay

## 2024-08-20 DIAGNOSIS — R5383 Other fatigue: Secondary | ICD-10-CM | POA: Diagnosis not present

## 2024-08-20 DIAGNOSIS — Z20822 Contact with and (suspected) exposure to covid-19: Secondary | ICD-10-CM | POA: Diagnosis not present

## 2024-08-20 NOTE — Telephone Encounter (Signed)
 FYI Only or Action Required?: FYI only for provider: appointment scheduled on 08/24/24.  Patient was last seen in primary care on 06/15/2024 by Antonio Meth, Jamee SAUNDERS, DO.  Called Nurse Triage reporting Covid Exposure.  Symptoms began several days ago.  Interventions attempted: Rest, hydration, or home remedies.  Symptoms are: stable.  Triage Disposition: See PCP When Office is Open (Within 3 Days)  Patient/caregiver understands and will follow disposition?: Yes        Copied from CRM #[id number]. Topic: Clinical - Red Word Triage >> Aug 20, 2024  8:24 AM Ivette P wrote: Red Word that prompted transfer to Nurse Triage: pt has been around someone who has covid, feeling fatigued. Wants to do covid test. Kms says office visit. Reason for Disposition  [1] MILD weakness (e.g., does not interfere with ability to work, go to school, normal activities) AND [2] persists > 1 week    Triager strongly encouraged OTC COVID/Flu test and to call back with Positive results.  Answer Assessment - Initial Assessment Questions 1. COVID-19 EXPOSURE: Please describe how you were exposed to someone with a COVID-19 infection.     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 PLACE of CONTACT: Where were you when you were exposed to COVID-19? (e.g., home, school, medical waiting room; which city?)     work 3. TYPE of CONTACT: How much contact was there? (e.g., sitting next to, live in same house, work in same office, same building)     Direct contact 4. DURATION of CONTACT: How long were you in contact with the COVID-19 patient? (e.g., a few seconds, passed by person, a few minutes, 15 minutes or longer, live with the patient)     *No Answer* 5. DATE of CONTACT: When did you have contact with a COVID-19 patient? (e.g., hours, days ago)     This week 6. MASK: Were you wearing a mask? Was the other person wearing a mask? Note: wearing a mask reduces the risk of an otherwise close contact.     yes 7.  SYMPTOMS: Do you have any symptoms? (e.g., fever, cough, breathing difficulty, loss of taste or smell)     fatigue 8. COVID-19 VACCINE: Have you had the COVID-19 vaccine? If Yes, ask: When did you last get it?     I think I did 9. PREGNANCY OR POSTPARTUM: Is there any chance you are pregnant? When was your last menstrual period? Did you deliver in the last 2 weeks?     N/a 10. HIGH RISK: Do you have any heart or lung problems? (e.g., asthma, COPD, heart failure) Do you have a weak immune system or other risk factors? (e.g., HIV positive, chemotherapy, renal failure, diabetes mellitus, sickle cell anemia, obesity)       Hx of heart attack 3 years go  Protocols used: COVID-19 - Exposure-A-AH, Weakness (Generalized) and Fatigue-A-AH

## 2024-08-24 ENCOUNTER — Ambulatory Visit: Admitting: Family Medicine

## 2025-01-14 ENCOUNTER — Encounter (HOSPITAL_BASED_OUTPATIENT_CLINIC_OR_DEPARTMENT_OTHER): Payer: 59

## 2025-02-12 ENCOUNTER — Encounter (HOSPITAL_BASED_OUTPATIENT_CLINIC_OR_DEPARTMENT_OTHER): Payer: Medicaid Other
